# Patient Record
Sex: Female | Born: 1985 | Race: Black or African American | Hispanic: No | Marital: Single | Smoking: Current every day smoker
Health system: Southern US, Community
[De-identification: ages and names within clinical notes are randomized; demographics above are authoritative.]

## PROBLEM LIST (undated history)

## (undated) ENCOUNTER — Inpatient Hospital Stay (HOSPITAL_COMMUNITY): Payer: Self-pay

## (undated) DIAGNOSIS — F329 Major depressive disorder, single episode, unspecified: Secondary | ICD-10-CM

## (undated) DIAGNOSIS — IMO0001 Reserved for inherently not codable concepts without codable children: Secondary | ICD-10-CM

## (undated) DIAGNOSIS — D573 Sickle-cell trait: Secondary | ICD-10-CM

## (undated) DIAGNOSIS — G35 Multiple sclerosis: Secondary | ICD-10-CM

## (undated) DIAGNOSIS — O139 Gestational [pregnancy-induced] hypertension without significant proteinuria, unspecified trimester: Secondary | ICD-10-CM

## (undated) DIAGNOSIS — F32A Depression, unspecified: Secondary | ICD-10-CM

## (undated) HISTORY — DX: Reserved for inherently not codable concepts without codable children: IMO0001

## (undated) HISTORY — PX: FOOT SURGERY: SHX648

---
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
## 2006-06-12 ENCOUNTER — Inpatient Hospital Stay (HOSPITAL_COMMUNITY): Admission: AD | Admit: 2006-06-12 | Discharge: 2006-06-12 | Payer: Self-pay | Admitting: Obstetrics

## 2007-05-05 ENCOUNTER — Emergency Department (HOSPITAL_COMMUNITY): Admission: EM | Admit: 2007-05-05 | Discharge: 2007-05-05 | Payer: Self-pay | Admitting: Family Medicine

## 2007-12-21 ENCOUNTER — Emergency Department (HOSPITAL_COMMUNITY): Admission: EM | Admit: 2007-12-21 | Discharge: 2007-12-21 | Payer: Self-pay | Admitting: Emergency Medicine

## 2008-11-30 ENCOUNTER — Inpatient Hospital Stay (HOSPITAL_COMMUNITY): Admission: AD | Admit: 2008-11-30 | Discharge: 2008-11-30 | Payer: Self-pay | Admitting: Obstetrics

## 2009-02-03 ENCOUNTER — Emergency Department (HOSPITAL_COMMUNITY): Admission: EM | Admit: 2009-02-03 | Discharge: 2009-02-03 | Payer: Self-pay | Admitting: Emergency Medicine

## 2009-04-03 ENCOUNTER — Inpatient Hospital Stay (HOSPITAL_COMMUNITY): Admission: AD | Admit: 2009-04-03 | Discharge: 2009-04-03 | Payer: Self-pay | Admitting: Obstetrics

## 2010-05-17 ENCOUNTER — Emergency Department (HOSPITAL_COMMUNITY)
Admission: EM | Admit: 2010-05-17 | Discharge: 2010-05-17 | Disposition: A | Discharge status: Home / Self Care | Payer: Medicaid Other | Attending: Emergency Medicine | Admitting: Emergency Medicine

## 2010-05-17 DIAGNOSIS — N898 Other specified noninflammatory disorders of vagina: Secondary | ICD-10-CM | POA: Insufficient documentation

## 2010-05-17 DIAGNOSIS — R197 Diarrhea, unspecified: Secondary | ICD-10-CM | POA: Insufficient documentation

## 2010-05-17 LAB — DIFFERENTIAL
Basophils Absolute: 0 K/uL (ref 0.0–0.1)
Basophils Relative: 1 % (ref 0–1)
Eosinophils Absolute: 0 10*3/uL (ref 0.0–0.7)
Eosinophils Relative: 1 % (ref 0–5)
Lymphocytes Relative: 30 % (ref 12–46)
Lymphs Abs: 1 K/uL (ref 0.7–4.0)
Monocytes Absolute: 0.3 10*3/uL (ref 0.1–1.0)
Monocytes Relative: 8 % (ref 3–12)
Neutro Abs: 2 K/uL (ref 1.7–7.7)
Neutrophils Relative %: 61 % (ref 43–77)

## 2010-05-17 LAB — COMPREHENSIVE METABOLIC PANEL WITH GFR
ALT: 22 U/L (ref 0–35)
AST: 20 U/L (ref 0–37)
Albumin: 3.8 g/dL (ref 3.5–5.2)
Alkaline Phosphatase: 73 U/L (ref 39–117)
BUN: 9 mg/dL (ref 6–23)
CO2: 23 meq/L (ref 19–32)
Chloride: 106 meq/L (ref 96–112)
Creatinine, Ser: 0.76 mg/dL (ref 0.4–1.2)
GFR calc non Af Amer: >60 mL/min (ref >60)
Glucose, Bld: 94 mg/dL (ref 70–99)
Potassium: 3.7 meq/L (ref 3.5–5.1)
Total Protein: 7 g/dL (ref 6.0–8.3)

## 2010-05-17 LAB — CBC
HCT: 43.5 % (ref 36.0–46.0)
Hemoglobin: 14.7 g/dL (ref 12.0–15.0)
MCH: 29.4 pg (ref 26.0–34.0)
MCHC: 33.8 g/dL (ref 30.0–36.0)
MCV: 87 fL (ref 78.0–100.0)
Platelets: 266 10*3/uL (ref 150–400)
RBC: 5 MIL/uL (ref 3.87–5.11)
RDW: 13.7 % (ref 11.5–15.5)
WBC: 3.2 K/uL — ABNORMAL LOW (ref 4.0–10.5)

## 2010-05-17 LAB — LIPASE, BLOOD: Lipase: 26 U/L (ref 11–59)

## 2010-05-17 LAB — URINALYSIS, ROUTINE W REFLEX MICROSCOPIC
Bilirubin Urine: NEGATIVE
Glucose, UA: NEGATIVE mg/dL
Ketones, ur: NEGATIVE mg/dL
Nitrite: NEGATIVE
Protein, ur: NEGATIVE mg/dL
Specific Gravity, Urine: 1.017 (ref 1.005–1.030)
Urobilinogen, UA: 0.2 mg/dL (ref 0.0–1.0)
pH: 5.5 (ref 5.0–8.0)

## 2010-05-17 LAB — URINE MICROSCOPIC-ADD ON

## 2010-05-17 LAB — COMPREHENSIVE METABOLIC PANEL
Calcium: 8.8 mg/dL (ref 8.4–10.5)
GFR calc Af Amer: >60 mL/min (ref >60)
Sodium: 133 mEq/L — ABNORMAL LOW (ref 135–145)
Total Bilirubin: 0.4 mg/dL (ref 0.3–1.2)

## 2010-05-17 LAB — POCT PREGNANCY, URINE: Preg Test, Ur: NEGATIVE

## 2010-05-19 LAB — URINE CULTURE
Colony Count: 50000
Culture  Setup Time: 201203142047

## 2010-05-21 LAB — URINALYSIS, ROUTINE W REFLEX MICROSCOPIC
Bilirubin Urine: NEGATIVE
Specific Gravity, Urine: 1.015 (ref 1.005–1.030)
Urobilinogen, UA: 0.2 mg/dL (ref 0.0–1.0)

## 2010-05-21 LAB — URINE MICROSCOPIC-ADD ON

## 2010-05-21 LAB — WET PREP, GENITAL
Trich, Wet Prep: NONE SEEN
Yeast Wet Prep HPF POC: NONE SEEN

## 2010-05-21 LAB — GC/CHLAMYDIA PROBE AMP, GENITAL
Chlamydia, DNA Probe: NEGATIVE
GC Probe Amp, Genital: NEGATIVE

## 2010-06-06 LAB — URINALYSIS, ROUTINE W REFLEX MICROSCOPIC
Bilirubin Urine: NEGATIVE
Glucose, UA: NEGATIVE mg/dL
Specific Gravity, Urine: 1.013 (ref 1.005–1.030)
Urobilinogen, UA: 1 mg/dL (ref 0.0–1.0)

## 2010-06-09 LAB — URINALYSIS, ROUTINE W REFLEX MICROSCOPIC
Bilirubin Urine: NEGATIVE
Glucose, UA: NEGATIVE mg/dL
Nitrite: NEGATIVE
Specific Gravity, Urine: 1.015 (ref 1.005–1.030)
pH: 5.5 (ref 5.0–8.0)

## 2010-06-09 LAB — URINE MICROSCOPIC-ADD ON

## 2010-06-09 LAB — WET PREP, GENITAL: Yeast Wet Prep HPF POC: NONE SEEN

## 2010-06-09 LAB — GC/CHLAMYDIA PROBE AMP, GENITAL: GC Probe Amp, Genital: NEGATIVE

## 2010-07-10 ENCOUNTER — Emergency Department (HOSPITAL_COMMUNITY)
Admission: EM | Admit: 2010-07-10 | Discharge: 2010-07-10 | Disposition: A | Discharge status: Home / Self Care | Payer: Medicaid Other | Attending: Emergency Medicine | Admitting: Emergency Medicine

## 2010-07-10 DIAGNOSIS — B9689 Other specified bacterial agents as the cause of diseases classified elsewhere: Secondary | ICD-10-CM | POA: Insufficient documentation

## 2010-07-10 DIAGNOSIS — N760 Acute vaginitis: Secondary | ICD-10-CM | POA: Insufficient documentation

## 2010-07-10 DIAGNOSIS — A499 Bacterial infection, unspecified: Secondary | ICD-10-CM | POA: Insufficient documentation

## 2010-07-10 DIAGNOSIS — B3731 Acute candidiasis of vulva and vagina: Secondary | ICD-10-CM | POA: Insufficient documentation

## 2010-07-10 DIAGNOSIS — B373 Candidiasis of vulva and vagina: Secondary | ICD-10-CM | POA: Insufficient documentation

## 2010-07-10 LAB — POCT PREGNANCY, URINE: Preg Test, Ur: NEGATIVE

## 2010-07-10 LAB — WET PREP, GENITAL: Trich, Wet Prep: NONE SEEN

## 2010-07-21 NOTE — Op Note (Signed)
NAMETELICIA, Cheryl Bright                 ACCOUNT NO.:  [id number]   MEDICAL RECORD NO.:  [id number]          PATIENT TYPE:  INP   LOCATION:  9130                          FACILITY:  [facility]   PHYSICIAN:  Charles Bright. Clearance Coots, M.D.DATE OF BIRTH:  10-22-1985   DATE OF PROCEDURE:  07/01/2004  DATE OF DISCHARGE:                                 OPERATIVE REPORT   PREOPERATIVE DIAGNOSES:  1.  36-week gestation.  2.  Active labor.  3.  Breech presentation.   POSTOPERATIVE DIAGNOSES:  1.  36-week gestation.  2.  Active labor.  3.  Breech presentation.   PROCEDURE:  Primary low transverse cesarean section.   SURGEON:  Charles Bright. Clearance Coots, M.D.   ANESTHESIA:  Spinal.   ESTIMATED BLOOD LOSS:  800 mL.   IV FLUIDS:  2800 mL.   URINE OUTPUT:  200 mL.   COMPLICATIONS:  None.   DRAINS:  Foley to gravity, clear.   FINDINGS:  Viable female at 70 with Apgars of 7 at one minute and 9 at five  minutes, weight of 5 pounds 13 ounces.  Normal uterus, ovaries, and  fallopian tubes.   DESCRIPTION OF PROCEDURE:  The patient was brought to the operating room and  after satisfactory spinal anesthesia, the abdomen was prepped and draped in  the usual sterile fashion.   Bright Pfannenstiel skin incision was made with Bright scalpel that was deepened down  to the fascia with Bright scalpel.  The fascia was nicked in the midline, and the  fascial incision was extended to the left and to the right with Bright pair of  Mayo scissors.  The superior and inferior fascial edges were taken off of  the rectus muscles with blunt and sharp dissection.  The rectus muscles were  then divided in the midline both bluntly and sharply.  The peritoneum was  entered digitally and was digitally extended to the left and to the right.  The bladder blade was positioned, and the vesicouterine fold of peritoneum  above the urinary bladder was grasped with forceps and was incised and  undermined with Metzenbaum scissors.  The incision was extended to the  left  and to the right with the Metzenbaum scissors.  The bladder flap was  developed, and the bladder blade was repositioned in front of the urinary  bladder, placing it well out of the operative field.  The uterus was then  entered in the lower uterine segment transversely with the scalpel.  The  uterine incision was then extended to the left and to the right digitally.  The delivery was then accomplished as Bright frank breech delivery in routine  fashion.  The infant's mouth and nose were suctioned with the suction bulb,  and the umbilical cord was doubly clamped and cut.  The infant was handed  off to the nursery staff.  Cord blood was obtained, and the placenta was  spontaneously expelled from the uterine cavity intact.  The edges of the  uterine incision were grasped with ring forceps, and the uterus was closed  with Bright continuous interlocking suture of 0 Monocryl.  Hemostasis was  excellent.  The pelvic cavity was thoroughly irrigated with warm saline  solution, and all clots were removed.  The abdomen was then closed as  follows.  The peritoneum was closed with continuous suture of 2-0 Vicryl.  The fascia was closed with continuous suture of 0 PDS from each corner to  the center.  The subcutaneous tissue was thoroughly irrigated with warm  saline solution, and all areas of subcutaneous bleeding were coagulated with  the Bovie.  The skin was then closed with Bright continuous subcuticular suture  of 3-0 Monocryl.  Bright sterile bandage was applied to the incision closure.  The surgical technician indicated that all needle, sponge, and instrument  counts were correct x2.   The patient tolerated the procedure well and was transported to the recovery  room in satisfactory condition.      CAH/MEDQ  D:  07/01/2004  T:  07/02/2004  Job:  [id number]

## 2010-07-21 NOTE — Discharge Summary (Signed)
NAME:  Cheryl Bright, Cheryl Bright                           ACCOUNT NO.:  [id number]   MEDICAL RECORD NO.:  [id number]                   PATIENT TYPE:  INP   LOCATION:  6119                                 FACILITY:  [facility]   PHYSICIAN:  Almedia Balls. Ranell Patrick, M.D.              DATE OF BIRTH:  Jan 10, 1986   DATE OF ADMISSION:  10/13/2003  DATE OF DISCHARGE:  10/16/2003                                 DISCHARGE SUMMARY   ADMISSION DIAGNOSIS:  Displaced trimalleolar ankle fracture.   DISCHARGE DIAGNOSIS:  Displaced trimalleolar ankle fracture.   CONSULTING SERVICES:  Physical therapy.   DISCHARGE PLANNING:   HISTORY OF PRESENT ILLNESS:  The patient is a 25 year old female, status  post injury to the patient's right ankle.  The patient sustained an ankle  fracture-dislocation.  She presented to [facility] emergency room where she  had x-rays done, demonstrating a displaced trimalleolar ankle fracture.  For  further details and the patient's past medical history or physical  examination, please see the medical record.   HOSPITAL COURSE:  The patient was admitted to Orthopaedics.  Was taken to  surgery at 1105 for immediate open reduction-internal fixation of the right  ankle fracture.  The patient was taken postoperatively to the floor, where  she had an uncomplicated course.  Discharge planning involved physical  therapy for strict non-weight-bearing of the right lower extremity. She will  follow up with [place] in a week.  She needs to elevate her leg.  She can bathe.  Muscle relaxers.  Instructions to call us for any fevers or chills.                                                Almedia Balls. Ranell Patrick, M.D.    SRN/MEDQ  D:  11/13/2003  T:  11/14/2003  Job:  [id number]

## 2010-07-21 NOTE — Discharge Summary (Signed)
NAMEHARLEE, ECKROTH                 ACCOUNT NO.:  [id number]   MEDICAL RECORD NO.:  [id number]          PATIENT TYPE:  INP   LOCATION:  9101                          FACILITY:  [facility]   PHYSICIAN:  Kathreen Cosier, M.D.DATE OF BIRTH:  [birth year]   DATE OF ADMISSION:  07/01/2004  DATE OF DISCHARGE:                                 DISCHARGE SUMMARY   HOSPITAL COURSE:  The patient is an 25 year old gravida 2 para 1-0-0-1 whose  EDC was Jul 25, 2004. She was then admitted in labor on April 29 at 36 weeks  with a breech presentation and underwent primary low transverse cesarean  section. She had a female, Apgars 7 and 9, weighing 5 pounds 13 ounces. On  admission, her urine was negative, hemoglobin was 10.6, white count 8.1, RPR  was negative. Postoperatively, her hemoglobin was 9.9, platelets 294, white  count 10.9. She did well and was discharged home on postoperative day #3  ambulatory, on a regular diet, to see me in 6 weeks.   DISCHARGE DIAGNOSIS:  Status post primary low transverse cesarean section at  36 weeks for breech presentation in labor.      BAM/MEDQ  D:  07/04/2004  T:  07/04/2004  Job:  [id number]
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
## 2010-09-05 ENCOUNTER — Emergency Department (HOSPITAL_COMMUNITY)
Admission: EM | Admit: 2010-09-05 | Discharge: 2010-09-05 | Disposition: A | Discharge status: Home / Self Care | Payer: Medicaid Other | Attending: Emergency Medicine | Admitting: Emergency Medicine

## 2010-09-05 DIAGNOSIS — A499 Bacterial infection, unspecified: Secondary | ICD-10-CM | POA: Insufficient documentation

## 2010-09-05 DIAGNOSIS — B3731 Acute candidiasis of vulva and vagina: Secondary | ICD-10-CM | POA: Insufficient documentation

## 2010-09-05 DIAGNOSIS — B9689 Other specified bacterial agents as the cause of diseases classified elsewhere: Secondary | ICD-10-CM | POA: Insufficient documentation

## 2010-09-05 DIAGNOSIS — N390 Urinary tract infection, site not specified: Secondary | ICD-10-CM | POA: Insufficient documentation

## 2010-09-05 DIAGNOSIS — N760 Acute vaginitis: Secondary | ICD-10-CM | POA: Insufficient documentation

## 2010-09-05 DIAGNOSIS — B373 Candidiasis of vulva and vagina: Secondary | ICD-10-CM | POA: Insufficient documentation

## 2010-09-05 LAB — URINALYSIS, ROUTINE W REFLEX MICROSCOPIC
Bilirubin Urine: NEGATIVE
Nitrite: NEGATIVE
Specific Gravity, Urine: 1.013 (ref 1.005–1.030)
pH: 7 (ref 5.0–8.0)

## 2010-09-05 LAB — URINE MICROSCOPIC-ADD ON

## 2010-09-05 LAB — WET PREP, GENITAL

## 2010-09-05 LAB — POCT PREGNANCY, URINE: Preg Test, Ur: NEGATIVE

## 2010-09-06 LAB — GC/CHLAMYDIA PROBE AMP, GENITAL: GC Probe Amp, Genital: NEGATIVE

## 2010-11-27 LAB — POCT RAPID STREP A: Streptococcus, Group A Screen (Direct): NEGATIVE

## 2010-12-05 LAB — DIFFERENTIAL
Basophils Absolute: 0
Eosinophils Absolute: 0.1
Eosinophils Relative: 1
Lymphocytes Relative: 13
Monocytes Absolute: 0.8

## 2010-12-05 LAB — URINALYSIS, ROUTINE W REFLEX MICROSCOPIC
Bilirubin Urine: NEGATIVE
Ketones, ur: NEGATIVE
Leukocytes, UA: NEGATIVE
Nitrite: NEGATIVE
Protein, ur: NEGATIVE
pH: 6

## 2010-12-05 LAB — COMPREHENSIVE METABOLIC PANEL
ALT: 12
AST: 14
Albumin: 3.5
Alkaline Phosphatase: 72
CO2: 25
Chloride: 105
GFR calc Af Amer: >60
GFR calc non Af Amer: >60
Potassium: 4
Sodium: 135
Total Bilirubin: 0.6

## 2010-12-05 LAB — URINE MICROSCOPIC-ADD ON

## 2010-12-05 LAB — WET PREP, GENITAL: Trich, Wet Prep: NONE SEEN

## 2010-12-05 LAB — CBC
MCV: 87.4
Platelets: 331
RBC: 4.24
WBC: 11.4 — ABNORMAL HIGH

## 2010-12-05 LAB — POCT PREGNANCY, URINE: Preg Test, Ur: NEGATIVE

## 2010-12-05 LAB — GC/CHLAMYDIA PROBE AMP, GENITAL: Chlamydia, DNA Probe: NEGATIVE

## 2011-07-18 ENCOUNTER — Emergency Department (HOSPITAL_COMMUNITY)
Admission: EM | Admit: 2011-07-18 | Discharge: 2011-07-18 | Discharge status: Against Medical Advice | Payer: Medicaid Other | Attending: Emergency Medicine | Admitting: Emergency Medicine

## 2011-07-18 ENCOUNTER — Encounter (HOSPITAL_COMMUNITY): Payer: Self-pay | Admitting: *Deleted

## 2011-07-18 DIAGNOSIS — R109 Unspecified abdominal pain: Secondary | ICD-10-CM | POA: Insufficient documentation

## 2011-07-18 LAB — URINE MICROSCOPIC-ADD ON

## 2011-07-18 LAB — URINALYSIS, ROUTINE W REFLEX MICROSCOPIC
Bilirubin Urine: NEGATIVE
Ketones, ur: NEGATIVE mg/dL
Specific Gravity, Urine: 1.013 (ref 1.005–1.030)
pH: 7 (ref 5.0–8.0)

## 2011-07-18 NOTE — ED Provider Notes (Signed)
Multiple attempts to locate patient without success.  Unable to evaluate pt as pt not present in room.  Fayrene Helper, PA-C 07/18/11 [id number]

## 2011-07-18 NOTE — ED Notes (Signed)
Attempted to call her cell phone with no answer,  Invalid number

## 2011-07-18 NOTE — ED Notes (Signed)
Name called no answer 

## 2011-07-18 NOTE — ED Notes (Signed)
Rt lower abd pain and she says she has some green drainage from both breasts for 2 days.  lmp april

## 2011-07-19 ENCOUNTER — Encounter (HOSPITAL_COMMUNITY): Payer: Self-pay | Admitting: *Deleted

## 2011-07-19 ENCOUNTER — Inpatient Hospital Stay (HOSPITAL_COMMUNITY)
Admission: AD | Admit: 2011-07-19 | Discharge: 2011-07-19 | Disposition: A | Discharge status: Home / Self Care | Payer: Medicaid Other | Source: Ambulatory Visit | Attending: Obstetrics | Admitting: Obstetrics

## 2011-07-19 DIAGNOSIS — N949 Unspecified condition associated with female genital organs and menstrual cycle: Secondary | ICD-10-CM | POA: Insufficient documentation

## 2011-07-19 DIAGNOSIS — N6452 Nipple discharge: Secondary | ICD-10-CM

## 2011-07-19 DIAGNOSIS — N926 Irregular menstruation, unspecified: Secondary | ICD-10-CM | POA: Insufficient documentation

## 2011-07-19 DIAGNOSIS — N643 Galactorrhea not associated with childbirth: Secondary | ICD-10-CM | POA: Insufficient documentation

## 2011-07-19 DIAGNOSIS — R1031 Right lower quadrant pain: Secondary | ICD-10-CM | POA: Insufficient documentation

## 2011-07-19 DIAGNOSIS — N6459 Other signs and symptoms in breast: Secondary | ICD-10-CM

## 2011-07-19 DIAGNOSIS — R102 Pelvic and perineal pain: Secondary | ICD-10-CM

## 2011-07-19 LAB — URINALYSIS, ROUTINE W REFLEX MICROSCOPIC
Bilirubin Urine: NEGATIVE
Ketones, ur: NEGATIVE mg/dL
Leukocytes, UA: NEGATIVE
Nitrite: NEGATIVE
Protein, ur: NEGATIVE mg/dL
Urobilinogen, UA: 0.2 mg/dL (ref 0.0–1.0)

## 2011-07-19 LAB — POCT PREGNANCY, URINE: Preg Test, Ur: NEGATIVE

## 2011-07-19 LAB — WET PREP, GENITAL: Yeast Wet Prep HPF POC: NONE SEEN

## 2011-07-19 LAB — URINE MICROSCOPIC-ADD ON

## 2011-07-19 MED ORDER — METRONIDAZOLE 500 MG PO TABS
2000.0000 mg | ORAL_TABLET | Freq: Once | ORAL | Status: AC
  Administered 2011-07-19: 2000 mg via ORAL
  Filled 2011-07-19: qty 4

## 2011-07-19 NOTE — MAU Note (Signed)
Both breasts are tender noticed milk like substance from left breast and greenish drainage from right breast, LMP 07/01/11 having some stomach pain.

## 2011-07-19 NOTE — MAU Provider Note (Signed)
History     CSN: [id number]  Arrival date and time: 07/19/11 1048   None     Chief Complaint  Patient presents with  . Breast Pain   HPI 26 y.o. [name] with breast discharge, left breast white, right breast greenish, only comes out with manual expression, slight tenderness in breast, no masses or redness. RLQ abd pain x a few days, comes and goes. Hasn't tried any pain meds, "just move around and try to get my mind off of it". No vaginal bleeding or discharge. Patient's last menstrual period was 06/23/2011. States that periods are irregular, occuring about every 3 months.    Past Medical History  Diagnosis Date  . No pertinent past medical history     Past Surgical History  Procedure Date  . Foot surgery   . Cesarean section     History reviewed. No pertinent family history.  History  Substance Use Topics  . Smoking status: Current Everyday Smoker -- 0.2 packs/day  . Smokeless tobacco: Not on file  . Alcohol Use: Yes     several times per month    Allergies:  Allergies  Allergen Reactions  . Sulfa Drugs Cross Reactors Other (See Comments)    Swelling and hives    Prescriptions prior to admission  Medication Sig Dispense Refill  . Multiple Vitamin (MULITIVITAMIN WITH MINERALS) TABS Take 1 tablet by mouth daily.        Review of Systems  Constitutional: Negative.   Respiratory: Negative.   Cardiovascular: Negative.   Gastrointestinal: Positive for abdominal pain. Negative for nausea, vomiting, diarrhea and constipation.  Genitourinary: Negative for dysuria, urgency, frequency, hematuria and flank pain.       Negative for vaginal bleeding, vaginal discharge, dyspareunia  Musculoskeletal: Negative.   Neurological: Negative.   Psychiatric/Behavioral: Negative.    Physical Exam   Blood pressure 136/79, pulse 75, temperature 99.4 F (37.4 C), temperature source Oral, resp. rate 16, height 5\' 2"  (1.575 m), weight 208 lb 6.4 oz (94.53 kg), last menstrual  period 06/23/2011.  Physical Exam  Nursing note and vitals reviewed. Constitutional: She is oriented to person, place, and time. She appears well-developed and well-nourished. No distress.  Cardiovascular: Normal rate.   Respiratory: Effort normal.  GI: Soft. She exhibits no mass. There is tenderness (bilateral lower quadrants). There is no rebound and no guarding.  Genitourinary: There is breast discharge (able to express drop of milky fluid from left breast). No breast swelling, tenderness or bleeding.  Musculoskeletal: Normal range of motion.  Neurological: She is alert and oriented to person, place, and time.  Skin: Skin is warm and dry.       Facial hirsutism   Psychiatric: She has a normal mood and affect.    MAU Course  Procedures Results for orders placed during the hospital encounter of 07/19/11 (from the past 24 hour(s))  URINALYSIS, ROUTINE W REFLEX MICROSCOPIC     Status: Abnormal   Collection Time   07/19/11 11:00 AM      Component Value Range   Color, Urine YELLOW  YELLOW    APPearance CLEAR  CLEAR    Specific Gravity, Urine 1.015  1.005 - 1.030    pH 6.0  5.0 - 8.0    Glucose, UA NEGATIVE  NEGATIVE (mg/dL)   Hgb urine dipstick SMALL (*) NEGATIVE    Bilirubin Urine NEGATIVE  NEGATIVE    Ketones, ur NEGATIVE  NEGATIVE (mg/dL)   Protein, ur NEGATIVE  NEGATIVE (mg/dL)   Urobilinogen, UA 0.2  0.0 - 1.0 (mg/dL)   Nitrite NEGATIVE  NEGATIVE    Leukocytes, UA NEGATIVE  NEGATIVE   URINE MICROSCOPIC-ADD ON     Status: Abnormal   Collection Time   07/19/11 11:00 AM      Component Value Range   Squamous Epithelial / LPF FEW (*) RARE    WBC, UA 0-2  <3 (WBC/hpf)   RBC / HPF 3-6  <3 (RBC/hpf)   Bacteria, UA MANY (*) RARE   POCT PREGNANCY, URINE     Status: Normal   Collection Time   07/19/11 11:10 AM      Component Value Range   Preg Test, Ur NEGATIVE  NEGATIVE   WET PREP, GENITAL     Status: Abnormal   Collection Time   07/19/11 11:45 AM      Component Value Range    Yeast Wet Prep HPF POC NONE SEEN  NONE SEEN    Trich, Wet Prep FEW (*) NONE SEEN    Clue Cells Wet Prep HPF POC FEW (*) NONE SEEN    WBC, Wet Prep HPF POC FEW (*) NONE SEEN    Pt declines any pain medication at this time  Assessment and Plan  26 y.o. [id number] with galactorrhea, pelvic pain, irregular periods Trich on wet prep today - treated with Flagyl 2000 mg in MAU F/U with Dr. Gaynell Face for further eval  Cheryl Bright 07/19/2011, 12:33 PM

## 2011-07-19 NOTE — ED Provider Notes (Signed)
Medical screening examination/treatment/procedure(s) were performed by non-physician practitioner and as supervising physician I was immediately available for consultation/collaboration.  Juliet Rude. Rubin Payor, MD 07/19/11 1610

## 2011-07-20 LAB — GC/CHLAMYDIA PROBE AMP, GENITAL: Chlamydia, DNA Probe: NEGATIVE

## 2011-07-20 NOTE — MAU Provider Note (Signed)
Agree with above note.  Bridget Petroleum 07/20/2011 9:02 AM   

## 2011-11-07 ENCOUNTER — Encounter (HOSPITAL_COMMUNITY): Payer: Self-pay | Admitting: *Deleted

## 2011-11-07 ENCOUNTER — Emergency Department (HOSPITAL_COMMUNITY)
Admission: EM | Admit: 2011-11-07 | Discharge: 2011-11-07 | Disposition: A | Discharge status: Home / Self Care | Payer: Medicaid Other | Attending: Emergency Medicine | Admitting: Emergency Medicine

## 2011-11-07 DIAGNOSIS — M549 Dorsalgia, unspecified: Secondary | ICD-10-CM

## 2011-11-07 DIAGNOSIS — F17200 Nicotine dependence, unspecified, uncomplicated: Secondary | ICD-10-CM | POA: Insufficient documentation

## 2011-11-07 DIAGNOSIS — Z882 Allergy status to sulfonamides status: Secondary | ICD-10-CM | POA: Insufficient documentation

## 2011-11-07 MED ORDER — IBUPROFEN 800 MG PO TABS: 800.0000 mg | ORAL_TABLET | Freq: Three times a day (TID) | ORAL | Status: DC

## 2011-11-07 NOTE — ED Provider Notes (Signed)
Medical screening examination/treatment/procedure(s) were performed by non-physician practitioner and as supervising physician I was immediately available for consultation/collaboration.   Henryk Ursin, MD 11/07/11 1508 
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
## 2011-11-08 ENCOUNTER — Emergency Department (HOSPITAL_COMMUNITY)
Admission: EM | Admit: 2011-11-08 | Discharge: 2011-11-08 | Disposition: A | Discharge status: Home / Self Care | Payer: No Typology Code available for payment source | Attending: Emergency Medicine | Admitting: Emergency Medicine

## 2011-11-08 ENCOUNTER — Encounter (HOSPITAL_COMMUNITY): Payer: Self-pay | Admitting: Emergency Medicine

## 2011-11-08 DIAGNOSIS — I4902 Ventricular flutter: Secondary | ICD-10-CM | POA: Insufficient documentation

## 2011-11-08 NOTE — ED Notes (Signed)
EKG given to Dr. Sheldon 

## 2011-11-08 NOTE — ED Notes (Signed)
Pt not in waiting room

## 2011-11-08 NOTE — ED Notes (Signed)
Heart started fluttering yesterday -- was not doing anything that was different. A/Ox3, w/d, no apparent distress.

## 2012-06-11 ENCOUNTER — Emergency Department (HOSPITAL_COMMUNITY): Payer: Medicaid Other

## 2012-06-11 ENCOUNTER — Emergency Department (HOSPITAL_COMMUNITY)
Admission: EM | Admit: 2012-06-11 | Discharge: 2012-06-11 | Disposition: A | Discharge status: Home / Self Care | Payer: Medicaid Other | Attending: Emergency Medicine | Admitting: Emergency Medicine

## 2012-06-11 ENCOUNTER — Encounter (HOSPITAL_COMMUNITY): Payer: Self-pay

## 2012-06-11 DIAGNOSIS — Y9301 Activity, walking, marching and hiking: Secondary | ICD-10-CM | POA: Insufficient documentation

## 2012-06-11 DIAGNOSIS — S93409A Sprain of unspecified ligament of unspecified ankle, initial encounter: Secondary | ICD-10-CM | POA: Insufficient documentation

## 2012-06-11 DIAGNOSIS — Z9889 Other specified postprocedural states: Secondary | ICD-10-CM | POA: Insufficient documentation

## 2012-06-11 DIAGNOSIS — F17200 Nicotine dependence, unspecified, uncomplicated: Secondary | ICD-10-CM | POA: Insufficient documentation

## 2012-06-11 DIAGNOSIS — X500XXA Overexertion from strenuous movement or load, initial encounter: Secondary | ICD-10-CM | POA: Insufficient documentation

## 2012-06-11 DIAGNOSIS — S93402A Sprain of unspecified ligament of left ankle, initial encounter: Secondary | ICD-10-CM

## 2012-06-11 DIAGNOSIS — Y92009 Unspecified place in unspecified non-institutional (private) residence as the place of occurrence of the external cause: Secondary | ICD-10-CM | POA: Insufficient documentation

## 2012-06-11 MED ORDER — IBUPROFEN 800 MG PO TABS: 800.0000 mg | ORAL_TABLET | Freq: Four times a day (QID) | ORAL | Status: DC | PRN

## 2012-06-11 MED ORDER — OXYCODONE-ACETAMINOPHEN 5-325 MG PO TABS
1.0000 | ORAL_TABLET | Freq: Once | ORAL | Status: AC
  Administered 2012-06-11: 1 via ORAL
  Filled 2012-06-11: qty 1

## 2012-06-11 MED ORDER — IBUPROFEN 800 MG PO TABS
800.0000 mg | ORAL_TABLET | Freq: Once | ORAL | Status: AC
  Administered 2012-06-11: 800 mg via ORAL
  Filled 2012-06-11: qty 1

## 2012-06-11 MED ORDER — HYDROCODONE-IBUPROFEN 5-200 MG PO TABS: 1.0000 | ORAL_TABLET | Freq: Three times a day (TID) | ORAL | Status: DC | PRN

## 2012-06-11 NOTE — ED Provider Notes (Signed)
Medical screening examination/treatment/procedure(s) were performed by non-physician practitioner and as supervising physician I was immediately available for consultation/collaboration.  Sunnie Nielsen, MD 06/11/12 [phone]

## 2012-06-11 NOTE — ED Provider Notes (Signed)
History     CSN: [id number]  Arrival date & time 06/11/12  0143   First MD Initiated Contact with Patient 06/11/12 [phone]      Chief Complaint  Patient presents with  . Ankle Pain    (Consider location/radiation/quality/duration/timing/severity/associated sxs/prior treatment) HPI Comments: Patient states she was walking outside her home.  When she twisted her left ankle, falling is any other injuries.  Left ankle is painful, and swollen.  She has not taken any over-the-counter medication.  Prior to arrival.  For discomfort.  No previous history of ankle injury  Patient is a 27 y.o. female presenting with ankle pain. The history is provided by the patient.  Ankle Pain Location:  Ankle Time since incident:  4 hours Injury: yes   Mechanism of injury: fall   Fall:    Fall occurred:  Walking Associated symptoms: no fever     Past Medical History  Diagnosis Date  . No pertinent past medical history     Past Surgical History  Procedure Laterality Date  . Foot surgery    . Cesarean section      History reviewed. No pertinent family history.  History  Substance Use Topics  . Smoking status: Current Every Day Smoker -- 0.25 packs/day  . Smokeless tobacco: Not on file  . Alcohol Use: Yes     Comment: several times per month    OB History   Grav Para Term Preterm Abortions TAB SAB Ect Mult Living   3 2 2  1  1   2       Review of Systems  Constitutional: Negative for fever.  Musculoskeletal: Positive for joint swelling.  Skin: Negative for wound.  All other systems reviewed and are negative.    Allergies  Sulfa drugs cross reactors  Home Medications   Current Outpatient Rx  Name  Route  Sig  Dispense  Refill  . hydrocodone-ibuprofen (VICOPROFEN) 5-200 MG per tablet   Oral   Take 1 tablet by mouth every 8 (eight) hours as needed for pain.   30 tablet   0   . ibuprofen (ADVIL,MOTRIN) 800 MG tablet   Oral   Take 1 tablet (800 mg total) by mouth every 6 (six)  hours as needed for pain.   30 tablet   0   . Multiple Vitamin (MULITIVITAMIN WITH MINERALS) TABS   Oral   Take 1 tablet by mouth daily.           BP 127/80  Pulse 108  Temp(Src) 98.1 F (36.7 C) (Oral)  Resp 18  Ht 5\' 2"  (1.575 m)  Wt 215 lb (97.523 kg)  BMI 39.31 kg/m2  SpO2 99%  LMP 04/13/2012  Physical Exam  Nursing note and vitals reviewed. Constitutional: She is oriented to person, place, and time. She appears well-developed and well-nourished.  HENT:  Head: Normocephalic.  Eyes: Pupils are equal, round, and reactive to light.  Neck: Normal range of motion.  Cardiovascular: Normal rate.   Pulmonary/Chest: Effort normal.  Musculoskeletal: Normal range of motion.  Neurological: She is alert and oriented to person, place, and time.  Skin: Skin is warm.    ED Course  Procedures (including critical care time)  Labs Reviewed - No data to display Dg Ankle Complete Left  06/11/2012  *RADIOLOGY REPORT*  Clinical Data: Twisting injury to left ankle, with lateral ankle pain and swelling.  LEFT ANKLE COMPLETE - 3+ VIEW  Comparison: None  Findings: There is no evidence of fracture or dislocation.  The ankle mortise is intact; the interosseous space is within normal limits.  No talar tilt or subluxation is seen.  The joint spaces are preserved.  Mild lateral soft tissue swelling is noted.  IMPRESSION: No evidence of fracture or dislocation.   Original Report Authenticated By: Tonia Ghent, M.D.      1. Left ankle sprain, initial encounter       MDM  X-ray, reviewed.  There is no fracture or subluxation.  Patient was placed in an ASO and crutches.  Referral to orthopedics to monitor healing.  Pain control         Arman Filter, NP 06/11/12 0330  Arman Filter, NP 06/11/12 0330  Arman Filter, NP 06/11/12 0330

## 2012-06-11 NOTE — ED Notes (Signed)
Pt states she was coming outside of home and twisted ankle while falling.  Pt states no other injuries.  Pt left ankle is swollen.

## 2012-08-09 ENCOUNTER — Encounter (HOSPITAL_COMMUNITY): Payer: Self-pay | Admitting: *Deleted

## 2012-08-09 ENCOUNTER — Inpatient Hospital Stay (HOSPITAL_COMMUNITY)
Admission: AD | Admit: 2012-08-09 | Discharge: 2012-08-09 | Disposition: A | Discharge status: Home / Self Care | Payer: Medicaid Other | Source: Ambulatory Visit | Attending: Family Medicine | Admitting: Family Medicine

## 2012-08-09 DIAGNOSIS — A499 Bacterial infection, unspecified: Secondary | ICD-10-CM

## 2012-08-09 DIAGNOSIS — B9689 Other specified bacterial agents as the cause of diseases classified elsewhere: Secondary | ICD-10-CM | POA: Insufficient documentation

## 2012-08-09 DIAGNOSIS — N911 Secondary amenorrhea: Secondary | ICD-10-CM

## 2012-08-09 DIAGNOSIS — R109 Unspecified abdominal pain: Secondary | ICD-10-CM | POA: Insufficient documentation

## 2012-08-09 DIAGNOSIS — N760 Acute vaginitis: Secondary | ICD-10-CM | POA: Insufficient documentation

## 2012-08-09 DIAGNOSIS — N912 Amenorrhea, unspecified: Secondary | ICD-10-CM | POA: Insufficient documentation

## 2012-08-09 LAB — URINALYSIS, ROUTINE W REFLEX MICROSCOPIC
Bilirubin Urine: NEGATIVE
Glucose, UA: NEGATIVE mg/dL
Ketones, ur: NEGATIVE mg/dL
Nitrite: NEGATIVE
Protein, ur: NEGATIVE mg/dL
pH: 5.5 (ref 5.0–8.0)

## 2012-08-09 LAB — WET PREP, GENITAL
Trich, Wet Prep: NONE SEEN
Yeast Wet Prep HPF POC: NONE SEEN

## 2012-08-09 LAB — URINE MICROSCOPIC-ADD ON

## 2012-08-09 LAB — CBC
Platelets: 290 10*3/uL (ref 150–400)
RBC: 4.65 MIL/uL (ref 3.87–5.11)
RDW: 14 % (ref 11.5–15.5)
WBC: 7.4 10*3/uL (ref 4.0–10.5)

## 2012-08-09 MED ORDER — METRONIDAZOLE 500 MG PO TABS: 500.0000 mg | ORAL_TABLET | Freq: Two times a day (BID) | ORAL | Status: DC

## 2012-08-09 NOTE — MAU Provider Note (Signed)
History     CSN: [id number]  Arrival date and time: 08/09/12 1140   None     Chief Complaint  Patient presents with  . Abdominal Pain   HPI 27 y.o. [name] with low abd pain since March. Pain is crampy and intermittent, but occurs daily, throughout each day. No bleeding or discharge. Patient's last menstrual period was 04/21/2012. H/O irregular periods, sometimes goes up to 3 months with no period.    Past Medical History  Diagnosis Date  . No pertinent past medical history     Past Surgical History  Procedure Laterality Date  . Foot surgery    . Cesarean section      No family history on file.  History  Substance Use Topics  . Smoking status: Current Every Day Smoker -- 0.25 packs/day  . Smokeless tobacco: Not on file  . Alcohol Use: Yes     Comment: several times per month    Allergies:  Allergies  Allergen Reactions  . Sulfa Drugs Cross Reactors Hives and Swelling    No prescriptions prior to admission    Review of Systems  Constitutional: Negative.   Respiratory: Negative.   Cardiovascular: Negative.   Gastrointestinal: Positive for abdominal pain. Negative for nausea, vomiting, diarrhea and constipation.  Genitourinary: Negative for dysuria, urgency, frequency, hematuria and flank pain.       Negative for vaginal bleeding, vaginal discharge, dyspareunia  Musculoskeletal: Negative.   Neurological: Negative.   Psychiatric/Behavioral: Negative.    Physical Exam   Blood pressure 133/80, pulse 70, temperature 98.1 F (36.7 C), resp. rate 18, height 5\' 2"  (1.575 m), weight 214 lb 9.6 oz (97.342 kg), last menstrual period 04/21/2012.  Physical Exam  Nursing note and vitals reviewed. Constitutional: She is oriented to person, place, and time. She appears well-developed and well-nourished. No distress.  HENT:  Head: Normocephalic and atraumatic.  Cardiovascular: Normal rate.   Respiratory: Effort normal. No respiratory distress.  GI: Soft. Bowel  sounds are normal. She exhibits no distension and no mass. There is no tenderness. There is no rebound and no guarding.  Genitourinary: There is no rash or lesion on the right labia. There is no rash or lesion on the left labia. Uterus is not deviated, not enlarged, not fixed and not tender. Cervix exhibits no motion tenderness, no discharge and no friability. Right adnexum displays no mass, no tenderness and no fullness. Left adnexum displays no mass, no tenderness and no fullness. No erythema, tenderness or bleeding around the vagina. Vaginal discharge (white) found.  Neurological: She is alert and oriented to person, place, and time.  Skin: Skin is warm and dry.  Psychiatric: She has a normal mood and affect.    MAU Course  Procedures Results for orders placed during the hospital encounter of 08/09/12 (from the past 72 hour(s))  URINALYSIS, ROUTINE W REFLEX MICROSCOPIC     Status: Abnormal   Collection Time    08/09/12 11:50 AM      Result Value Range   Color, Urine YELLOW  YELLOW   APPearance CLEAR  CLEAR   Specific Gravity, Urine 1.025  1.005 - 1.030   pH 5.5  5.0 - 8.0   Glucose, UA NEGATIVE  NEGATIVE mg/dL   Hgb urine dipstick MODERATE (*) NEGATIVE   Bilirubin Urine NEGATIVE  NEGATIVE   Ketones, ur NEGATIVE  NEGATIVE mg/dL   Protein, ur NEGATIVE  NEGATIVE mg/dL   Urobilinogen, UA 0.2  0.0 - 1.0 mg/dL   Nitrite NEGATIVE  NEGATIVE  Leukocytes, UA TRACE (*) NEGATIVE  URINE MICROSCOPIC-ADD ON     Status: Abnormal   Collection Time    08/09/12 11:50 AM      Result Value Range   Squamous Epithelial / LPF FEW (*) RARE   WBC, UA 0-2  <3 WBC/hpf   RBC / HPF 3-6  <3 RBC/hpf  POCT PREGNANCY, URINE     Status: None   Collection Time    08/09/12 12:00 PM      Result Value Range   Preg Test, Ur NEGATIVE  NEGATIVE   Comment:            THE SENSITIVITY OF THIS     METHODOLOGY IS >24 mIU/mL  CBC     Status: None   Collection Time    08/09/12 12:19 PM      Result Value Range   WBC  7.4  4.0 - 10.5 K/uL   RBC 4.65  3.87 - 5.11 MIL/uL   Hemoglobin 13.9  12.0 - 15.0 g/dL   HCT 16.1  09.6 - 04.5 %   MCV 86.0  78.0 - 100.0 fL   MCH 29.9  26.0 - 34.0 pg   MCHC 34.8  30.0 - 36.0 g/dL   RDW 40.9  81.1 - 91.4 %   Platelets 290  150 - 400 K/uL  WET PREP, GENITAL     Status: Abnormal   Collection Time    08/09/12 12:25 PM      Result Value Range   Yeast Wet Prep HPF POC NONE SEEN  NONE SEEN   Trich, Wet Prep NONE SEEN  NONE SEEN   Clue Cells Wet Prep HPF POC MODERATE (*) NONE SEEN   WBC, Wet Prep HPF POC FEW (*) NONE SEEN   Comment: MODERATE BACTERIA SEEN     Assessment and Plan   1. BV (bacterial vaginosis)   2. Amenorrhea, secondary   Flagyl for BV F/u in clinic regarding amenorrhea     Medication List    TAKE these medications       ibuprofen 800 MG tablet  Commonly known as:  ADVIL,MOTRIN  Take 1 tablet (800 mg total) by mouth every 6 (six) hours as needed for pain.     metroNIDAZOLE 500 MG tablet  Commonly known as:  FLAGYL  Take 1 tablet (500 mg total) by mouth 2 (two) times daily.     multivitamin with minerals Tabs  Take 1 tablet by mouth daily.        Follow-up Information   Follow up with [facility] In 4 weeks. (someone will call to schedule)    Contact information:   [address] [place] [address] [phone]        [facility] 08/09/2012, 2:12 PM

## 2012-08-09 NOTE — MAU Note (Signed)
Pt reports she has had abd pain and off for several months. Not had a period since Feb. Pregnancy test negative.

## 2012-08-11 NOTE — MAU Provider Note (Signed)
Chart reviewed and agree with management and plan.  

## 2012-08-18 ENCOUNTER — Encounter: Payer: Self-pay | Admitting: Family Medicine

## 2012-09-15 ENCOUNTER — Encounter: Payer: Self-pay | Admitting: Obstetrics and Gynecology

## 2012-09-15 ENCOUNTER — Ambulatory Visit (INDEPENDENT_AMBULATORY_CARE_PROVIDER_SITE_OTHER): Payer: Medicaid Other | Admitting: Obstetrics and Gynecology

## 2012-09-15 VITALS — BP 127/86 | HR 67 | Temp 97.0°F | Ht 62.0 in | Wt 214.8 lb

## 2012-09-15 DIAGNOSIS — E282 Polycystic ovarian syndrome: Secondary | ICD-10-CM

## 2012-09-15 HISTORY — DX: Polycystic ovarian syndrome: E28.2

## 2012-09-15 MED ORDER — METFORMIN HCL 500 MG PO TABS: 500.0000 mg | ORAL_TABLET | Freq: Two times a day (BID) | ORAL | Status: DC

## 2012-09-15 NOTE — Progress Notes (Signed)
CC: Amenorrhea   HPI Cheryl Bright is a 27 y.o. [id number]  who presents with For evaluation of oligomenorrhea. She states she had regular menses until 2007 when she stopped taking oral contraceptives after being on them for only a few months. She often goes 2-3 months without a period and denies having heavy bleeding. She has some facial hirsutism and shaves face. Say she was seen by Dr. Gaynell Face sometime during last year due to single episode of nipple discharge and had a negative prolactin test at that time. LMP was 04/24/2012. No intermenstrual bleeding. Urine pregnancy test negative 08/11/2012 when she was seen in maternity admissions. She at that time had lower abdominal pain and was treated for BV. No contraception and feels it would be okay if she does get pregnant. All Paps normal and states the last was last year with Dr. Gaynell Face.  Past Medical History  Diagnosis Date  . No pertinent past medical history     OB History   Grav Para Term Preterm Abortions TAB SAB Ect Mult Living   3 2 2  1  1   2      # Outc Date GA Lbr Len/2nd Wgt Sex Del Anes PTL Lv   1 TRM 6/03    M SVD EPI  Yes   2 TRM 4/06    M LTCS EPI  Yes   3 SAB               Past Surgical History  Procedure Laterality Date  . Foot surgery    . Cesarean section      History   Social History  . Marital Status: Single    Spouse Name: N/A    Number of Children: N/A  . Years of Education: N/A   Occupational History  . Not on file.   Social History Main Topics  . Smoking status: Current Every Day Smoker -- 0.25 packs/day  . Smokeless tobacco: Not on file  . Alcohol Use: Yes     Comment: several times per month  . Drug Use: No  . Sexually Active:    Other Topics Concern  . Not on file   Social History Narrative  . No narrative on file    Current Outpatient Prescriptions on File Prior to Visit  Medication Sig Dispense Refill  . Multiple Vitamin (MULITIVITAMIN WITH MINERALS) TABS Take 1 tablet by mouth  daily.      Marland Kitchen ibuprofen (ADVIL,MOTRIN) 800 MG tablet Take 1 tablet (800 mg total) by mouth every 6 (six) hours as needed for pain.  30 tablet  0  . metroNIDAZOLE (FLAGYL) 500 MG tablet Take 1 tablet (500 mg total) by mouth 2 (two) times daily.  14 tablet  0   No current facility-administered medications on file prior to visit.    Allergies  Allergen Reactions  . Sulfa Drugs Cross Reactors Hives and Swelling    ROS Pertinent items in HPI. 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 Macon Large: Metformin 500 qd x 1 wk then 1000 mg qd x 1 wk, then 1500 mg qd F/U  3 months.    Danae Orleans, CNM 09/15/2012 4:11 PM

## 2012-09-15 NOTE — Patient Instructions (Signed)
Polycystic Ovarian Syndrome Polycystic ovarian syndrome is a condition with a number of problems. One problem is with the ovaries. The ovaries are organs located in the female pelvis, on each side of the uterus. Usually, during the menstrual cycle, an egg is released from 1 ovary every month. This is called ovulation. When the egg is fertilized, it goes into the womb (uterus), which allows for the growth of a baby. The egg travels from the ovary through the fallopian tube to the uterus. The ovaries also make the hormones estrogen and progesterone. These hormones help the development of a woman's breasts, body shape, and body hair. They also regulate the menstrual cycle and pregnancy. Sometimes, cysts form in the ovaries. A cyst is a fluid-filled sac. On the ovary, different types of cysts can form. The most common type of ovarian cyst is called a functional or ovulation cyst. It is normal, and often forms during the normal menstrual cycle. Each month, a woman's ovaries grow tiny cysts that hold the eggs. When an egg is fully grown, the sac breaks open. This releases the egg. Then, the sac which released the egg from the ovary dissolves. In one type of functional cyst, called a follicle cyst, the sac does not break open to release the egg. It may actually continue to grow. This type of cyst usually disappears within 1 to 3 months.  One type of cyst problem with the ovaries is called Polycystic Ovarian Syndrome (PCOS). In this condition, many follicle cysts form, but do not rupture and produce an egg. This health problem can affect the following:  Menstrual cycle.  Heart.  Obesity.  Cancer of the uterus.  Fertility.  Blood vessels.  Hair growth (face and body) or baldness.  Hormones.  Appearance.  High blood pressure.  Stroke.  Insulin production.  Inflammation of the liver.  Elevated blood cholesterol and triglycerides. CAUSES   No one knows the exact cause of PCOS.  Women with  PCOS often have a mother or sister with PCOS. There is not yet enough proof to say this is inherited.  Many women with PCOS have a weight problem.  Researchers are looking at the relationship between PCOS and the body's ability to make insulin. Insulin is a hormone that regulates the change of sugar, starches, and other food into energy for the body's use, or for storage. Some women with PCOS make too much insulin. It is possible that the ovaries react by making too many female hormones, called androgens. This can lead to acne, excessive hair growth, weight gain, and ovulation problems.  Too much production of luteinizing hormone (LH) from the pituitary gland in the brain stimulates the ovary to produce too much female hormone (androgen). SYMPTOMS   Infrequent or no menstrual periods, and/or irregular bleeding.  Inability to get pregnant (infertility), because of not ovulating.  Increased growth of hair on the face, chest, stomach, back, thumbs, thighs, or toes.  Acne, oily skin, or dandruff.  Pelvic pain.  Weight gain or obesity, usually carrying extra weight around the waist.  Type 2 diabetes (this is the diabetes that usually does not need insulin).  High cholesterol.  High blood pressure.  Female-pattern baldness or thinning hair.  Patches of thickened and dark brown or black skin on the neck, arms, breasts, or thighs.  Skin tags, or tiny excess flaps of skin, in the armpits or neck area.  Sleep apnea (excessive snoring and breathing stops at times while asleep).  Deepening of the voice.  Gestational diabetes when pregnant.  Increased risk of miscarriage with pregnancy. DIAGNOSIS  There is no single test to diagnose PCOS.   Your caregiver will:  Take a medical history.  Perform a pelvic exam.  Perform an ultrasound.  Check your female and female hormone levels.  Measure glucose or sugar levels in the blood.  Do other blood tests.  If you are producing too many  female hormones, your caregiver will make sure it is from PCOS. At the physical exam, your caregiver will want to evaluate the areas of increased hair growth. Try to allow natural hair growth for a few days before the visit.  During a pelvic exam, the ovaries may be enlarged or swollen by the increased number of small cysts. This can be seen more easily by vaginal ultrasound or screening, to examine the ovaries and lining of the uterus (endometrium) for cysts. The uterine lining may become thicker, if there has not been a regular period. TREATMENT  Because there is no cure for PCOS, it needs to be managed to prevent problems. Treatments are based on your symptoms. Treatment is also based on whether you want to have a baby or whether you need contraception.  Treatment may include:  Progesterone hormone, to start a menstrual period.  Birth control pills, to make you have regular menstrual periods.  Medicines to make you ovulate, if you want to get pregnant.  Medicines to control your insulin.  Medicine to control your blood pressure.  Medicine and diet, to control your high cholesterol and triglycerides in your blood.  Surgery, making small holes in the ovary, to decrease the amount of female hormone production. This is done through a long, lighted tube (laparoscope), placed into the pelvis through a tiny incision in the lower abdomen. Your caregiver will go over some of the choices with you. WOMEN WITH PCOS HAVE THESE CHARACTERISTICS:  High levels of female hormones called androgens.  An irregular or no menstrual cycle.  May have many small cysts in their ovaries. PCOS is the most common hormonal reproductive problem in women of childbearing age. WHY DO WOMEN WITH PCOS HAVE TROUBLE WITH THEIR MENSTRUAL CYCLE? Each month, about 20 eggs start to mature in the ovaries. As one egg grows and matures, the follicle breaks open to release the egg, so it can travel through the fallopian tube for  fertilization. When the single egg leaves the follicle, ovulation takes place. In women with PCOS, the ovary does not make all of the hormones it needs for any of the eggs to fully mature. They may start to grow and accumulate fluid, but no one egg becomes large enough. Instead, some may remain as cysts. Since no egg matures or is released, ovulation does not occur and the hormone progesterone is not made. Without progesterone, a woman's menstrual cycle is irregular or absent. Also, the cysts produce female hormones, which continue to prevent ovulation.  Document Released: 06/15/2004 Document Revised: 05/14/2011 Document Reviewed: 01/07/2009 [facility] Patient Information 2014 [place], [place].

## 2012-09-15 NOTE — Addendum Note (Signed)
Addended by: Franchot Mimes on: 09/15/2012 04:36 PM   Modules accepted: Orders

## 2012-11-02 ENCOUNTER — Emergency Department (HOSPITAL_COMMUNITY)
Admission: EM | Admit: 2012-11-02 | Discharge: 2012-11-02 | Disposition: A | Discharge status: Home / Self Care | Payer: Medicaid Other | Attending: Emergency Medicine | Admitting: Emergency Medicine

## 2012-11-02 ENCOUNTER — Encounter (HOSPITAL_COMMUNITY): Payer: Self-pay | Admitting: Emergency Medicine

## 2012-11-02 DIAGNOSIS — R21 Rash and other nonspecific skin eruption: Secondary | ICD-10-CM

## 2012-11-02 DIAGNOSIS — F17200 Nicotine dependence, unspecified, uncomplicated: Secondary | ICD-10-CM | POA: Insufficient documentation

## 2012-11-02 MED ORDER — HYDROXYZINE HCL 25 MG PO TABS: 25.0000 mg | ORAL_TABLET | Freq: Four times a day (QID) | ORAL | Status: DC

## 2012-11-02 MED ORDER — PREDNISONE 20 MG PO TABS: ORAL_TABLET | ORAL | Status: DC

## 2012-11-02 MED ORDER — PREDNISONE 20 MG PO TABS
60.0000 mg | ORAL_TABLET | Freq: Once | ORAL | Status: AC
  Administered 2012-11-02: 60 mg via ORAL
  Filled 2012-11-02: qty 3

## 2012-11-02 NOTE — ED Provider Notes (Signed)
Medical screening examination/treatment/procedure(s) were performed by non-physician practitioner and as supervising physician I was immediately available for consultation/collaboration.  Paulyne Mooty L Khamya Topp, MD 11/02/12 2324 

## 2012-11-02 NOTE — ED Provider Notes (Signed)
CSN: [id number]     Arrival date & time 11/02/12  1545 History  This chart was scribed for Cheryl Gourd, PA-C, non-physician practitioner working with Flint Melter, MD, by Karle Plumber, ED Scribe.  This patient was seen in room WTR9/WTR9 and the patient's care was started at 4:15 PM.  Chief Complaint  Patient presents with  . Rash   The history is provided by the patient. No language interpreter was used.   HPI Comments:  Cheryl Bright is a 27 y.o. female who presents to the Emergency Department complaining of a constant gradually spreading, itching rash on back, abdomen and upper extremities for the past week . Pt denies any new soaps, lotions, creams, or medications.  She reports being stung with severe burning on the back by an unknown insect approximately 1.5 weeks ago. Pt states she has taken Benadryl without relief. Pt denies fever, chills, SOB, difficulty swallowing and all other symptoms.    Past Medical History  Diagnosis Date  . No pertinent past medical history    Past Surgical History  Procedure Laterality Date  . Foot surgery    . Cesarean section     No family history on file. History  Substance Use Topics  . Smoking status: Current Every Day Smoker -- 0.50 packs/day for 5 years    Types: Cigarettes  . Smokeless tobacco: Never Used  . Alcohol Use: Yes     Comment: several times per month   OB History   Grav Para Term Preterm Abortions TAB SAB Ect Mult Living   3 2 2  1  1   2      Review of Systems  Constitutional: Negative for fever and chills.  HENT: Negative for trouble swallowing.   Respiratory: Negative for shortness of breath and wheezing.   Cardiovascular: Negative for chest pain.  Gastrointestinal: Negative for nausea.  Skin: Positive for rash.  All other systems reviewed and are negative.    Allergies  Sulfa drugs cross reactors  Home Medications   Current Outpatient Rx  Name  Route  Sig  Dispense  Refill  . metFORMIN (GLUCOPHAGE) 500  MG tablet   Oral   Take 1 tablet (500 mg total) by mouth 2 (two) times daily with a meal.   30 tablet   3     After 1 wk, 1 tab po bid,> After 1 week 1 tabs po  ...   . Multiple Vitamin (MULITIVITAMIN WITH MINERALS) TABS   Oral   Take 1 tablet by mouth daily.          Triage Vitals: BP 125/77  Pulse 77  Temp(Src) 98.7 F (37.1 C) (Oral)  Resp 20  SpO2 100%  LMP 04/24/2012 Physical Exam  Nursing note and vitals reviewed. Constitutional: She is oriented to person, place, and time. She appears well-developed and well-nourished. No distress.  HENT:  Head: Normocephalic and atraumatic.  Mouth/Throat: Oropharynx is clear and moist.  Eyes: Conjunctivae and EOM are normal.  Neck: Normal range of motion. Neck supple.  Cardiovascular: Normal rate, regular rhythm and normal heart sounds.   Pulmonary/Chest: Effort normal and breath sounds normal. No respiratory distress. She has no wheezes. She has no rales. She exhibits no tenderness.  Musculoskeletal: Normal range of motion. She exhibits no edema.  Neurological: She is alert and oriented to person, place, and time. No sensory deficit.  Skin: Skin is warm and dry. Rash noted.  Scattered maculopapular rash on chest, abdomen, lower back and bilateral forearms  in folds of elbow with no bite marks anywhere. Spares mucosa and palms of hands.  Psychiatric: She has a normal mood and affect. Her behavior is normal.    ED Course  Procedures (including critical care time) DIAGNOSTIC STUDIES: Oxygen Saturation is 100% on RA, normal by my interpretation.   COORDINATION OF CARE: 4:21 PM- Will prescribe Atarax, prednisone, and advised pt to use hydrocortisone cream. Pt verbalizes understanding and agrees to plan.  Labs Review Labs Reviewed - No data to display Imaging Review No results found.  MDM   1. Rash    Patient with maculopapular rash. No respiratory or airway compromise. Rash most likely from bite that she obtained a week and  half ago. She is in no apparent distress normal vital signs. With the prednisone and Atarax for itching. Return cautions discussed. Patient sates understanding of plan and is agreeable.  I personally performed the services described in this documentation, which was scribed in my presence. The recorded information has been reviewed and is accurate.   Trevor Mace, PA-C 11/02/12 1624

## 2012-11-02 NOTE — ED Notes (Addendum)
Pt reports chief complaint of generalized rash to the upper body, rash does not extend below the waist. Rash is raised and areas are noticed to the face, bilateral arms, chest, and back. Pt denies changing any soaps or detergents. Pt reports being stung by an insect last week and noticed the rash afterwards. Pt reports that the rash does itch. Pt is A/O x4, oxygen saturation 100% on room air, and in NAD.

## 2012-11-08 ENCOUNTER — Emergency Department (HOSPITAL_COMMUNITY)
Admission: EM | Admit: 2012-11-08 | Discharge: 2012-11-08 | Disposition: A | Discharge status: Home / Self Care | Payer: Medicaid Other | Attending: Emergency Medicine | Admitting: Emergency Medicine

## 2012-11-08 ENCOUNTER — Encounter (HOSPITAL_COMMUNITY): Payer: Self-pay | Admitting: Emergency Medicine

## 2012-11-08 DIAGNOSIS — R195 Other fecal abnormalities: Secondary | ICD-10-CM | POA: Insufficient documentation

## 2012-11-08 DIAGNOSIS — F17200 Nicotine dependence, unspecified, uncomplicated: Secondary | ICD-10-CM | POA: Insufficient documentation

## 2012-11-08 DIAGNOSIS — Z79899 Other long term (current) drug therapy: Secondary | ICD-10-CM | POA: Insufficient documentation

## 2012-11-08 DIAGNOSIS — K5900 Constipation, unspecified: Secondary | ICD-10-CM | POA: Insufficient documentation

## 2012-11-08 NOTE — ED Notes (Addendum)
Pt states that about 30 minutes ago, pt had a bowel movement and stated that one of the pieces looked weird and reminded her of a worm.  Pt has brought a picture of her poop for clarification purposes.  Denies pain, denies NVD.

## 2012-11-08 NOTE — ED Provider Notes (Signed)
Medical screening examination/treatment/procedure(s) were performed by non-physician practitioner and as supervising physician I was immediately available for consultation/collaboration.    Gilda Crease, MD 11/08/12 [phone]

## 2012-11-08 NOTE — ED Provider Notes (Signed)
CSN: [id number]     Arrival date & time 11/08/12  1417 History   First MD Initiated Contact with Patient 11/08/12 1457     Chief Complaint  Patient presents with  . "Pooped out a worm"    (Consider location/radiation/quality/duration/timing/severity/associated sxs/prior Treatment) HPI Comments: Patient presents out of concern for having an abnormal bowel movement. Patient has told the nurse that she thinks she pooped out worm. Patient brought in a photo of the stool. Patient states he intermittently has constipation and diarrhea and has to take probiotics in order to have a stool. Has been ongoing for years. States that the stool today was abnormal And only comments that it would is very long. Denies fever, chills, nausea, and vomiting, abdominal pain, blood in her stool. No family history of inflammatory bowel disease. Denies any recent change in diet, denies any recent travel. Denies any sick contacts  The history is provided by the patient.    Past Medical History  Diagnosis Date  . No pertinent past medical history    Past Surgical History  Procedure Laterality Date  . Foot surgery    . Cesarean section     No family history on file. History  Substance Use Topics  . Smoking status: Current Every Day Smoker -- 0.50 packs/day for 5 years    Types: Cigarettes  . Smokeless tobacco: Never Used  . Alcohol Use: Yes     Comment: several times per month   OB History   Grav Para Term Preterm Abortions TAB SAB Ect Mult Living   3 2 2  1  1   2      Review of Systems  Constitutional: Negative for fever and chills.  Gastrointestinal: Positive for constipation (chronic, not current). Negative for nausea, vomiting, abdominal pain, diarrhea and anal bleeding.  Genitourinary: Negative for menstrual problem.    Allergies  Sulfa drugs cross reactors  Home Medications   Current Outpatient Rx  Name  Route  Sig  Dispense  Refill  . Acetaminophen-Caffeine 500-65 MG TABS   Oral   Take 1  tablet by mouth daily as needed.         . loratadine (CLARITIN) 10 MG tablet   Oral   Take 10 mg by mouth daily as needed for allergies.         . metFORMIN (GLUCOPHAGE) 500 MG tablet   Oral   Take 1 tablet (500 mg total) by mouth 2 (two) times daily with a meal.   30 tablet   3     After 1 wk, 1 tab po bid,> After 1 week 1 tabs po  ...    BP 153/95  Pulse 85  Temp(Src) 99 F (37.2 C) (Oral)  Resp 16  SpO2 95%  LMP 04/24/2012 Physical Exam  Nursing note and vitals reviewed. Constitutional: She appears well-developed and well-nourished. No distress.  HENT:  Head: Normocephalic and atraumatic.  Neck: Neck supple.  Pulmonary/Chest: Effort normal.  Abdominal: Soft. She exhibits no distension and no mass. There is no tenderness. There is no rebound and no guarding.  obese  Neurological: She is alert.  Skin: She is not diaphoretic.    ED Course  Procedures (including critical care time) Labs Review Labs Reviewed - No data to display Imaging Review No results found.  MDM   1. Stool mucus    Patient presented complaining of abnormal stool. Patient had absolutely no symptoms but did not like the way her bowel movement looked. It appears to  me that it has some mucous in it but is otherwise not concerning.  Discussed  findings, treatment, and follow up  with patient.  Pt given return precautions.  Pt verbalizes understanding and agrees with plan.        Trixie Dredge, PA-C 11/08/12 1515

## 2013-01-08 ENCOUNTER — Other Ambulatory Visit: Payer: Self-pay

## 2013-06-27 ENCOUNTER — Emergency Department (HOSPITAL_COMMUNITY): Payer: Medicaid Other

## 2013-06-27 ENCOUNTER — Emergency Department (HOSPITAL_COMMUNITY)
Admission: EM | Admit: 2013-06-27 | Discharge: 2013-06-27 | Disposition: A | Discharge status: Home / Self Care | Payer: Medicaid Other | Attending: Emergency Medicine | Admitting: Emergency Medicine

## 2013-06-27 ENCOUNTER — Encounter (HOSPITAL_COMMUNITY): Payer: Self-pay | Admitting: Emergency Medicine

## 2013-06-27 DIAGNOSIS — G35 Multiple sclerosis: Secondary | ICD-10-CM | POA: Insufficient documentation

## 2013-06-27 DIAGNOSIS — F3289 Other specified depressive episodes: Secondary | ICD-10-CM | POA: Insufficient documentation

## 2013-06-27 DIAGNOSIS — Z3202 Encounter for pregnancy test, result negative: Secondary | ICD-10-CM | POA: Insufficient documentation

## 2013-06-27 DIAGNOSIS — F329 Major depressive disorder, single episode, unspecified: Secondary | ICD-10-CM | POA: Insufficient documentation

## 2013-06-27 DIAGNOSIS — Z79899 Other long term (current) drug therapy: Secondary | ICD-10-CM | POA: Insufficient documentation

## 2013-06-27 DIAGNOSIS — F17200 Nicotine dependence, unspecified, uncomplicated: Secondary | ICD-10-CM | POA: Insufficient documentation

## 2013-06-27 HISTORY — DX: Depression, unspecified: F32.A

## 2013-06-27 HISTORY — DX: Major depressive disorder, single episode, unspecified: F32.9

## 2013-06-27 LAB — CBC WITH DIFFERENTIAL/PLATELET
BASOS ABS: 0 10*3/uL (ref 0.0–0.1)
BASOS PCT: 0 % (ref 0–1)
EOS PCT: 2 % (ref 0–5)
Eosinophils Absolute: 0.1 10*3/uL (ref 0.0–0.7)
HCT: 41.4 % (ref 36.0–46.0)
Hemoglobin: 14.4 g/dL (ref 12.0–15.0)
LYMPHS PCT: 41 % (ref 12–46)
Lymphs Abs: 2.5 10*3/uL (ref 0.7–4.0)
MCH: 30.5 pg (ref 26.0–34.0)
MCHC: 34.8 g/dL (ref 30.0–36.0)
MCV: 87.7 fL (ref 78.0–100.0)
Monocytes Absolute: 0.3 10*3/uL (ref 0.1–1.0)
Monocytes Relative: 6 % (ref 3–12)
Neutro Abs: 3.1 10*3/uL (ref 1.7–7.7)
Neutrophils Relative %: 51 % (ref 43–77)
PLATELETS: 358 10*3/uL (ref 150–400)
RBC: 4.72 MIL/uL (ref 3.87–5.11)
RDW: 14 % (ref 11.5–15.5)
WBC: 6 10*3/uL (ref 4.0–10.5)

## 2013-06-27 LAB — BASIC METABOLIC PANEL
BUN: 14 mg/dL (ref 6–23)
CALCIUM: 9.3 mg/dL (ref 8.4–10.5)
CO2: 24 meq/L (ref 19–32)
CREATININE: 0.76 mg/dL (ref 0.50–1.10)
Chloride: 102 mEq/L (ref 96–112)
Glucose, Bld: 91 mg/dL (ref 70–99)
Potassium: 4.5 mEq/L (ref 3.7–5.3)
SODIUM: 139 meq/L (ref 137–147)

## 2013-06-27 LAB — POC URINE PREG, ED: Preg Test, Ur: NEGATIVE

## 2013-06-27 LAB — HCG, SERUM, QUALITATIVE: PREG SERUM: NEGATIVE

## 2013-06-27 LAB — TSH: TSH: 0.98 u[IU]/mL (ref 0.350–4.500)

## 2013-06-27 LAB — VITAMIN B12: Vitamin B-12: 1086 pg/mL — ABNORMAL HIGH (ref 211–911)

## 2013-06-27 MED ORDER — GADOBENATE DIMEGLUMINE 529 MG/ML IV SOLN
20.0000 mL | Freq: Once | INTRAVENOUS | Status: AC | PRN
  Administered 2013-06-27: 20 mL via INTRAVENOUS

## 2013-06-27 MED ORDER — METHYLPREDNISOLONE SODIUM SUCC 1000 MG IJ SOLR
1000.0000 mg | Freq: Once | INTRAMUSCULAR | Status: AC
  Administered 2013-06-27: 1000 mg via INTRAVENOUS
  Filled 2013-06-27: qty 8

## 2013-06-27 MED ORDER — ESZOPICLONE 1 MG PO TABS: 1.0000 mg | ORAL_TABLET | Freq: Every evening | ORAL | Status: DC | PRN

## 2013-06-27 MED ORDER — METHYLPREDNISOLONE 4 MG PO KIT: PACK | ORAL | Status: DC

## 2013-06-27 MED ORDER — LORAZEPAM 2 MG/ML IJ SOLN
1.0000 mg | INTRAMUSCULAR | Status: DC | PRN
  Administered 2013-06-27: 1 mg via INTRAVENOUS
  Filled 2013-06-27 (×2): qty 1

## 2013-06-27 NOTE — Discharge Instructions (Signed)
Multiple Sclerosis  Multiple sclerosis (MS) is a disease of the central nervous system. It leads to loss of the insulating covering of the nerves (myelin sheath) of your brain. When this happens, brain signals do not get transmitted properly or may not get transmitted at all. The symptoms of MS occur in episodes or attacks. These attacks may last weeks to months. There may be long periods of nearly no problems between attacks. The age of onset of MS varies.   CAUSES  The cause of MS is unknown. However, it is more common in the [place] than in the southern [place].  RISK FACTORS  There is a higher incidence of MS in women than in men. MS is not an inherited illness, although your risk of MS is higher if you have a relative with MS.  SIGNS AND SYMPTOMS   The symptoms of MS occur in episodes or attacks. These attacks may last weeks to months. There may be long periods of almost no symptoms between attacks.  The symptoms of MS vary. This is because of the many different ways it affects the central nervous system. The main symptoms of MS include:   Vision problems and eye pain.   Numbness.   Weakness.   Paralysis in your arms, hands, feet, and legs (extremities).   Balance problems.   Tremors.  DIAGNOSIS   Your health care provider can diagnose MS with the help of imaging exams and lab tests. These may include specialized X-ray exams and spinal fluid tests. The best imaging exam to confirm a diagnosis of MS is MRI.  TREATMENT   There is no known cure for MS, but there are medicines that can decrease the number and frequency of attacks. Steroids are often used for short-term relief. Physical and occupational therapy may also help.  HOME CARE INSTRUCTIONS    Take medicines as directed by your health care provider.   Exercise as directed by your health care provider.  SEEK MEDICAL CARE IF:  You begin to feel depressed.  SEEK IMMEDIATE MEDICAL CARE IF:   You develop paralysis.   You develop  problems with bladder, bowel, or sexual function.   You develop mental changes, such as forgetfulness or mood swings.   You have a seizure.  Document Released: 02/17/2000 Document Revised: 12/10/2012 Document Reviewed: 10/27/2012  [employer] Patient Information 2014 [employer].

## 2013-06-27 NOTE — ED Notes (Addendum)
Called pharmacy RE med and they stated they are in process of making med now.

## 2013-06-27 NOTE — ED Notes (Signed)
Pt c/o intermittent burning and weakness to limbs over the past few weeks.  Fiance states pt has had episodes where various limbs, or an entire side, will experience burning and then become numb.  States episode will last over 45 minutes, then pt will return to normal.  Pt has sister who has MS.  Pt has also been under a lot of stress, per fiance.  AO x 4.

## 2013-06-27 NOTE — ED Notes (Signed)
Pharmacy called again for med 

## 2013-06-27 NOTE — ED Notes (Signed)
Pt still at MRI

## 2013-06-27 NOTE — ED Provider Notes (Addendum)
CSN: [id number]     Arrival date & time 06/27/13  0940 History   First MD Initiated Contact with Patient 06/27/13 [phone]     Chief Complaint  Patient presents with  . Numbness     HPI  Presents with episodes of numbness and tingling in her extremities. She and family state that for almost 5 months she's had episodes where she will feel numb or weak. Occasionally her left side. Occasionally only extremity. Most recent her right leg felt numb and weak yesterday. It is less so today she is still ambulatory. Sometimes it lasts less than a 3 hour sometimes it lasts for several days. She is concerned because she has a sister who has multiple sclerosis. She's had vision changes or scotoma. She's not had double vision. She's not had bowel or bladder symptoms. No headaches. No falls no injuries no trauma.  Past Medical History  Diagnosis Date  . No pertinent past medical history   . Depression    Past Surgical History  Procedure Laterality Date  . Foot surgery    . Cesarean section     No family history on file. History  Substance Use Topics  . Smoking status: Current Every Day Smoker -- 0.50 packs/day for 5 years    Types: Cigarettes  . Smokeless tobacco: Never Used  . Alcohol Use: Yes     Comment: several times per month   OB History   Grav Para Term Preterm Abortions TAB SAB Ect Mult Living   3 2 2  1  1   2      Review of Systems  Constitutional: Negative for fever, chills, diaphoresis, appetite change and fatigue.  HENT: Negative for mouth sores, sore throat and trouble swallowing.   Eyes: Negative for visual disturbance.  Respiratory: Negative for cough, chest tightness, shortness of breath and wheezing.   Cardiovascular: Negative for chest pain.  Gastrointestinal: Negative for nausea, vomiting, abdominal pain, diarrhea and abdominal distention.  Endocrine: Negative for polydipsia, polyphagia and polyuria.  Genitourinary: Negative for dysuria, frequency and hematuria.   Musculoskeletal: Negative for gait problem.  Skin: Negative for color change, pallor and rash.  Neurological: Positive for weakness and numbness. Negative for dizziness, syncope, light-headedness and headaches.  Hematological: Does not bruise/bleed easily.  Psychiatric/Behavioral: Negative for behavioral problems and confusion.      Allergies  Sulfa drugs cross reactors  Home Medications   Prior to Admission medications   Medication Sig Start Date End Date Taking? Authorizing Provider  PRESCRIPTION MEDICATION Take 1 tablet by mouth daily. Prescription birth control "Tri-previsem"   Yes Historical Provider, MD  sertraline (ZOLOFT) 100 MG tablet Take 100 mg by mouth daily as needed.   Yes Historical Provider, MD  eszopiclone (LUNESTA) 1 MG TABS tablet Take 1 tablet (1 mg total) by mouth at bedtime as needed for sleep. Take immediately before bedtime 06/27/13   Rolland Porter, MD  methylPREDNISolone (MEDROL DOSEPAK) 4 MG tablet 6 po on day 1, decrease by 1 tab per day 06/27/13   Rolland Porter, MD   BP 120/81  Pulse 63  Temp(Src) 98.4 F (36.9 C) (Oral)  Resp 18  Ht 5\' 2"  (1.575 m)  Wt 206 lb (93.441 kg)  BMI 37.67 kg/m2  SpO2 100%  LMP 06/15/2013 Physical Exam  Constitutional: She is oriented to person, place, and time. She appears well-developed and well-nourished. No distress.  HENT:  Head: Normocephalic.  Eyes: Conjunctivae are normal. Pupils are equal, round, and reactive to light. No scleral icterus.  Neck: Normal range of motion. Neck supple. No thyromegaly present.  Cardiovascular: Normal rate and regular rhythm.  Exam reveals no gallop and no friction rub.   No murmur heard. Pulmonary/Chest: Effort normal and breath sounds normal. No respiratory distress. She has no wheezes. She has no rales.  Abdominal: Soft. Bowel sounds are normal. She exhibits no distension. There is no tenderness. There is no rebound.  Musculoskeletal: Normal range of motion.  Neurological: She is alert  and oriented to person, place, and time.  No cranial nerve palsies. She has normal gait. She is normal Romberg. She has downgoing Babinski's. No motor deficits. No sensory deficits currently.  Skin: Skin is warm and dry. No rash noted.  Psychiatric: She has a normal mood and affect. Her behavior is normal.    ED Course  Procedures (including critical care time) Labs Review Labs Reviewed  CBC WITH DIFFERENTIAL  BASIC METABOLIC PANEL  HCG, SERUM, QUALITATIVE  TSH  VITAMIN B12  POC URINE PREG, ED    Imaging Review Mr Laqueta Jean Wo Contrast  06/27/2013   EXAM: MRI CERVICAL SPINE WITHOUT AND WITH CONTRAST; MRI THORACIC SPINE WITHOUT AND WITH CONTRAST; MRI HEAD WITHOUT AND WITH CONTRAST  TECHNIQUE: Multiplanar and multiecho pulse sequences of the cervical spine, to include the craniocervical junction and cervicothoracic junction, were obtained according to standard protocol without and with intravenous contrast.; Multiplanar and multiecho pulse sequences of the thoracic spine were obtained without and with intravenous contrast.; Multiplanar, multiecho pulse sequences of the brain and surrounding structures were obtained according to standard protocol without and with intravenous contrast  CONTRAST:  27mL MULTIHANCE GADOBENATE DIMEGLUMINE 529 MG/ML IV SOLN  COMPARISON:  None.  FINDINGS: MRI head with and without: There is considerable motion degradation.  There are multifocal white matter lesions scattered throughout the deep white matter and to a lesser extent the subcortical white matter. Many of these have a perpendicular orientation towards the ventricular system. No cortical based abnormality is seen. After contrast administration, no abnormal enhancement occurs. No mass lesion, hemorrhage, hydrocephalus or extra-axial collection. The pattern is highly likely to represent multiple sclerosis.  MRI cervical spine with without contrast: Alignment is spine is normal. No significant degenerative change or  stenosis. There are multiple areas of abnormal T2 signal within the cord, most notable in the C2 through C5 region. This consistent with multiple sclerosis involvement of the cord. After contrast administration, there is some enhancement at the C3 and C4 level.  MRI thoracic spine with an without: Alignment is normal. No degenerative change or stenosis. There are multifocal areas of abnormal T2 signal within the thoracic spinal cord, throughout the extent of the thoracic region. This is most pronounced in the region from T6 through T10, but there is also involvement of the distal cord at the T11 and T12 level. Many of these areas show contrast enhancement.  IMPRESSION: Extensive and advanced white matter lesions throughout the brain and throughout the cervical and thoracic cord consistent with a fulminant presentation of multiple sclerosis. None of the brain lesions enhance, but many of the spinal cord lesions do show contrast enhancement.   Electronically Signed   By: Paulina Fusi M.D.   On: 06/27/2013 15:19   Mr Cervical Spine W Wo Contrast  06/27/2013   EXAM: MRI CERVICAL SPINE WITHOUT AND WITH CONTRAST; MRI THORACIC SPINE WITHOUT AND WITH CONTRAST; MRI HEAD WITHOUT AND WITH CONTRAST  TECHNIQUE: Multiplanar and multiecho pulse sequences of the cervical spine, to include the craniocervical junction and  cervicothoracic junction, were obtained according to standard protocol without and with intravenous contrast.; Multiplanar and multiecho pulse sequences of the thoracic spine were obtained without and with intravenous contrast.; Multiplanar, multiecho pulse sequences of the brain and surrounding structures were obtained according to standard protocol without and with intravenous contrast  CONTRAST:  20mL MULTIHANCE GADOBENATE DIMEGLUMINE 529 MG/ML IV SOLN  COMPARISON:  None.  FINDINGS: MRI head with and without: There is considerable motion degradation.  There are multifocal white matter lesions scattered  throughout the deep white matter and to a lesser extent the subcortical white matter. Many of these have a perpendicular orientation towards the ventricular system. No cortical based abnormality is seen. After contrast administration, no abnormal enhancement occurs. No mass lesion, hemorrhage, hydrocephalus or extra-axial collection. The pattern is highly likely to represent multiple sclerosis.  MRI cervical spine with without contrast: Alignment is spine is normal. No significant degenerative change or stenosis. There are multiple areas of abnormal T2 signal within the cord, most notable in the C2 through C5 region. This consistent with multiple sclerosis involvement of the cord. After contrast administration, there is some enhancement at the C3 and C4 level.  MRI thoracic spine with an without: Alignment is normal. No degenerative change or stenosis. There are multifocal areas of abnormal T2 signal within the thoracic spinal cord, throughout the extent of the thoracic region. This is most pronounced in the region from T6 through T10, but there is also involvement of the distal cord at the T11 and T12 level. Many of these areas show contrast enhancement.  IMPRESSION: Extensive and advanced white matter lesions throughout the brain and throughout the cervical and thoracic cord consistent with a fulminant presentation of multiple sclerosis. None of the brain lesions enhance, but many of the spinal cord lesions do show contrast enhancement.   Electronically Signed   By: Paulina FusiMark  Shogry M.D.   On: 06/27/2013 15:19   Mr Thoracic Spine W Wo Contrast  06/27/2013   EXAM: MRI CERVICAL SPINE WITHOUT AND WITH CONTRAST; MRI THORACIC SPINE WITHOUT AND WITH CONTRAST; MRI HEAD WITHOUT AND WITH CONTRAST  TECHNIQUE: Multiplanar and multiecho pulse sequences of the cervical spine, to include the craniocervical junction and cervicothoracic junction, were obtained according to standard protocol without and with intravenous contrast.;  Multiplanar and multiecho pulse sequences of the thoracic spine were obtained without and with intravenous contrast.; Multiplanar, multiecho pulse sequences of the brain and surrounding structures were obtained according to standard protocol without and with intravenous contrast  CONTRAST:  20mL MULTIHANCE GADOBENATE DIMEGLUMINE 529 MG/ML IV SOLN  COMPARISON:  None.  FINDINGS: MRI head with and without: There is considerable motion degradation.  There are multifocal white matter lesions scattered throughout the deep white matter and to a lesser extent the subcortical white matter. Many of these have a perpendicular orientation towards the ventricular system. No cortical based abnormality is seen. After contrast administration, no abnormal enhancement occurs. No mass lesion, hemorrhage, hydrocephalus or extra-axial collection. The pattern is highly likely to represent multiple sclerosis.  MRI cervical spine with without contrast: Alignment is spine is normal. No significant degenerative change or stenosis. There are multiple areas of abnormal T2 signal within the cord, most notable in the C2 through C5 region. This consistent with multiple sclerosis involvement of the cord. After contrast administration, there is some enhancement at the C3 and C4 level.  MRI thoracic spine with an without: Alignment is normal. No degenerative change or stenosis. There are multifocal areas of abnormal T2 signal  within the thoracic spinal cord, throughout the extent of the thoracic region. This is most pronounced in the region from T6 through T10, but there is also involvement of the distal cord at the T11 and T12 level. Many of these areas show contrast enhancement.  IMPRESSION: Extensive and advanced white matter lesions throughout the brain and throughout the cervical and thoracic cord consistent with a fulminant presentation of multiple sclerosis. None of the brain lesions enhance, but many of the spinal cord lesions do show  contrast enhancement.   Electronically Signed   By: Paulina Fusi M.D.   On: 06/27/2013 15:19     EKG Interpretation None      MDM   Final diagnoses:  Multiple sclerosis    History 1 g IV Solu-Medrol. Results of MRI of her head, cervical, thoracic spine noted and discussed with patient. Plan will be referral. Given information regarding neurology at the [facility]. Discussed case with Dr. Cyril Mourning our neurologist recommended initiating steroid treatment. Patient is asymptomatic currently I do not feel she needs admission. Given information education regarding symptoms for which to return.    Rolland Porter, MD 06/27/13 1636  Rolland Porter, MD 06/27/13 [phone]

## 2013-07-11 ENCOUNTER — Emergency Department (HOSPITAL_COMMUNITY): Payer: Medicaid Other

## 2013-07-11 ENCOUNTER — Encounter (HOSPITAL_COMMUNITY): Payer: Self-pay | Admitting: Emergency Medicine

## 2013-07-11 ENCOUNTER — Emergency Department (HOSPITAL_COMMUNITY)
Admission: EM | Admit: 2013-07-11 | Discharge: 2013-07-11 | Disposition: A | Discharge status: Home / Self Care | Payer: Medicaid Other | Attending: Emergency Medicine | Admitting: Emergency Medicine

## 2013-07-11 DIAGNOSIS — Z3202 Encounter for pregnancy test, result negative: Secondary | ICD-10-CM | POA: Diagnosis not present

## 2013-07-11 DIAGNOSIS — K219 Gastro-esophageal reflux disease without esophagitis: Secondary | ICD-10-CM | POA: Insufficient documentation

## 2013-07-11 DIAGNOSIS — Z8669 Personal history of other diseases of the nervous system and sense organs: Secondary | ICD-10-CM | POA: Diagnosis not present

## 2013-07-11 DIAGNOSIS — Z79899 Other long term (current) drug therapy: Secondary | ICD-10-CM | POA: Insufficient documentation

## 2013-07-11 DIAGNOSIS — R630 Anorexia: Secondary | ICD-10-CM | POA: Diagnosis not present

## 2013-07-11 DIAGNOSIS — F3289 Other specified depressive episodes: Secondary | ICD-10-CM | POA: Insufficient documentation

## 2013-07-11 DIAGNOSIS — F329 Major depressive disorder, single episode, unspecified: Secondary | ICD-10-CM | POA: Diagnosis not present

## 2013-07-11 DIAGNOSIS — F17200 Nicotine dependence, unspecified, uncomplicated: Secondary | ICD-10-CM | POA: Diagnosis not present

## 2013-07-11 DIAGNOSIS — IMO0002 Reserved for concepts with insufficient information to code with codable children: Secondary | ICD-10-CM | POA: Diagnosis not present

## 2013-07-11 DIAGNOSIS — R1013 Epigastric pain: Secondary | ICD-10-CM | POA: Diagnosis present

## 2013-07-11 HISTORY — DX: Multiple sclerosis: G35

## 2013-07-11 LAB — BASIC METABOLIC PANEL
BUN: 11 mg/dL (ref 6–23)
CO2: 25 mEq/L (ref 19–32)
CREATININE: 0.88 mg/dL (ref 0.50–1.10)
Calcium: 9.4 mg/dL (ref 8.4–10.5)
Chloride: 99 mEq/L (ref 96–112)
GFR, EST NON AFRICAN AMERICAN: 89 mL/min — AB (ref >90)
Glucose, Bld: 97 mg/dL (ref 70–99)
Potassium: 4.1 mEq/L (ref 3.7–5.3)
SODIUM: 139 meq/L (ref 137–147)

## 2013-07-11 LAB — HEPATIC FUNCTION PANEL
ALK PHOS: 80 U/L (ref 39–117)
ALT: 19 U/L (ref 0–35)
AST: 15 U/L (ref 0–37)
Albumin: 3.9 g/dL (ref 3.5–5.2)
Total Bilirubin: <0.2 mg/dL — ABNORMAL LOW (ref 0.3–1.2)
Total Protein: 7.6 g/dL (ref 6.0–8.3)

## 2013-07-11 LAB — POC URINE PREG, ED: PREG TEST UR: NEGATIVE

## 2013-07-11 LAB — I-STAT TROPONIN, ED: TROPONIN I, POC: 0 ng/mL (ref 0.00–0.08)

## 2013-07-11 LAB — CBC
HCT: 43.6 % (ref 36.0–46.0)
Hemoglobin: 14.8 g/dL (ref 12.0–15.0)
MCH: 30.2 pg (ref 26.0–34.0)
MCHC: 33.9 g/dL (ref 30.0–36.0)
MCV: 89 fL (ref 78.0–100.0)
Platelets: 284 10*3/uL (ref 150–400)
RBC: 4.9 MIL/uL (ref 3.87–5.11)
RDW: 13.8 % (ref 11.5–15.5)
WBC: 7.4 10*3/uL (ref 4.0–10.5)

## 2013-07-11 LAB — LIPASE, BLOOD: Lipase: 22 U/L (ref 11–59)

## 2013-07-11 MED ORDER — SODIUM CHLORIDE 0.9 % IV BOLUS (SEPSIS)
1000.0000 mL | Freq: Once | INTRAVENOUS | Status: AC
  Administered 2013-07-11: 1000 mL via INTRAVENOUS

## 2013-07-11 MED ORDER — GI COCKTAIL ~~LOC~~
30.0000 mL | Freq: Once | ORAL | Status: AC
  Administered 2013-07-11: 30 mL via ORAL
  Filled 2013-07-11: qty 30

## 2013-07-11 MED ORDER — OMEPRAZOLE 20 MG PO CPDR: 20.0000 mg | DELAYED_RELEASE_CAPSULE | Freq: Every day | ORAL | Status: DC

## 2013-07-11 NOTE — ED Notes (Signed)
MD at bedside. 

## 2013-07-11 NOTE — ED Notes (Signed)
Pt. Wants family to come back to the room. Receptionist sent 2 people back.

## 2013-07-11 NOTE — ED Provider Notes (Signed)
CSN: [id number]     Arrival date & time 07/11/13  1533 History   First MD Initiated Contact with Patient 07/11/13 1608     Chief Complaint  Patient presents with  . Abdominal Pain     (Consider location/radiation/quality/duration/timing/severity/associated sxs/prior Treatment) Patient is a 28 y.o. female presenting with abdominal pain. The history is provided by the patient.  Abdominal Pain Pain location:  Epigastric Pain quality: aching   Pain radiates to:  Does not radiate Pain severity:  Moderate Onset quality:  Gradual Duration:  2 days Timing:  Intermittent Progression:  Worsening Chronicity:  New Context: eating   Context: not awakening from sleep   Relieved by:  Nothing Worsened by:  Nothing tried Ineffective treatments:  None tried Associated symptoms: anorexia   Associated symptoms: no cough, no diarrhea, no hematemesis, no hematochezia, no melena, no nausea and no vomiting   Associated symptoms comment:  Heartburn Risk factors: no NSAID use and not pregnant     Past Medical History  Diagnosis Date  . No pertinent past medical history   . Depression   . MS (multiple sclerosis)    Past Surgical History  Procedure Laterality Date  . Foot surgery    . Cesarean section     No family history on file. History  Substance Use Topics  . Smoking status: Current Every Day Smoker -- 0.50 packs/day for 5 years    Types: Cigarettes  . Smokeless tobacco: Never Used  . Alcohol Use: Yes     Comment: several times per month   OB History   Grav Para Term Preterm Abortions TAB SAB Ect Mult Living   3 2 2  1  1   2      Review of Systems  Respiratory: Negative for cough.   Gastrointestinal: Positive for abdominal pain and anorexia. Negative for nausea, vomiting, diarrhea, melena, hematochezia and hematemesis.  All other systems reviewed and are negative.     Allergies  Sulfa drugs cross reactors  Home Medications   Prior to Admission medications   Medication  Sig Start Date End Date Taking? Authorizing Provider  baclofen (LIORESAL) 10 MG tablet Take 10 mg by mouth 3 (three) times daily as needed for muscle spasms.   Yes Historical Provider, MD  calcium carbonate (TUMS EX) 750 MG chewable tablet Chew 1 tablet by mouth daily as needed for heartburn.   Yes Historical Provider, MD  eszopiclone (LUNESTA) 1 MG TABS tablet Take 1 tablet (1 mg total) by mouth at bedtime as needed for sleep. Take immediately before bedtime 06/27/13  Yes Rolland PorterMark James, MD  PRESCRIPTION MEDICATION Take 1 tablet by mouth daily. Prescription birth control "Tri-previsem"   Yes Historical Provider, MD  sertraline (ZOLOFT) 100 MG tablet Take 100 mg by mouth daily.    Yes Historical Provider, MD  methylPREDNISolone (MEDROL DOSEPAK) 4 MG tablet 6 po on day 1, decrease by 1 tab per day 06/27/13   Rolland PorterMark James, MD   BP 117/73  Pulse 93  Temp(Src) 99.5 F (37.5 C) (Oral)  Resp 18  Ht 5\' 2"  (1.575 m)  Wt 210 lb (95.255 kg)  BMI 38.40 kg/m2  SpO2 98%  LMP 06/13/2013 Physical Exam  Constitutional: She is oriented to person, place, and time. She appears well-developed and well-nourished. No distress.  HENT:  Head: Normocephalic.  Eyes: Conjunctivae are normal.  Neck: Neck supple. No tracheal deviation present.  Cardiovascular: Normal rate and regular rhythm.   Pulmonary/Chest: Effort normal. No respiratory distress.  Abdominal: Soft.  Normal appearance. She exhibits no distension. There is tenderness (mild epigastric). There is no rigidity, no rebound, no guarding, no CVA tenderness, no tenderness at McBurney's point and negative Murphy's sign.  Neurological: She is alert and oriented to person, place, and time.  Skin: Skin is warm and dry.  Psychiatric: She has a normal mood and affect.    ED Course  Procedures (including critical care time) Labs Review Labs Reviewed  BASIC METABOLIC PANEL - Abnormal; Notable for the following:    GFR calc non Af Amer 89 (*)    All other components  within normal limits  HEPATIC FUNCTION PANEL - Abnormal; Notable for the following:    Total Bilirubin <0.2 (*)    All other components within normal limits  CBC  LIPASE, BLOOD  I-STAT TROPOININ, ED  POC URINE PREG, ED    Imaging Review Dg Chest 2 View (if Patient Has Fever And/or Copd)  07/11/2013   CLINICAL DATA:  Lower chest and upper abdominal pain for 2 days  EXAM: CHEST  2 VIEW  COMPARISON:  None.  FINDINGS: The heart size and mediastinal contours are within normal limits. Both lungs are clear. The visualized skeletal structures are unremarkable.  IMPRESSION: No active cardiopulmonary disease.   Electronically Signed   By: Esperanza Heir M.D.   On: 07/11/2013 16:22     EKG Interpretation   Date/Time:  Saturday Jul 11 2013 15:49:28 EDT Ventricular Rate:  132 PR Interval:  138 QRS Duration: 88 QT Interval:  296 QTC Calculation: 438 R Axis:   15 Text Interpretation:  Sinus tachycardia Moderate voltage criteria for LVH,  may be normal variant Nonspecific T wave abnormality Abnormal ECG Since  last tracing rate faster Confirmed by MILLER  MD, BRIAN ([id number]) on  07/11/2013 4:39:48 PM      MDM   Final diagnoses:  None    28 y.o. female presents with abdominal pain starting 2 days ago that is in her epigastric region and has been radiating over to the left side of her abdomen. She is also noted some increased gastric reflux symptoms with heartburn and discomfort. She states that she's been fried and spicy foods over the same period of time. She has taken nothing for stomach acid over the course of her illness and has only been taking her baseline medications. Her abdominal exam is completely benign and the pain distribution and quality of symptoms is consistent with a mild gastritis or worsening reflux esophagitis but the patient is not febrile and shows no signs of cholecystitis or appendicitis and has no lower abdominal tenderness or pain. No evidence of pancreatitis. I discussed  supportive treatment and prescribed omeprazole for the patient to take. She admits to decreased by mouth intake and states that her urine has been more concentrated increasingly in the last day so she was given IV fluids for mild dehydration. No dysuria or other urinary symptoms. She was given a GI cocktail with viscous lidocaine in the emergency department with some relief of symptoms and will be asked to followup with her primary care physician this week for recheck of her abdomen. Patient was initially tachycardic on arrival but improved following IV fluid administration. I believe that this was related to a combination of pain and mild dehydration and is now corrected.     Lyndal Pulley, MD 07/11/13 [phone]

## 2013-07-11 NOTE — ED Notes (Signed)
Received pt via EMS with c/o eipgastric pain intermittent for several days.

## 2013-07-11 NOTE — Discharge Instructions (Signed)
Please follow up with your primary care physician for recheck of her abdomen this week.  Diet for Gastroesophageal Reflux Disease, Adult Reflux (acid reflux) is when acid from your stomach flows up into the esophagus. When acid comes in contact with the esophagus, the acid causes irritation and soreness (inflammation) in the esophagus. When reflux happens often or so severely that it causes damage to the esophagus, it is called gastroesophageal reflux disease (GERD). Nutrition therapy can help ease the discomfort of GERD. FOODS OR DRINKS TO AVOID OR LIMIT  Smoking or chewing tobacco. Nicotine is one of the most potent stimulants to acid production in the gastrointestinal tract.  Caffeinated and decaffeinated coffee and black tea.  Regular or low-calorie carbonated beverages or energy drinks (caffeine-free carbonated beverages are allowed).   Strong spices, such as black pepper, white pepper, red pepper, cayenne, curry powder, and chili powder.  Peppermint or spearmint.  Chocolate.  High-fat foods, including meats and fried foods. Extra added fats including oils, butter, salad dressings, and nuts. Limit these to less than 8 tsp per day.  Fruits and vegetables if they are not tolerated, such as citrus fruits or tomatoes.  Alcohol.  Any food that seems to aggravate your condition. If you have questions regarding your diet, call your caregiver or a registered dietitian. OTHER THINGS THAT MAY HELP GERD INCLUDE:   Eating your meals slowly, in a relaxed setting.  Eating 5 to 6 small meals per day instead of 3 large meals.  Eliminating food for a period of time if it causes distress.  Not lying down until 3 hours after eating a meal.  Keeping the head of your bed raised 6 to 9 inches (15 to 23 cm) by using a foam wedge or blocks under the legs of the bed. Lying flat may make symptoms worse.  Being physically active. Weight loss may be helpful in reducing reflux in overweight or obese  adults.  Wear loose fitting clothing EXAMPLE MEAL PLAN This meal plan is approximately 2,000 calories based on https://www.[facility].org/ meal planning guidelines. Breakfast   cup cooked oatmeal.  1 cup strawberries.  1 cup low-fat milk.  1 oz almonds. Snack  1 cup cucumber slices.  6 oz yogurt (made from low-fat or fat-free milk). Lunch  2 slice whole-wheat bread.  2 oz sliced [place].  2 tsp mayonnaise.  1 cup blueberries.  1 cup snap peas. Snack  6 whole-wheat crackers.  1 oz string cheese. Dinner   cup brown rice.  1 cup mixed veggies.  1 tsp olive oil.  3 oz grilled fish. Document Released: 02/19/2005 Document Revised: 05/14/2011 Document Reviewed: 01/05/2011 [facility] Patient Information 2014 [place], [place].

## 2013-07-11 NOTE — ED Notes (Signed)
Per pt. She does not want family back in the room.

## 2013-07-12 NOTE — ED Provider Notes (Signed)
Upper abdominal pain, minimal radiation to back - does not get worse with eating but does not worse at night - on exam has soft abd with minimal epigastric ttp.  No other acute findings on exam.  Labs unremarkable, likley gastritis, unlikley pancreatitis / cholecystitis or supradiaphragmatic disease, will give GI referral, dietary restrictions and meds for PUD / gastritis, pt in agreements.   EKG Interpretation  Date/Time:  Saturday Jul 11 2013 15:49:28 EDT Ventricular Rate:  132 PR Interval:  138 QRS Duration: 88 QT Interval:  296 QTC Calculation: 438 R Axis:   15 Text Interpretation:  Sinus tachycardia Moderate voltage criteria for LVH, may be normal variant Nonspecific T wave abnormality Abnormal ECG Since last tracing rate faster Confirmed by Maleeya Peterkin  MD, Katalyna Socarras ([id number]) on 07/11/2013 4:39:48 PM      I saw and evaluated the patient, reviewed the resident's note and I agree with the findings and plan.   Vida Roller, MD 07/12/13 1153

## 2013-10-05 ENCOUNTER — Encounter (HOSPITAL_COMMUNITY): Payer: Self-pay | Admitting: Emergency Medicine

## 2013-10-05 ENCOUNTER — Emergency Department (HOSPITAL_COMMUNITY)
Admission: EM | Admit: 2013-10-05 | Discharge: 2013-10-05 | Discharge status: Against Medical Advice | Payer: Medicaid Other | Attending: Emergency Medicine | Admitting: Emergency Medicine

## 2013-10-05 DIAGNOSIS — F17200 Nicotine dependence, unspecified, uncomplicated: Secondary | ICD-10-CM | POA: Insufficient documentation

## 2013-10-05 DIAGNOSIS — R3589 Other polyuria: Secondary | ICD-10-CM | POA: Diagnosis not present

## 2013-10-05 DIAGNOSIS — M545 Low back pain, unspecified: Secondary | ICD-10-CM | POA: Insufficient documentation

## 2013-10-05 DIAGNOSIS — R358 Other polyuria: Secondary | ICD-10-CM | POA: Insufficient documentation

## 2013-10-05 NOTE — ED Notes (Signed)
Pt not in WR when called for VS reassessment.

## 2013-10-05 NOTE — ED Notes (Addendum)
Pt c/o bilateral lower back pain and urinary frequency for "a couple days."  Pain score 6/10.  Denies injury an dysuria.  Pt reports doing "nothing" when pain began.

## 2013-10-05 NOTE — ED Notes (Signed)
Called Pt to obtain a new set of vitals.  No answer.

## 2013-10-05 NOTE — ED Notes (Signed)
Called to take to treatment room  No response from lobby 

## 2014-01-04 ENCOUNTER — Encounter (HOSPITAL_COMMUNITY): Payer: Self-pay | Admitting: Emergency Medicine

## 2014-01-27 ENCOUNTER — Emergency Department (HOSPITAL_COMMUNITY)
Admission: EM | Admit: 2014-01-27 | Discharge: 2014-01-27 | Disposition: A | Discharge status: Home / Self Care | Payer: Medicaid Other | Attending: Emergency Medicine | Admitting: Emergency Medicine

## 2014-01-27 ENCOUNTER — Encounter (HOSPITAL_COMMUNITY): Payer: Self-pay | Admitting: Emergency Medicine

## 2014-01-27 DIAGNOSIS — Z720 Tobacco use: Secondary | ICD-10-CM | POA: Insufficient documentation

## 2014-01-27 DIAGNOSIS — R202 Paresthesia of skin: Secondary | ICD-10-CM

## 2014-01-27 DIAGNOSIS — R270 Ataxia, unspecified: Secondary | ICD-10-CM

## 2014-01-27 DIAGNOSIS — G35 Multiple sclerosis: Secondary | ICD-10-CM | POA: Diagnosis not present

## 2014-01-27 DIAGNOSIS — F329 Major depressive disorder, single episode, unspecified: Secondary | ICD-10-CM | POA: Diagnosis not present

## 2014-01-27 DIAGNOSIS — Z79899 Other long term (current) drug therapy: Secondary | ICD-10-CM | POA: Diagnosis not present

## 2014-01-27 DIAGNOSIS — R200 Anesthesia of skin: Secondary | ICD-10-CM | POA: Diagnosis present

## 2014-01-27 NOTE — ED Notes (Signed)
Pt was dx w/ MS in April.  States that x 2 months she has had numbness in bilateral fingers and toes and her balance is off.  Has not seen a doctor since.

## 2014-01-27 NOTE — ED Provider Notes (Signed)
CSN: [id number]     Arrival date & time 01/27/14  1447 History   First MD Initiated Contact with Patient 01/27/14 1503     Chief Complaint  Patient presents with  . Numbness     (Consider location/radiation/quality/duration/timing/severity/associated sxs/prior Treatment) HPI Comments: Patient with a history of recently diagnosed MS presents today with a chief complaint of difficulty with balance and numbness/tingling of all extremities.  She reports that symptoms have been present for the past 7 months and have been worsening over the past 1-2 months.  Nothing is different today.  She is followed by Neurology at [facility].  She is currently on Baclofen and Gabapentin, but does not feel that it is helping.  She was on Avenox, but stopped taking this medication in October due to side effects.  She saw her Neurologist on 01-12-14 and at that time they recommended that she start Tysabri.  She states that she never started this medication because she was concerned about the potential side effects.  She denies any headache, vision changes, neck pain, fever, chills, new weakness, or bowel/bladder incontinence.  She currently ambulates with a cane.    The history is provided by the patient.    Past Medical History  Diagnosis Date  . No pertinent past medical history   . Depression   . MS (multiple sclerosis)    Past Surgical History  Procedure Laterality Date  . Foot surgery    . Cesarean section     No family history on file. History  Substance Use Topics  . Smoking status: Current Every Day Smoker -- 0.50 packs/day for 5 years    Types: Cigarettes  . Smokeless tobacco: Never Used  . Alcohol Use: Yes     Comment: several times per month   OB History    Gravida Para Term Preterm AB TAB SAB Ectopic Multiple Living   3 2 2  1  1   2      Review of Systems  All other systems reviewed and are negative.     Allergies  Sulfa drugs cross reactors  Home Medications   Prior to  Admission medications   Medication Sig Start Date End Date Taking? Authorizing Provider  baclofen (LIORESAL) 10 MG tablet Take 10 mg by mouth 3 (three) times daily as needed for muscle spasms.    Historical Provider, MD  calcium carbonate (TUMS EX) 750 MG chewable tablet Chew 1 tablet by mouth daily as needed for heartburn.    Historical Provider, MD  eszopiclone (LUNESTA) 1 MG TABS tablet Take 1 tablet (1 mg total) by mouth at bedtime as needed for sleep. Take immediately before bedtime 06/27/13   Rolland Porter, MD  methylPREDNISolone (MEDROL DOSEPAK) 4 MG tablet 6 po on day 1, decrease by 1 tab per day 06/27/13   Rolland Porter, MD  omeprazole (PRILOSEC) 20 MG capsule Take 1 capsule (20 mg total) by mouth daily. 07/11/13   Lyndal Pulley, MD  PRESCRIPTION MEDICATION Take 1 tablet by mouth daily. Prescription birth control "Tri-previsem"    Historical Provider, MD  sertraline (ZOLOFT) 100 MG tablet Take 100 mg by mouth daily.     Historical Provider, MD   BP 130/76 mmHg  Pulse 90  Temp(Src) 98.4 F (36.9 C) (Oral)  Resp 17  SpO2 98% Physical Exam  Constitutional: She appears well-developed and well-nourished.  HENT:  Head: Normocephalic and atraumatic.  Mouth/Throat: Oropharynx is clear and moist.  Eyes: EOM are normal. Pupils are equal, round, and reactive  to light.  Neck: Normal range of motion. Neck supple.  Cardiovascular: Normal rate, regular rhythm and normal heart sounds.   Pulmonary/Chest: Effort normal and breath sounds normal.  Musculoskeletal: Normal range of motion.  Neurological: She is alert. No cranial nerve deficit.  Muscle strength 5/5 upper extremities bilaterally Muscle strength 5/5 right lower extremity Muscle strength 4/5 left lower extremities Sensation of left lower extremity mildly decreased Sensation of RLE intact Sensation of both upper extremities bilaterally intact. Normal rapid alternating movements Gait wide based and ataxic  Skin: Skin is warm and dry.   Psychiatric: She has a normal mood and affect.  Nursing note and vitals reviewed.   ED Course  Procedures (including critical care time) Labs Review Labs Reviewed - No data to display  Imaging Review No results found.   EKG Interpretation None     3:45 PM Discussed with [facility].  He states that he would not recommend doing another course of steroid.  He also states that he does not have any further medication recommendations.  Patient [facility]. MDM   Final diagnoses:  None   Patient with a history of MS presents today with numbness/tingling of extremities bilaterally and ataxia.  This has been present since she was diagnosed with MS in April 2015.  Reviewed note from [facility] Neurologist [facility] through Care Everywhere.  She was seen on 01/12/14.  At that time she was started on a 5 day course of steroids and recommended Tysabri.  However, the patient stated that she has not started taking the medication and will not due to concern for side effects.  She does not have any new symptoms today.  No vision changes or any evidence of Optic Neuritis.  [facility] and they did not have any further recommendations.   Feel that the patient is stable for discharge.  Patient instructed to follow up with her Neurologist.  Return precautions given.    Santiago GladHeather Marcela Alatorre, PA-C 01/27/14 1713  Audree CamelScott T Goldston, MD 01/28/14 [phone]

## 2014-01-27 NOTE — ED Notes (Signed)
Pt alert and oriented x4. Respirations even and unlabored, bilateral symmetrical rise and fall of chest. Skin warm and dry. In no acute distress. Denies needs.   

## 2014-02-05 ENCOUNTER — Ambulatory Visit (INDEPENDENT_AMBULATORY_CARE_PROVIDER_SITE_OTHER): Payer: Medicaid Other | Admitting: Neurology

## 2014-02-05 ENCOUNTER — Telehealth: Payer: Self-pay | Admitting: *Deleted

## 2014-02-05 ENCOUNTER — Encounter: Payer: Self-pay | Admitting: Neurology

## 2014-02-05 DIAGNOSIS — G35 Multiple sclerosis: Secondary | ICD-10-CM

## 2014-02-05 LAB — CBC WITH DIFFERENTIAL/PLATELET
Basophils Absolute: 0 10*3/uL (ref 0.0–0.1)
Basophils Relative: 0 % (ref 0–1)
Eosinophils Absolute: 0.2 10*3/uL (ref 0.0–0.7)
Eosinophils Relative: 3 % (ref 0–5)
HEMATOCRIT: 38.9 % (ref 36.0–46.0)
HEMOGLOBIN: 12.9 g/dL (ref 12.0–15.0)
LYMPHS PCT: 30 % (ref 12–46)
Lymphs Abs: 2.4 10*3/uL (ref 0.7–4.0)
MCH: 29.5 pg (ref 26.0–34.0)
MCHC: 33.2 g/dL (ref 30.0–36.0)
MCV: 88.8 fL (ref 78.0–100.0)
MPV: 9.1 fL — AB (ref 9.4–12.4)
Monocytes Absolute: 0.7 10*3/uL (ref 0.1–1.0)
Monocytes Relative: 9 % (ref 3–12)
Neutro Abs: 4.6 10*3/uL (ref 1.7–7.7)
Neutrophils Relative %: 58 % (ref 43–77)
Platelets: 315 10*3/uL (ref 150–400)
RBC: 4.38 MIL/uL (ref 3.87–5.11)
RDW: 14.6 % (ref 11.5–15.5)
WBC: 8 10*3/uL (ref 4.0–10.5)

## 2014-02-05 LAB — COMPLETE METABOLIC PANEL WITH GFR
ALBUMIN: 4 g/dL (ref 3.5–5.2)
ALT: 9 U/L (ref 0–35)
AST: 11 U/L (ref 0–37)
Alkaline Phosphatase: 41 U/L (ref 39–117)
BUN: 8 mg/dL (ref 6–23)
CO2: 23 mEq/L (ref 19–32)
Calcium: 9.4 mg/dL (ref 8.4–10.5)
Chloride: 104 mEq/L (ref 96–112)
Creat: 0.61 mg/dL (ref 0.50–1.10)
GFR, Est Non African American: >89 mL/min
Glucose, Bld: 83 mg/dL (ref 70–99)
Potassium: 4.1 mEq/L (ref 3.5–5.3)
SODIUM: 135 meq/L (ref 135–145)
TOTAL PROTEIN: 6.4 g/dL (ref 6.0–8.3)
Total Bilirubin: 0.3 mg/dL (ref 0.2–1.2)

## 2014-02-05 NOTE — Patient Instructions (Signed)
1.  We will set you up for Tecfidera 2.  We will check CBC and CMP.  In 3 months after starting Tecfidera, we will repeat these labs and for you to follow up right afterwards. 3.  Continue the gabapentin and baclofen

## 2014-02-05 NOTE — Telephone Encounter (Signed)
Patient in reference to a RX please advise Call back number [phone]

## 2014-02-05 NOTE — Progress Notes (Signed)
NEUROLOGY CONSULTATION NOTE  Cheryl HartLasha A Bright MRN: [id number] DOB: 1985-04-03  Referring provider: Elizabeth Palaueresa Anderson, FNP Primary care provider: Elizabeth Palaueresa Anderson, FNP  Reason for consult:  MS  HISTORY OF PRESENT ILLNESS: Cheryl Bright is a 28 year old right-handed woman with anxiety who presents to establish care for multiple sclerosis.  Records, MRI scans from 06/27/13, MRI reports from 01/19/14 and labs reviewed.  She began having episodes of numbness and tingling beginning in December 2014.  These episodes were accompanied by weakness.  At first, she thought they were side effects to an antifungal mediction.  She also started having trouble with gait.  She presented to the ED on 06/27/13 for an episode involving leg weakness, gait instability, tingling in the legs and burning in her left hip.  B12 was 1086.  MRI of the brain, cervical and thoracic spine with and without contrast was personally reviewed and showed extensive T2 white matter lesions involving the brain and cervical and thoracic cord.  Several lesions in the cord showed enhancement.  She was given 1gm of Solumedrol in the ED with steroid dose pack and referral to neurology.  She established care with Dr. Leta SpellerNuhad Elias Abou Zeid [facility].  Plan was to start Tecfidera, but was denied by insurance, so she was started on Avonex instead.  She developed side effects so Avonex was discontinued in September.  She developed worsening balance, leg weakness and tingling in the extremities.  Another MRI of the brain and cervical spine was performed on 01/09/14.  Report reveals increase in size of prior cervical lesion, as well as a new lesion at the T3-T4 level and an enhancing lesion at T1 level.  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  Due to the aggressive course of her disease, Tysabri was still recommended. Other labs from 12/22/13 showed WBC 8.1, HGB 12.7, HCT 39.6, PLT 280, TB 0.4, DB 0.1, ALP 55, AST 13 and  ALT 10.  She is no longer in PT.  She is currently not on a disease modifying agent.  She takes Zoloft for anxiety.  Current symptoms include mid-low back pain, for which she takes baclofen 10mg  twice daily, and tingling dysesthesias in the fingers and feet, for which she takes gabapentin 300mg  daily.  She uses a cane to ambulate.  She denies visual disturbance or ocular pain.  Her sister has MS.  PAST MEDICAL HISTORY: Past Medical History  Diagnosis Date  . No pertinent past medical history   . Depression   . MS (multiple sclerosis)     PAST SURGICAL HISTORY: Past Surgical History  Procedure Laterality Date  . Foot surgery    . Cesarean section      MEDICATIONS: Current Outpatient Prescriptions on File Prior to Visit  Medication Sig Dispense Refill  . baclofen (LIORESAL) 10 MG tablet Take 10 mg by mouth 3 (three) times daily as needed for muscle spasms.    . eszopiclone (LUNESTA) 1 MG TABS tablet Take 1 tablet (1 mg total) by mouth at bedtime as needed for sleep. Take immediately before bedtime 5 tablet 0  . PRESCRIPTION MEDICATION Take 1 tablet by mouth daily. Prescription birth control "Tri-previsem"    . sertraline (ZOLOFT) 100 MG tablet Take 100 mg by mouth daily.     . methylPREDNISolone (MEDROL DOSEPAK) 4 MG tablet 6 po on day 1, decrease by 1 tab per day (Patient not taking: Reported on 02/05/2014) 21 tablet 0  . omeprazole (  PRILOSEC) 20 MG capsule Take 1 capsule (20 mg total) by mouth daily. (Patient not taking: Reported on 02/05/2014) 30 capsule 0   No current facility-administered medications on file prior to visit.    ALLERGIES: Allergies  Allergen Reactions  . Sulfa Drugs Cross Reactors Hives and Swelling    FAMILY HISTORY: Family History  Problem Relation Age of Onset  . Multiple sclerosis Sister     SOCIAL HISTORY: History   Social History  . Marital Status: Single    Spouse Name: N/A    Number of Children: N/A  . Years of Education: N/A    Occupational History  . Not on file.   Social History Main Topics  . Smoking status: Current Every Day Smoker -- 0.50 packs/day for 5 years    Types: Cigarettes  . Smokeless tobacco: Never Used  . Alcohol Use: 0.0 oz/week    0 Not specified per week     Comment: several times per month  . Drug Use: Yes  . Sexual Activity:    Partners: Male    Birth Control/ Protection: Pill   Other Topics Concern  . Not on file   Social History Narrative    REVIEW OF SYSTEMS: Constitutional: No fevers, chills, or sweats, no generalized fatigue, change in appetite Eyes: No visual changes, double vision, eye pain Ear, nose and throat: No hearing loss, ear pain, nasal congestion, sore throat Cardiovascular: No chest pain, palpitations Respiratory:  No shortness of breath at rest or with exertion, wheezes GastrointestinaI: No nausea, vomiting, diarrhea, abdominal pain, fecal incontinence Genitourinary:  No dysuria, urinary retention or frequency Musculoskeletal:  No neck pain, back pain Integumentary: No rash, pruritus, skin lesions Neurological: as above Psychiatric: No depression, insomnia, anxiety Endocrine: No palpitations, fatigue, diaphoresis, mood swings, change in appetite, change in weight, increased thirst Hematologic/Lymphatic:  No anemia, purpura, petechiae. Allergic/Immunologic: no itchy/runny eyes, nasal congestion, recent allergic reactions, rashes  PHYSICAL EXAM: Filed Vitals:   02/05/14 0949  BP: 104/66  Pulse: 74  Temp: 98.7 F (37.1 C)  Resp: 18   General: No acute distress Head:  Normocephalic/atraumatic Eyes:  fundi unremarkable, without vessel changes, exudates, hemorrhages or papilledema. Neck: supple, no paraspinal tenderness, full range of motion Back: No paraspinal tenderness Heart: regular rate and rhythm Lungs: Clear to auscultation bilaterally. Vascular: No carotid bruits. Neurological Exam: Mental status: alert and oriented to person, place, and  time, recent and remote memory intact, fund of knowledge intact, attention and concentration intact, speech fluent and not dysarthric, language intact. Cranial nerves: CN I: not tested CN II: pupils equal, round and reactive to light, visual fields intact, fundi unremarkable, without vessel changes, exudates, hemorrhages or papilledema. CN III, IV, VI:  full range of motion, no nystagmus, no ptosis CN V: facial sensation intact CN VII: upper and lower face symmetric CN VIII: hearing intact CN IX, X: gag intact, uvula midline CN XI: sternocleidomastoid and trapezius muscles intact CN XII: tongue midline Bulk & Tone: normal, no fasciculations. Motor:  Maybe 5-/5 in the quads, but could be decreased effort.  Otherwise, 5/5. Sensation:  Reduced vibration sensation in the fingers worse than the toes.  Pinprick sensation intact. Deep Tendon Reflexes:  2+ throughout, toes downgoing. Finger to nose testing:  No dysmetria Heel to shin:  No dysmetria Gait:  Cautious gait with slight right leg limp.  Unable to tandem walk. Romberg negative.  IMPRESSION: Multiple sclerosis.  It could be relapsing-remitting, however, if she continues to have progression of symptoms without any  return to baseline, need to consider primary progressive MS.    We went over in detail the various MS drugs, discussing their risks and benefits.  We discussed the risk of PML in Tysabri, as well as the rare documented cases in [place] and Tecfidera.  At this point, she would like to try Tecfidera before considering Tysabri.  PLAN: 1.  Will set up for Tecfidera. 2.  Get baseline CBC and CMP.  Repeat in 3 months. 3.  If stable, will repeat MRI of brain, cervical and thoracic spine with and without contrast in 6 months. 4.  Gabapentin for neuropathic pain and baclofen for back muscle spasms 5.  Use of cane for ambulation 6.  Provide information on the various MS disease-modifying agents. 7.  Follow up in 3 months.  Thank you  for allowing me to take part in the care of this patient.  Shon Millet, DO  CC:  Elizabeth Palau, FNP

## 2014-02-08 ENCOUNTER — Telehealth: Payer: Self-pay | Admitting: *Deleted

## 2014-02-08 NOTE — Telephone Encounter (Signed)
Did not leave message on answering machine

## 2014-02-23 ENCOUNTER — Telehealth: Payer: Self-pay | Admitting: *Deleted

## 2014-02-23 NOTE — Telephone Encounter (Signed)
[facility] pharmacy with tecfidera called to inform upon trying to make a delivery to this patient the patient informed her that she is now pregnant so the medication is on hold until futher notice from patient and Dr Lujean Rave

## 2014-02-23 NOTE — Telephone Encounter (Signed)
She cannot be on a disease-modifying agent while pregnant.  Being pregnant may be protective against MS, anyway.

## 2014-02-23 NOTE — Telephone Encounter (Signed)
Left message for patient to return call to office regarding medication  

## 2014-03-05 NOTE — L&D Delivery Note (Signed)
Delivery Note At 6:43 PM a viable female was delivered via  (Presentation: ;  ).  APGAR: , ; weight  .   Placenta status: , .  Cord:  with the following complications: .  Cord pH: not done  Anesthesia: Epidural  Episiotomy:   Lacerations:   Suture Repair: 2.0 vicryl Est. Blood Loss (mL):    Mom to postpartum.  Baby to Couplet care / Skin to Skin.  Cheryl Bright A 10/02/2014, 6:50 PM

## 2014-03-16 LAB — PROCEDURE REPORT - SCANNED: PAP SMEAR: NEGATIVE

## 2014-03-20 ENCOUNTER — Inpatient Hospital Stay (HOSPITAL_COMMUNITY)
Admission: AD | Admit: 2014-03-20 | Discharge: 2014-03-20 | Disposition: A | Discharge status: Home / Self Care | Payer: Medicaid Other | Source: Ambulatory Visit | Attending: Obstetrics | Admitting: Obstetrics

## 2014-03-20 ENCOUNTER — Encounter (HOSPITAL_COMMUNITY): Payer: Self-pay | Admitting: *Deleted

## 2014-03-20 ENCOUNTER — Inpatient Hospital Stay (HOSPITAL_COMMUNITY): Payer: Medicaid Other

## 2014-03-20 DIAGNOSIS — Z720 Tobacco use: Secondary | ICD-10-CM | POA: Diagnosis not present

## 2014-03-20 DIAGNOSIS — O209 Hemorrhage in early pregnancy, unspecified: Secondary | ICD-10-CM | POA: Insufficient documentation

## 2014-03-20 DIAGNOSIS — Z3A12 12 weeks gestation of pregnancy: Secondary | ICD-10-CM | POA: Insufficient documentation

## 2014-03-20 LAB — WET PREP, GENITAL
TRICH WET PREP: NONE SEEN
YEAST WET PREP: NONE SEEN

## 2014-03-20 LAB — ABO/RH: ABO/RH(D): O POS

## 2014-03-20 NOTE — MAU Note (Signed)
Pt states here for spotting noted with wiping only.

## 2014-03-20 NOTE — MAU Provider Note (Signed)
History     CSN: [id number]  Arrival date and time: 03/20/14 1723   First Provider Initiated Contact with Patient 03/20/14 1838      Chief Complaint  Patient presents with  . Vaginal Bleeding   HPI   [name] is a 29 y.o.female who presents with vaginal spotting that she noticed this morning and then again this afternoon. She noticed it only when she wiped. She had intercourse 2 days ago and did not notice any bleeding after that.  This is the second time she has bleeding in this pregnancy; she went to [facility] and they gave her precautions.   She denies pain She has not had an [place] in this pregnancy    OB History    Gravida Para Term Preterm AB TAB SAB Ectopic Multiple Living   Past Medical History  Diagnosis Date  . No pertinent past medical history   . Depression   . MS (multiple sclerosis)     Past Surgical History  Procedure Laterality Date  . Foot surgery    . Cesarean section      Family History  Problem Relation Age of Onset  . Multiple sclerosis Sister     History  Substance Use Topics  . Smoking status: Current Every Day Smoker -- 0.50 packs/day for 5 years    Types: Cigarettes  . Smokeless tobacco: Never Used  . Alcohol Use: 0.0 oz/week    0 Not specified per week     Comment: several times per month    Allergies:  Allergies  Allergen Reactions  . Sulfa Drugs Cross Reactors Hives and Swelling    Prescriptions prior to admission  Medication Sig Dispense Refill Last Dose  . diphenhydramine-acetaminophen (TYLENOL PM) 25-500 MG TABS Take 1 tablet by mouth at bedtime as needed (for sleep).   Past Week at Unknown time  . Prenatal Vit-Fe Fumarate-FA (PRENATAL MULTIVITAMIN) TABS tablet Take 1 tablet by mouth daily.   03/20/2014 at Unknown time  . baclofen (LIORESAL) 10 MG tablet Take 10 mg by mouth 3 (three) times daily as needed for muscle spasms.   Taking  . eszopiclone (LUNESTA) 1 MG TABS tablet Take 1 tablet (1  mg total) by mouth at bedtime as needed for sleep. Take immediately before bedtime (Patient not taking: Reported on 03/20/2014) 5 tablet 0 Taking  . gabapentin (NEURONTIN) 300 MG capsule Take 300 mg by mouth.     . methylPREDNISolone (MEDROL DOSEPAK) 4 MG tablet 6 po on day 1, decrease by 1 tab per day (Patient not taking: Reported on 02/05/2014) 21 tablet 0 Not Taking  . omeprazole (PRILOSEC) 20 MG capsule Take 1 capsule (20 mg total) by mouth daily. (Patient not taking: Reported on 02/05/2014) 30 capsule 0 Not Taking   Results for orders placed or performed during the hospital encounter of 03/20/14 (from the past 48 hour(s))  Wet prep, genital     Status: Abnormal   Collection Time: 03/20/14  6:48 PM  Result Value Ref Range   Yeast Wet Prep HPF POC NONE SEEN NONE SEEN   Trich, Wet Prep NONE SEEN NONE SEEN   Clue Cells Wet Prep HPF POC FEW (A) NONE SEEN   WBC, Wet Prep HPF POC FEW (A) NONE SEEN    Comment: MANY BACTERIA SEEN    US Ob Comp Less 14 Wks  03/20/2014   CLINICAL DATA:  Vaginal bleeding  EXAM: OBSTETRIC <14 WK ULTRASOUND  TECHNIQUE: Transabdominal ultrasound was performed for evaluation of the gestation as well as the maternal uterus and adnexal regions.  COMPARISON:  None.  FINDINGS: Intrauterine gestational sac: Visualized/normal in shape.  Yolk sac:  Not visualized  Embryo:  Visualized  Cardiac Activity: Visualize  Heart Rate: 170 bpm  MSD:   mm    w     d  CRL:   57.5  mm   12 w 3 d                  [place] EDC: 09/29/2014  Maternal uterus/adnexae: No subchorionic hemorrhage. No adnexal masses. No free fluid.  IMPRESSION: Twelve week 3 day intrauterine pregnancy. Fetal heart rate 170 beats per min. No acute maternal findings.   Electronically Signed   By: Charlett Nose M.D.   On: 03/20/2014 19:54     Review of Systems  Constitutional: Negative for fever and chills.  Gastrointestinal: Negative for nausea, vomiting, abdominal pain, diarrhea and constipation.   Physical Exam   Blood  pressure 116/77, pulse 68, temperature 98 F (36.7 C), temperature source Oral, resp. rate 18, last menstrual period 12/24/2013.  Physical Exam  Constitutional: She is oriented to person, place, and time. She appears well-developed and well-nourished. No distress.  HENT:  Head: Normocephalic.  Eyes: Pupils are equal, round, and reactive to light.  Neck: Neck supple.  Respiratory: Effort normal.  GI: Soft. She exhibits no distension. There is no tenderness. There is no rebound and no guarding.  Genitourinary: Vaginal discharge found.  Speculum exam: Vagina - Small amount of creamy, pink discharge, no odor Cervix - No contact bleeding, no active bleeding  Bimanual exam: Cervix closed, no CMT  Uterus non tender, enlarged  Adnexa non tender, no masses bilaterally GC/Chlam, wet prep done Chaperone present for exam.   Musculoskeletal: Normal range of motion.  Neurological: She is alert and oriented to person, place, and time.  Skin: Skin is warm. She is not diaphoretic.  Psychiatric: Her behavior is normal.    MAU Course  Procedures  None  MDM +fht   Assessment and Plan   A:  1. Vaginal bleeding before [redacted] weeks gestation    P:  Discharge home in stable condition Follow up in 48 hours in the clinic for repeat beta hcg Return to MAU if symptoms worsen First trimester warning signs discussed Bleeding precautions Ectopic precautions Pelvic rest.    Cheryl Hansen Laurelyn Terrero, NP 03/22/2014 3:31 PM

## 2014-03-20 NOTE — Discharge Instructions (Signed)
Vaginal Bleeding During Pregnancy, First Trimester  A small amount of bleeding (spotting) from the vagina is relatively common in early pregnancy. It usually stops on its own. Various things may cause bleeding or spotting in early pregnancy. Some bleeding may be related to the pregnancy, and some may not. In most cases, the bleeding is normal and is not a problem. However, bleeding can also be a sign of something serious. Be sure to tell your health care provider about any vaginal bleeding right away.  Some possible causes of vaginal bleeding during the first trimester include:  · Infection or inflammation of the cervix.  · Growths (polyps) on the cervix.  · Miscarriage or threatened miscarriage.  · Pregnancy tissue has developed outside of the uterus and in a fallopian tube (tubal pregnancy).  · Tiny cysts have developed in the uterus instead of pregnancy tissue (molar pregnancy).  HOME CARE INSTRUCTIONS   Watch your condition for any changes. The following actions may help to lessen any discomfort you are feeling:  · Follow your health care provider's instructions for limiting your activity. If your health care provider orders bed rest, you may need to stay in bed and only get up to use the bathroom. However, your health care provider may allow you to continue light activity.  · If needed, make plans for someone to help with your regular activities and responsibilities while you are on bed rest.  · Keep track of the number of pads you use each day, how often you change pads, and how soaked (saturated) they are. Write this down.  · Do not use tampons. Do not douche.  · Do not have sexual intercourse or orgasms until approved by your health care provider.  · If you pass any tissue from your vagina, save the tissue so you can show it to your health care provider.  · Only take over-the-counter or prescription medicines as directed by your health care provider.  · Do not take aspirin because it can make you  bleed.  · Keep all follow-up appointments as directed by your health care provider.  SEEK MEDICAL CARE IF:  · You have any vaginal bleeding during any part of your pregnancy.  · You have cramps or labor pains.  · You have a fever, not controlled by medicine.  SEEK IMMEDIATE MEDICAL CARE IF:   · You have severe cramps in your back or belly (abdomen).  · You pass large clots or tissue from your vagina.  · Your bleeding increases.  · You feel light-headed or weak, or you have fainting episodes.  · You have chills.  · You are leaking fluid or have a gush of fluid from your vagina.  · You pass out while having a bowel movement.  MAKE SURE YOU:  · Understand these instructions.  · Will watch your condition.  · Will get help right away if you are not doing well or get worse.  Document Released: 11/29/2004 Document Revised: 02/24/2013 Document Reviewed: 10/27/2012  [employer]® Patient Information ©2015 [employer]. This information is not intended to replace advice given to you by your health care provider. Make sure you discuss any questions you have with your health care provider.

## 2014-03-22 LAB — GC/CHLAMYDIA PROBE AMP (~~LOC~~) NOT AT ARMC
CHLAMYDIA, DNA PROBE: NEGATIVE
Neisseria Gonorrhea: NEGATIVE

## 2014-04-02 LAB — OB RESULTS CONSOLE HIV ANTIBODY (ROUTINE TESTING): HIV: NONREACTIVE

## 2014-04-02 LAB — OB RESULTS CONSOLE GC/CHLAMYDIA
Chlamydia: NEGATIVE
Gonorrhea: NEGATIVE

## 2014-04-02 LAB — OB RESULTS CONSOLE RPR: RPR: NONREACTIVE

## 2014-04-02 LAB — OB RESULTS CONSOLE RUBELLA ANTIBODY, IGM: RUBELLA: IMMUNE

## 2014-04-02 LAB — OB RESULTS CONSOLE ABO/RH: RH TYPE: POSITIVE

## 2014-04-02 LAB — OB RESULTS CONSOLE ANTIBODY SCREEN: Antibody Screen: NEGATIVE

## 2014-04-02 LAB — OB RESULTS CONSOLE HEPATITIS B SURFACE ANTIGEN: HEP B S AG: NEGATIVE

## 2014-05-06 ENCOUNTER — Other Ambulatory Visit (HOSPITAL_COMMUNITY): Payer: Self-pay | Admitting: Obstetrics

## 2014-05-06 DIAGNOSIS — Z3A19 19 weeks gestation of pregnancy: Secondary | ICD-10-CM

## 2014-05-06 DIAGNOSIS — Z3689 Encounter for other specified antenatal screening: Secondary | ICD-10-CM

## 2014-05-06 DIAGNOSIS — O99352 Diseases of the nervous system complicating pregnancy, second trimester: Secondary | ICD-10-CM

## 2014-05-06 DIAGNOSIS — G35 Multiple sclerosis: Secondary | ICD-10-CM

## 2014-05-07 ENCOUNTER — Ambulatory Visit (HOSPITAL_COMMUNITY)
Admission: RE | Admit: 2014-05-07 | Discharge: 2014-05-07 | Disposition: A | Discharge status: Home / Self Care | Payer: Medicaid Other | Source: Ambulatory Visit | Attending: Obstetrics | Admitting: Obstetrics

## 2014-05-07 ENCOUNTER — Encounter (HOSPITAL_COMMUNITY): Payer: Self-pay

## 2014-05-07 DIAGNOSIS — Z3A19 19 weeks gestation of pregnancy: Secondary | ICD-10-CM | POA: Insufficient documentation

## 2014-05-07 DIAGNOSIS — O99352 Diseases of the nervous system complicating pregnancy, second trimester: Secondary | ICD-10-CM | POA: Diagnosis not present

## 2014-05-07 DIAGNOSIS — Z36 Encounter for antenatal screening of mother: Secondary | ICD-10-CM | POA: Insufficient documentation

## 2014-05-07 DIAGNOSIS — G35 Multiple sclerosis: Secondary | ICD-10-CM | POA: Diagnosis not present

## 2014-05-07 DIAGNOSIS — IMO0001 Reserved for inherently not codable concepts without codable children: Secondary | ICD-10-CM | POA: Insufficient documentation

## 2014-05-07 DIAGNOSIS — Z3689 Encounter for other specified antenatal screening: Secondary | ICD-10-CM | POA: Insufficient documentation

## 2014-08-23 LAB — OB RESULTS CONSOLE GC/CHLAMYDIA
Chlamydia: NEGATIVE
Gonorrhea: NEGATIVE

## 2014-08-23 LAB — OB RESULTS CONSOLE GBS: STREP GROUP B AG: POSITIVE

## 2014-08-29 ENCOUNTER — Encounter (HOSPITAL_COMMUNITY): Payer: Self-pay | Admitting: *Deleted

## 2014-08-29 ENCOUNTER — Inpatient Hospital Stay (HOSPITAL_COMMUNITY)
Admission: AD | Admit: 2014-08-29 | Discharge: 2014-08-29 | Disposition: A | Discharge status: Home / Self Care | Payer: Medicaid Other | Source: Ambulatory Visit | Attending: Obstetrics | Admitting: Obstetrics

## 2014-08-29 DIAGNOSIS — Z3A35 35 weeks gestation of pregnancy: Secondary | ICD-10-CM | POA: Diagnosis not present

## 2014-08-29 DIAGNOSIS — A499 Bacterial infection, unspecified: Secondary | ICD-10-CM | POA: Diagnosis not present

## 2014-08-29 DIAGNOSIS — B9689 Other specified bacterial agents as the cause of diseases classified elsewhere: Secondary | ICD-10-CM | POA: Insufficient documentation

## 2014-08-29 DIAGNOSIS — F17210 Nicotine dependence, cigarettes, uncomplicated: Secondary | ICD-10-CM | POA: Diagnosis not present

## 2014-08-29 DIAGNOSIS — O23593 Infection of other part of genital tract in pregnancy, third trimester: Secondary | ICD-10-CM | POA: Insufficient documentation

## 2014-08-29 DIAGNOSIS — N760 Acute vaginitis: Secondary | ICD-10-CM | POA: Diagnosis not present

## 2014-08-29 DIAGNOSIS — O99333 Smoking (tobacco) complicating pregnancy, third trimester: Secondary | ICD-10-CM | POA: Diagnosis not present

## 2014-08-29 DIAGNOSIS — O26853 Spotting complicating pregnancy, third trimester: Secondary | ICD-10-CM | POA: Diagnosis present

## 2014-08-29 LAB — WET PREP, GENITAL
Trich, Wet Prep: NONE SEEN
Yeast Wet Prep HPF POC: NONE SEEN

## 2014-08-29 MED ORDER — METRONIDAZOLE 500 MG PO TABS: 500.0000 mg | ORAL_TABLET | Freq: Two times a day (BID) | ORAL | Status: DC

## 2014-08-29 NOTE — MAU Provider Note (Signed)
  History     CSN: [id number]  Arrival date and time: 08/29/14 1321   First Provider Initiated Contact with Patient 08/29/14 1346      Chief Complaint  Patient presents with  . Vaginal Bleeding   HPI 29 y.o. [id number] [redacted]w[redacted]d presents to the MAU with the complaint of vaginal spotting for 3 days, denies contractions and loss of fluid.Reports positive fetal movement.  Past Medical History  Diagnosis Date  . No pertinent past medical history   . Depression   . MS (multiple sclerosis)     Past Surgical History  Procedure Laterality Date  . Foot surgery    . Cesarean section      Family History  Problem Relation Age of Onset  . Multiple sclerosis Sister     History  Substance Use Topics  . Smoking status: Current Every Day Smoker -- 0.50 packs/day for 5 years    Types: Cigarettes  . Smokeless tobacco: Never Used  . Alcohol Use: 0.0 oz/week    0 Standard drinks or equivalent per week     Comment: several times per month    Allergies:  Allergies  Allergen Reactions  . Sulfa Drugs Cross Reactors Hives and Swelling    Prescriptions prior to admission  Medication Sig Dispense Refill Last Dose  . diphenhydramine-acetaminophen (TYLENOL PM) 25-500 MG TABS Take 1 tablet by mouth at bedtime as needed (for sleep).   Taking  . gabapentin (NEURONTIN) 300 MG capsule Take 300 mg by mouth.   Not Taking  . Prenatal Vit-Fe Fumarate-FA (PRENATAL MULTIVITAMIN) TABS tablet Take 1 tablet by mouth daily.   Taking    Review of Systems  Constitutional: Negative for fever.  Genitourinary:       Vaginal spotting  All other systems reviewed and are negative.  Physical Exam   Last menstrual period 12/24/2013.  Physical Exam  Nursing note and vitals reviewed. Constitutional: She is oriented to person, place, and time. She appears well-developed and well-nourished. No distress.  HENT:  Head: Normocephalic and atraumatic.  Neck: Normal range of motion.  Cardiovascular: Normal rate.    Respiratory: Effort normal. No respiratory distress.  GI: Soft.  Genitourinary:  Light vaginal bleeding noted on spec exam. Cervix is closed  Musculoskeletal: Normal range of motion. She exhibits no edema.  Neurological: She is alert and oriented to person, place, and time.  Skin: Skin is warm and dry.  Psychiatric: She has a normal mood and affect. Her behavior is normal. Judgment and thought content normal.   Results for orders placed or performed during the hospital encounter of 08/29/14 (from the past 24 hour(s))  Wet prep, genital     Status: Abnormal   Collection Time: 08/29/14  1:57 PM  Result Value Ref Range   Yeast Wet Prep HPF POC NONE SEEN NONE SEEN   Trich, Wet Prep NONE SEEN NONE SEEN   Clue Cells Wet Prep HPF POC MANY (A) NONE SEEN   WBC, Wet Prep HPF POC FEW (A) NONE SEEN   MAU Course  Procedures  MDM WEt prep and Spec Exam. Spoke with Dr Gaynell Face via nurse in OR- Pt will be treated and discharged to home.  Assessment and Plan  Bacterial Vaginitis Flagyl 500mg  bid x7 d  Discharge to home  [facility] 08/29/2014, 1:53 PM

## 2014-08-29 NOTE — MAU Note (Signed)
Urine in lab 

## 2014-08-29 NOTE — MAU Note (Signed)
Pt presents to MAU with complaints of vaginal spotting when she wipes for a couple of days. Last intercourse was on Friday. Denies any LOF. Reports baby is active

## 2014-08-29 NOTE — Discharge Instructions (Signed)
Bacterial Vaginosis Bacterial vaginosis is a vaginal infection that occurs when the normal balance of bacteria in the vagina is disrupted. It results from an overgrowth of certain bacteria. This is the most common vaginal infection in women of childbearing age. Treatment is important to prevent complications, especially in pregnant women, as it can cause a premature delivery. CAUSES  Bacterial vaginosis is caused by an increase in harmful bacteria that are normally present in smaller amounts in the vagina. Several different kinds of bacteria can cause bacterial vaginosis. However, the reason that the condition develops is not fully understood. RISK FACTORS Certain activities or behaviors can put you at an increased risk of developing bacterial vaginosis, including:  Having a new sex partner or multiple sex partners.  Douching.  Using an intrauterine device (IUD) for contraception. Women do not get bacterial vaginosis from toilet seats, bedding, swimming pools, or contact with objects around them. SIGNS AND SYMPTOMS  Some women with bacterial vaginosis have no signs or symptoms. Common symptoms include:  Grey vaginal discharge.  A fishlike odor with discharge, especially after sexual intercourse.  Itching or burning of the vagina and vulva.  Burning or pain with urination. DIAGNOSIS  Your health care provider will take a medical history and examine the vagina for signs of bacterial vaginosis. A sample of vaginal fluid may be taken. Your health care provider will look at this sample under a microscope to check for bacteria and abnormal cells. A vaginal pH test may also be done.  TREATMENT  Bacterial vaginosis may be treated with antibiotic medicines. These may be given in the form of a pill or a vaginal cream. A second round of antibiotics may be prescribed if the condition comes back after treatment.  HOME CARE INSTRUCTIONS   Only take over-the-counter or prescription medicines as  directed by your health care provider.  If antibiotic medicine was prescribed, take it as directed. Make sure you finish it even if you start to feel better.  Do not have sex until treatment is completed.  Tell all sexual partners that you have a vaginal infection. They should see their health care provider and be treated if they have problems, such as a mild rash or itching.  Practice safe sex by using condoms and only having one sex partner. SEEK MEDICAL CARE IF:   Your symptoms are not improving after 3 days of treatment.  You have increased discharge or pain.  You have a fever. MAKE SURE YOU:   Understand these instructions.  Will watch your condition.  Will get help right away if you are not doing well or get worse. FOR MORE INFORMATION  Centers for Disease Control and Prevention, Division of STD Prevention: www.cdc.gov/std American Sexual Health Association (ASHA): www.ashastd.org  Document Released: 02/19/2005 Document Revised: 12/10/2012 Document Reviewed: 10/01/2012 ExitCare Patient Information 2015 [employer]. This information is not intended to replace advice given to you by your health care provider. Make sure you discuss any questions you have with your health care provider.  

## 2014-10-02 ENCOUNTER — Encounter (HOSPITAL_COMMUNITY): Payer: Self-pay

## 2014-10-02 ENCOUNTER — Inpatient Hospital Stay (HOSPITAL_COMMUNITY): Payer: Medicaid Other | Admitting: Anesthesiology

## 2014-10-02 ENCOUNTER — Inpatient Hospital Stay (HOSPITAL_COMMUNITY)
Admission: RE | Admit: 2014-10-02 | Discharge: 2014-10-04 | DRG: 775 | Disposition: A | Discharge status: Home / Self Care | Payer: Medicaid Other | Source: Ambulatory Visit | Attending: Obstetrics | Admitting: Obstetrics

## 2014-10-02 DIAGNOSIS — Z3A40 40 weeks gestation of pregnancy: Secondary | ICD-10-CM | POA: Diagnosis present

## 2014-10-02 DIAGNOSIS — O99354 Diseases of the nervous system complicating childbirth: Secondary | ICD-10-CM | POA: Diagnosis present

## 2014-10-02 DIAGNOSIS — O0990 Supervision of high risk pregnancy, unspecified, unspecified trimester: Secondary | ICD-10-CM

## 2014-10-02 DIAGNOSIS — O3421 Maternal care for scar from previous cesarean delivery: Secondary | ICD-10-CM | POA: Diagnosis present

## 2014-10-02 DIAGNOSIS — G35 Multiple sclerosis: Secondary | ICD-10-CM | POA: Diagnosis present

## 2014-10-02 DIAGNOSIS — F17210 Nicotine dependence, cigarettes, uncomplicated: Secondary | ICD-10-CM | POA: Diagnosis present

## 2014-10-02 DIAGNOSIS — O99824 Streptococcus B carrier state complicating childbirth: Secondary | ICD-10-CM | POA: Diagnosis present

## 2014-10-02 DIAGNOSIS — Z3490 Encounter for supervision of normal pregnancy, unspecified, unspecified trimester: Secondary | ICD-10-CM

## 2014-10-02 DIAGNOSIS — O99334 Smoking (tobacco) complicating childbirth: Secondary | ICD-10-CM | POA: Diagnosis present

## 2014-10-02 DIAGNOSIS — O9989 Other specified diseases and conditions complicating pregnancy, childbirth and the puerperium: Secondary | ICD-10-CM | POA: Diagnosis present

## 2014-10-02 LAB — COMPREHENSIVE METABOLIC PANEL
ALBUMIN: 3.1 g/dL — AB (ref 3.5–5.0)
ALK PHOS: 117 U/L (ref 38–126)
ALT: 9 U/L — AB (ref 14–54)
AST: 12 U/L — AB (ref 15–41)
Anion gap: 2 — ABNORMAL LOW (ref 5–15)
BILIRUBIN TOTAL: 0.6 mg/dL (ref 0.3–1.2)
BUN: <5 mg/dL — ABNORMAL LOW (ref 6–20)
CO2: 21 mmol/L — ABNORMAL LOW (ref 22–32)
Calcium: 8.2 mg/dL — ABNORMAL LOW (ref 8.9–10.3)
Chloride: 110 mmol/L (ref 101–111)
Creatinine, Ser: 0.53 mg/dL (ref 0.44–1.00)
GFR calc Af Amer: >60 mL/min (ref >60)
GFR calc non Af Amer: >60 mL/min (ref >60)
Glucose, Bld: 82 mg/dL (ref 65–99)
POTASSIUM: 3.8 mmol/L (ref 3.5–5.1)
Sodium: 133 mmol/L — ABNORMAL LOW (ref 135–145)
TOTAL PROTEIN: 6.1 g/dL — AB (ref 6.5–8.1)

## 2014-10-02 LAB — CBC
HEMATOCRIT: 27.9 % — AB (ref 36.0–46.0)
HEMOGLOBIN: 9.6 g/dL — AB (ref 12.0–15.0)
MCH: 30 pg (ref 26.0–34.0)
MCHC: 34.4 g/dL (ref 30.0–36.0)
MCV: 87.2 fL (ref 78.0–100.0)
Platelets: 260 10*3/uL (ref 150–400)
RBC: 3.2 MIL/uL — AB (ref 3.87–5.11)
RDW: 14.7 % (ref 11.5–15.5)
WBC: 6.8 10*3/uL (ref 4.0–10.5)

## 2014-10-02 LAB — PROTEIN / CREATININE RATIO, URINE
Creatinine, Urine: 34 mg/dL
Total Protein, Urine: <6 mg/dL

## 2014-10-02 LAB — TYPE AND SCREEN
ABO/RH(D): O POS
Antibody Screen: NEGATIVE

## 2014-10-02 LAB — RPR: RPR: NONREACTIVE

## 2014-10-02 MED ORDER — OXYCODONE-ACETAMINOPHEN 5-325 MG PO TABS: 1.0000 | ORAL_TABLET | ORAL | Status: DC | PRN

## 2014-10-02 MED ORDER — DIPHENHYDRAMINE HCL 50 MG/ML IJ SOLN: 12.5000 mg | INTRAMUSCULAR | Status: DC | PRN

## 2014-10-02 MED ORDER — ACETAMINOPHEN 325 MG PO TABS: 650.0000 mg | ORAL_TABLET | ORAL | Status: DC | PRN

## 2014-10-02 MED ORDER — DIBUCAINE 1 % RE OINT: 1.0000 "application " | TOPICAL_OINTMENT | RECTAL | Status: DC | PRN

## 2014-10-02 MED ORDER — LIDOCAINE HCL (PF) 1 % IJ SOLN
30.0000 mL | INTRAMUSCULAR | Status: DC | PRN
  Filled 2014-10-02: qty 30

## 2014-10-02 MED ORDER — FERROUS SULFATE 325 (65 FE) MG PO TABS
325.0000 mg | ORAL_TABLET | Freq: Two times a day (BID) | ORAL | Status: DC
  Administered 2014-10-03 – 2014-10-04 (×3): 325 mg via ORAL
  Filled 2014-10-02 (×3): qty 1

## 2014-10-02 MED ORDER — PRENATAL MULTIVITAMIN CH
1.0000 | ORAL_TABLET | Freq: Every day | ORAL | Status: DC
  Administered 2014-10-03 – 2014-10-04 (×2): 1 via ORAL
  Filled 2014-10-02 (×2): qty 1

## 2014-10-02 MED ORDER — SIMETHICONE 80 MG PO CHEW: 80.0000 mg | CHEWABLE_TABLET | ORAL | Status: DC | PRN

## 2014-10-02 MED ORDER — OXYCODONE-ACETAMINOPHEN 5-325 MG PO TABS: 2.0000 | ORAL_TABLET | ORAL | Status: DC | PRN

## 2014-10-02 MED ORDER — PHENYLEPHRINE 40 MCG/ML (10ML) SYRINGE FOR IV PUSH (FOR BLOOD PRESSURE SUPPORT)
80.0000 ug | PREFILLED_SYRINGE | INTRAVENOUS | Status: DC | PRN
  Filled 2014-10-02: qty 20
  Filled 2014-10-02: qty 2

## 2014-10-02 MED ORDER — SENNOSIDES-DOCUSATE SODIUM 8.6-50 MG PO TABS
2.0000 | ORAL_TABLET | ORAL | Status: DC
  Administered 2014-10-03: 2 via ORAL
  Filled 2014-10-02 (×2): qty 2

## 2014-10-02 MED ORDER — EPHEDRINE 5 MG/ML INJ
10.0000 mg | INTRAVENOUS | Status: DC | PRN
  Filled 2014-10-02: qty 2

## 2014-10-02 MED ORDER — OXYTOCIN 40 UNITS IN LACTATED RINGERS INFUSION - SIMPLE MED
1.0000 m[IU]/min | INTRAVENOUS | Status: DC
  Administered 2014-10-02: 2 m[IU]/min via INTRAVENOUS

## 2014-10-02 MED ORDER — ZOLPIDEM TARTRATE 5 MG PO TABS: 5.0000 mg | ORAL_TABLET | Freq: Every evening | ORAL | Status: DC | PRN

## 2014-10-02 MED ORDER — ONDANSETRON HCL 4 MG/2ML IJ SOLN: 4.0000 mg | INTRAMUSCULAR | Status: DC | PRN

## 2014-10-02 MED ORDER — ONDANSETRON HCL 4 MG PO TABS
4.0000 mg | ORAL_TABLET | ORAL | Status: DC | PRN
Start: 2014-10-02 — End: 2014-10-04

## 2014-10-02 MED ORDER — BENZOCAINE-MENTHOL 20-0.5 % EX AERO
1.0000 "application " | INHALATION_SPRAY | CUTANEOUS | Status: DC | PRN
  Administered 2014-10-03: 1 via TOPICAL
  Filled 2014-10-02: qty 56

## 2014-10-02 MED ORDER — IBUPROFEN 600 MG PO TABS
600.0000 mg | ORAL_TABLET | Freq: Four times a day (QID) | ORAL | Status: DC
  Administered 2014-10-03 – 2014-10-04 (×7): 600 mg via ORAL
  Filled 2014-10-02 (×7): qty 1

## 2014-10-02 MED ORDER — PENICILLIN G POTASSIUM 5000000 UNITS IJ SOLR
2.5000 10*6.[IU] | INTRAVENOUS | Status: DC
  Administered 2014-10-02 (×2): 2.5 10*6.[IU] via INTRAVENOUS
  Filled 2014-10-02 (×5): qty 2.5

## 2014-10-02 MED ORDER — WITCH HAZEL-GLYCERIN EX PADS: 1.0000 "application " | MEDICATED_PAD | CUTANEOUS | Status: DC | PRN

## 2014-10-02 MED ORDER — ONDANSETRON HCL 4 MG/2ML IJ SOLN: 4.0000 mg | Freq: Four times a day (QID) | INTRAMUSCULAR | Status: DC | PRN

## 2014-10-02 MED ORDER — LACTATED RINGERS IV SOLN
INTRAVENOUS | Status: DC
  Administered 2014-10-02 (×2): via INTRAVENOUS

## 2014-10-02 MED ORDER — CITRIC ACID-SODIUM CITRATE 334-500 MG/5ML PO SOLN: 30.0000 mL | ORAL | Status: DC | PRN

## 2014-10-02 MED ORDER — LANOLIN HYDROUS EX OINT: TOPICAL_OINTMENT | CUTANEOUS | Status: DC | PRN

## 2014-10-02 MED ORDER — OXYTOCIN BOLUS FROM INFUSION: 500.0000 mL | INTRAVENOUS | Status: DC

## 2014-10-02 MED ORDER — OXYTOCIN 40 UNITS IN LACTATED RINGERS INFUSION - SIMPLE MED
62.5000 mL/h | INTRAVENOUS | Status: DC
  Administered 2014-10-02: 62.5 mL/h via INTRAVENOUS
  Filled 2014-10-02: qty 1000

## 2014-10-02 MED ORDER — FENTANYL 2.5 MCG/ML BUPIVACAINE 1/10 % EPIDURAL INFUSION (WH - ANES)
14.0000 mL/h | INTRAMUSCULAR | Status: DC | PRN
  Administered 2014-10-02 (×2): 14 mL/h via EPIDURAL
  Filled 2014-10-02 (×2): qty 125

## 2014-10-02 MED ORDER — LACTATED RINGERS IV SOLN
500.0000 mL | INTRAVENOUS | Status: DC | PRN
  Administered 2014-10-02: 1000 mL via INTRAVENOUS

## 2014-10-02 MED ORDER — TERBUTALINE SULFATE 1 MG/ML IJ SOLN
0.2500 mg | Freq: Once | INTRAMUSCULAR | Status: DC | PRN
  Filled 2014-10-02: qty 1

## 2014-10-02 MED ORDER — TETANUS-DIPHTH-ACELL PERTUSSIS 5-2.5-18.5 LF-MCG/0.5 IM SUSP
0.5000 mL | Freq: Once | INTRAMUSCULAR | Status: AC
  Administered 2014-10-03: 0.5 mL via INTRAMUSCULAR

## 2014-10-02 MED ORDER — LIDOCAINE HCL (PF) 1 % IJ SOLN
INTRAMUSCULAR | Status: DC | PRN
  Administered 2014-10-02: 5 mL
  Administered 2014-10-02: 3 mL
  Administered 2014-10-02: 5 mL

## 2014-10-02 MED ORDER — FLEET ENEMA 7-19 GM/118ML RE ENEM: 1.0000 | ENEMA | Freq: Every day | RECTAL | Status: DC | PRN

## 2014-10-02 MED ORDER — DIPHENHYDRAMINE HCL 25 MG PO CAPS: 25.0000 mg | ORAL_CAPSULE | Freq: Four times a day (QID) | ORAL | Status: DC | PRN

## 2014-10-02 MED ORDER — PENICILLIN G POTASSIUM 5000000 UNITS IJ SOLR
5.0000 10*6.[IU] | Freq: Once | INTRAVENOUS | Status: AC
  Administered 2014-10-02: 5 10*6.[IU] via INTRAVENOUS
  Filled 2014-10-02: qty 5

## 2014-10-02 MED ORDER — BUTORPHANOL TARTRATE 1 MG/ML IJ SOLN
1.0000 mg | INTRAMUSCULAR | Status: DC | PRN
  Administered 2014-10-02: 1 mg via INTRAVENOUS
  Filled 2014-10-02: qty 1

## 2014-10-02 NOTE — Anesthesia Preprocedure Evaluation (Signed)
Anesthesia Evaluation  Patient identified by MRN, date of birth, ID band Patient awake    Reviewed: Allergy & Precautions, H&P , NPO status , Patient's Chart, lab work & pertinent test results  History of Anesthesia Complications Negative for: history of anesthetic complications  Airway Mallampati: II  TM Distance: >3 FB Neck ROM: full    Dental no notable dental hx. (+) Teeth Intact   Pulmonary neg pulmonary ROS, Current Smoker,  breath sounds clear to auscultation  Pulmonary exam normal       Cardiovascular negative cardio ROS Normal cardiovascular examRhythm:regular Rate:Normal     Neuro/Psych MS negative neurological ROS  negative psych ROS   GI/Hepatic negative GI ROS, Neg liver ROS,   Endo/Other  negative endocrine ROS  Renal/GU negative Renal ROS  negative genitourinary   Musculoskeletal   Abdominal   Peds  Hematology negative hematology ROS (+)   Anesthesia Other Findings   Reproductive/Obstetrics (+) Pregnancy                             Anesthesia Physical Anesthesia Plan  ASA: II  Anesthesia Plan: Epidural   Post-op Pain Management:    Induction:   Airway Management Planned:   Additional Equipment:   Intra-op Plan:   Post-operative Plan:   Informed Consent: I have reviewed the patients History and Physical, chart, labs and discussed the procedure including the risks, benefits and alternatives for the proposed anesthesia with the patient or authorized representative who has indicated his/her understanding and acceptance.     Plan Discussed with:   Anesthesia Plan Comments:         Anesthesia Quick Evaluation

## 2014-10-02 NOTE — H&P (Signed)
This is Dr. Francoise Ceo dictating the history and physical on  Cheryl Bright she's a 29 year old gravida 4 para 2012 at 40 weeks and 2 days EDC 09/30/2014 positive GBS she was admitted for induction cervix is 3 cm 80% vertex -2 amniotomy performed fluids clear and IUPC was inserted and she is on low-dose Pitocin she also received penicillin for her GBS patient has a history of early multiple sclerosis and she is not on any medication and she is asymptomatic Past medical history multiple sclerosis early diagnosis no minutes Past surgical history she had a C-section 1 for breech presentation Social history denies smoking drinking and drug use Family history negative System review noncontributory Physical exam well-developed female in early labor HEENT negative Lungs clear to P&A Heart regular rhythm no murmurs no gallops 's breasts negative Abdomen term Pelvic as described above Extremities negative

## 2014-10-02 NOTE — Anesthesia Procedure Notes (Signed)
Epidural Patient location during procedure: OB  Staffing Anesthesiologist: Phillips Grout Performed by: anesthesiologist   Preanesthetic Checklist Completed: patient identified, site marked, surgical consent, pre-op evaluation, timeout performed, IV checked, risks and benefits discussed and monitors and equipment checked  Epidural Patient position: sitting Prep: DuraPrep Patient monitoring: heart rate, continuous pulse ox and blood pressure Approach: right paramedian Location: L2-L3 Injection technique: LOR saline  Needle:  Needle type: Tuohy  Needle gauge: 17 G Needle length: 9 cm and 9 Needle insertion depth: 6 cm Catheter type: closed end flexible Catheter size: 20 Guage Catheter at skin depth: 11 cm Test dose: negative  Assessment Events: blood not aspirated, injection not painful, no injection resistance, negative IV test and no paresthesia  Additional Notes Patient identified. Risks/Benefits/Options discussed with patient including but not limited to bleeding, infection, nerve damage, paralysis, failed block, incomplete pain control, headache, blood pressure changes, nausea, vomiting, reactions to medication both or allergic, itching and postpartum back pain. Confirmed with bedside nurse the patient's most recent platelet count. Confirmed with patient that they are not currently taking any anticoagulation, have any bleeding history or any family history of bleeding disorders. Patient expressed understanding and wished to proceed. All questions were answered. Sterile technique was used throughout the entire procedure. Please see nursing notes for vital signs. Test dose was given through epidural needle and negative prior to continuing to dose epidural or start infusion. Warning signs of high block given to the patient including shortness of breath, tingling/numbness in hands, complete motor block, or any concerning symptoms with instructions to call for help. Patient was given  instructions on fall risk and not to get out of bed. All questions and concerns addressed with instructions to call with any issues.

## 2014-10-03 LAB — CBC
HCT: 25.6 % — ABNORMAL LOW (ref 36.0–46.0)
Hemoglobin: 9 g/dL — ABNORMAL LOW (ref 12.0–15.0)
MCH: 30.6 pg (ref 26.0–34.0)
MCHC: 35.2 g/dL (ref 30.0–36.0)
MCV: 87.1 fL (ref 78.0–100.0)
PLATELETS: 234 10*3/uL (ref 150–400)
RBC: 2.94 MIL/uL — AB (ref 3.87–5.11)
RDW: 14.7 % (ref 11.5–15.5)
WBC: 13.7 10*3/uL — ABNORMAL HIGH (ref 4.0–10.5)

## 2014-10-03 NOTE — Lactation Note (Signed)
This note was copied from the chart of Cheryl Bright. Lactation Consultation Note  Patient Name: Cheryl Bright [id number] Date: 10/03/2014 Reason for consult: Initial assessment   Visited with Mom, baby 27 hrs old.  Baby did go to breast after delivery, but has since had 6 bottles of formula.  History of diagnosis of MS (no meds), PCOS, tobacco use, and depression. Talked with Mom about whether she would like to breast feed, and explained that there was breastfeeding assistance available inpatient and outpatient.  Discussed importance of exclusive breast feeding, with explanation on why.  Encouraged skin to skin, and feeding often on cue.  Talked about manual expression to help stimulate her colostrum flow.  Mom declined assistance presently, but may call later.  When asked if she would like a visit from [facility] tomorrow, she said yes. Brochure left with Mom, and information about resources in the community.  Encouraged her to call prn for help.  Follow up in am.                Consult Status Consult Status: Follow-up Date: 10/04/14 Follow-up type: In-patient    Cheryl Bright 10/03/2014, 1:14 PM

## 2014-10-03 NOTE — Anesthesia Postprocedure Evaluation (Signed)
  Anesthesia Post-op Note  Patient: Cheryl Bright  Procedure(s) Performed: * No procedures listed *  Patient Location: PACU and Mother/Baby  Anesthesia Type:Epidural  Level of Consciousness: awake, alert  and oriented  Airway and Oxygen Therapy: Patient Spontanous Breathing  Post-op Pain: none  Post-op Assessment: Post-op Vital signs reviewed, Patient's Cardiovascular Status Stable, No headache, No backache, No residual numbness and No residual motor weakness  Post-op Vital Signs: Reviewed and stable  Complications: No apparent anesthesia complications

## 2014-10-03 NOTE — Progress Notes (Signed)
Patient ID: Cheryl Bright, female   DOB: 1985-03-29, 29 y.o.   MRN: [id number] Postpartum day one blood pressure 122/72 respiration 20 pulse 76 afebrile Fundus firm Lochia moderate Legs negative doing well

## 2014-10-04 NOTE — Discharge Summary (Signed)
Obstetric Discharge Summary Reason for Admission: induction of labor Prenatal Procedures: none Intrapartum Procedures: spontaneous vaginal delivery Postpartum Procedures: none Complications-Operative and Postpartum: none HEMOGLOBIN  Date Value Ref Range Status  10/03/2014 9.0* 12.0 - 15.0 g/dL Final   HCT  Date Value Ref Range Status  10/03/2014 25.6* 36.0 - 46.0 % Final    Physical Exam:  General: alert Lochia: appropriate Uterine Fundus: firm Incision: healing well DVT Evaluation: No evidence of DVT seen on physical exam.  Discharge Diagnoses: Term Pregnancy-delivered  Discharge Information: Date: 10/04/2014 Activity: pelvic rest Diet: routine Medications: Percocet Condition: improved Instructions: refer to practice specific booklet Discharge to: home Follow-up Information    Follow up with MARSHALL,BERNARD A, MD In 6 weeks.   Specialty:  Obstetrics and Gynecology   Contact information:   [address] [place] [address] [phone]       Newborn Data: Live born female  Birth Weight: 6 lb 8.8 oz (2970 g) APGAR: 9, 9  Home with mother.  MARSHALL,BERNARD A 10/04/2014, 6:44 AM

## 2014-10-04 NOTE — Discharge Instructions (Signed)
Discharge instructions   You can wash your hair  Shower  Eat what you want  Drink what you want  See me in 6 weeks  Your ankles are going to swell more in the next 2 weeks than when pregnant  No sex for 6 weeks   Keith Cancio A, MD 10/04/2014

## 2014-10-04 NOTE — Progress Notes (Signed)
UR chart review completed.  

## 2014-10-04 NOTE — Progress Notes (Signed)
Patient states that she breastfed the infant once during the night and that he latched without a problem.  Infant showing feeding cues. Patient is refusing to attempt to latch the infant at this time.

## 2014-10-04 NOTE — Lactation Note (Signed)
This note was copied from the chart of [name]. Lactation Consultation Note  Patient Name: [name] Date: 10/04/2014 Reason for consult: Follow-up assessment   With this mom and term baby, now 42 hours old. Mom states she has breast fed her other children. Mom has PCOS and MS. She has not been breastfeeding, only formula feeding. I gave her 4 60 mls bottle of similac 19 to take home, and reviewed formula care with mom. I also gave mom a hand pump, and instructed her in it's use, and showed mom how to hand express. I advised mom to pump at least 8 times day , if she want s to provide EBM for her baby. Mom has a [facility] appointment to get formula in 2 weeks, so is going to have to buy some formula prior to this .    Maternal Data    Feeding    LATCH Score/Interventions                      Lactation Tools Discussed/Used     Consult Status Consult Status: Complete Follow-up type: Call as needed    Lyndle Pang G 10/04/2014, 11:43 AM

## 2015-02-19 ENCOUNTER — Emergency Department (HOSPITAL_COMMUNITY)
Admission: EM | Admit: 2015-02-19 | Discharge: 2015-02-19 | Discharge status: Against Medical Advice | Payer: Medicaid Other | Attending: Emergency Medicine | Admitting: Emergency Medicine

## 2015-02-19 ENCOUNTER — Encounter (HOSPITAL_COMMUNITY): Payer: Self-pay | Admitting: *Deleted

## 2015-02-19 DIAGNOSIS — H469 Unspecified optic neuritis: Secondary | ICD-10-CM

## 2015-02-19 DIAGNOSIS — Z8659 Personal history of other mental and behavioral disorders: Secondary | ICD-10-CM | POA: Insufficient documentation

## 2015-02-19 DIAGNOSIS — F17210 Nicotine dependence, cigarettes, uncomplicated: Secondary | ICD-10-CM | POA: Diagnosis not present

## 2015-02-19 DIAGNOSIS — Z91040 Latex allergy status: Secondary | ICD-10-CM | POA: Diagnosis not present

## 2015-02-19 DIAGNOSIS — Z3490 Encounter for supervision of normal pregnancy, unspecified, unspecified trimester: Secondary | ICD-10-CM

## 2015-02-19 DIAGNOSIS — H578 Other specified disorders of eye and adnexa: Secondary | ICD-10-CM | POA: Diagnosis present

## 2015-02-19 DIAGNOSIS — Z331 Pregnant state, incidental: Secondary | ICD-10-CM | POA: Insufficient documentation

## 2015-02-19 LAB — CBC WITH DIFFERENTIAL/PLATELET
Basophils Absolute: 0 10*3/uL (ref 0.0–0.1)
Basophils Relative: 0 %
EOS PCT: 2 %
Eosinophils Absolute: 0.2 10*3/uL (ref 0.0–0.7)
HCT: 35.8 % — ABNORMAL LOW (ref 36.0–46.0)
Hemoglobin: 12.2 g/dL (ref 12.0–15.0)
LYMPHS ABS: 2.6 10*3/uL (ref 0.7–4.0)
LYMPHS PCT: 31 %
MCH: 29.2 pg (ref 26.0–34.0)
MCHC: 34.1 g/dL (ref 30.0–36.0)
MCV: 85.6 fL (ref 78.0–100.0)
MONOS PCT: 7 %
Monocytes Absolute: 0.5 10*3/uL (ref 0.1–1.0)
Neutro Abs: 4.8 10*3/uL (ref 1.7–7.7)
Neutrophils Relative %: 60 %
Platelets: 317 10*3/uL (ref 150–400)
RBC: 4.18 MIL/uL (ref 3.87–5.11)
RDW: 13.4 % (ref 11.5–15.5)
WBC: 8.1 10*3/uL (ref 4.0–10.5)

## 2015-02-19 LAB — BASIC METABOLIC PANEL
Anion gap: 6 (ref 5–15)
BUN: 6 mg/dL (ref 6–20)
CHLORIDE: 104 mmol/L (ref 101–111)
CO2: 22 mmol/L (ref 22–32)
CREATININE: 0.5 mg/dL (ref 0.44–1.00)
Calcium: 9.1 mg/dL (ref 8.9–10.3)
GFR calc Af Amer: >60 mL/min (ref >60)
GFR calc non Af Amer: >60 mL/min (ref >60)
GLUCOSE: 81 mg/dL (ref 65–99)
Potassium: 3.8 mmol/L (ref 3.5–5.1)
Sodium: 132 mmol/L — ABNORMAL LOW (ref 135–145)

## 2015-02-19 LAB — POC URINE PREG, ED: Preg Test, Ur: POSITIVE — AB

## 2015-02-19 NOTE — ED Provider Notes (Signed)
CSN: [id number]     Arrival date & time 02/19/15  4098 History   First MD Initiated Contact with Patient 02/19/15 [phone]     Chief Complaint  Patient presents with  . Eye Problem     (Consider location/radiation/quality/duration/timing/severity/associated sxs/prior Treatment) HPI Comments: Patient with history of multiple sclerosis presents with complaint of acute onset, gradually progressive left eye blurriness. No history of any ocular problems due to her MS. She is having difficulty resolving any details with her left eye. She denies any injuries or foreign bodies into the eye. No fevers, URI symptoms. No significant headache or photophobia. Seen by an optometrist yesterday he recommended ED evaluation. No retinal issues or optic disc swelling per their exam. No treatments prior to arrival. Patient otherwise denies numbness, tingling, or weakness in extremities. She has had past numbness and ataxia from her MS. She currently denies having a neurologist. Nothing makes symptoms better or worse.  Patient is a 29 y.o. female presenting with eye problem. The history is provided by the patient.  Eye Problem Associated symptoms: no discharge, no headaches, no nausea, no numbness, no photophobia, no redness, no vomiting and no weakness     Past Medical History  Diagnosis Date  . No pertinent past medical history   . Depression   . MS (multiple sclerosis)    Past Surgical History  Procedure Laterality Date  . Foot surgery    . Cesarean section     Family History  Problem Relation Age of Onset  . Multiple sclerosis Sister    Social History  Substance Use Topics  . Smoking status: Current Every Day Smoker -- 0.50 packs/day for 5 years    Types: Cigarettes  . Smokeless tobacco: Never Used  . Alcohol Use: 0.0 oz/week    0 Standard drinks or equivalent per week     Comment: several times per month   OB History    Gravida Para Term Preterm AB TAB SAB Ectopic Multiple Living   0 3     Review of Systems  Constitutional: Negative for fever.  HENT: Negative for congestion, dental problem, rhinorrhea and sinus pressure.   Eyes: Positive for visual disturbance. Negative for photophobia, discharge and redness.  Respiratory: Negative for shortness of breath.   Cardiovascular: Negative for chest pain.  Gastrointestinal: Negative for nausea and vomiting.  Musculoskeletal: Negative for gait problem, neck pain and neck stiffness.  Skin: Negative for rash.  Neurological: Negative for syncope, speech difficulty, weakness, light-headedness, numbness and headaches.  Psychiatric/Behavioral: Negative for confusion.      Allergies  Sulfa drugs cross reactors and Latex  Home Medications   Prior to Admission medications   Not on File   BP 124/85 mmHg  Pulse 85  Temp(Src) 98.5 F (36.9 C) (Oral)  Resp 16  Ht  (1.575 m)  Wt 80.287 kg  BMI 32.37 kg/m2  SpO2 99%  LMP 01/20/2015 Physical Exam  Constitutional: She is oriented to person, place, and time. She appears well-developed and well-nourished.  HENT:  Head: Normocephalic and atraumatic.  Right Ear: Tympanic membrane, external ear and ear canal normal.  Left Ear: Tympanic membrane, external ear and ear canal normal.  Nose: Nose normal.  Mouth/Throat: Uvula is midline, oropharynx is clear and moist and mucous membranes are normal.  Eyes: Conjunctivae, EOM and lids are normal. Right eye exhibits no discharge. Left eye exhibits no discharge. Right eye exhibits no nystagmus. Left eye exhibits no  nystagmus. Right pupil is round and reactive. Left pupil is round.    Neck: Normal range of motion. Neck supple.  Cardiovascular: Normal rate, regular rhythm and normal heart sounds.   Pulmonary/Chest: Effort normal and breath sounds normal.  Abdominal: Soft. There is no tenderness.  Musculoskeletal:       Cervical back: She exhibits normal range of motion, no tenderness and no bony tenderness.  Neurological:  She is alert and oriented to person, place, and time. She has normal strength and normal reflexes. No cranial nerve deficit or sensory deficit. She displays a negative Romberg sign. Coordination and gait normal. GCS eye subscore is 4. GCS verbal subscore is 5. GCS motor subscore is 6.  Skin: Skin is warm and dry.  Psychiatric: She has a normal mood and affect.  Nursing note and vitals reviewed.   ED Course  Procedures (including critical care time) Labs Review Labs Reviewed  CBC WITH DIFFERENTIAL/PLATELET - Abnormal; Notable for the following:    HCT 35.8 (*)    All other components within normal limits  BASIC METABOLIC PANEL - Abnormal; Notable for the following:    Sodium 132 (*)    All other components within normal limits  POC URINE PREG, ED - Abnormal; Notable for the following:    Preg Test, Ur POSITIVE (*)    All other components within normal limits    Imaging Review No results found. I have personally reviewed and evaluated these images and lab results as part of my medical decision-making.   EKG Interpretation None       9:03 AM Patient seen and examined. Work-up initiated.    Vital signs reviewed and are as follows: BP 126/74 mmHg  Pulse 81  Temp(Src) 98.5 F (36.9 C) (Oral)  Resp 18  Ht  (1.575 m)  Wt 80.287 kg  BMI 32.37 kg/m2  SpO2 100%  LMP 01/20/2015  Suspect optic neuritis. Will consult neurology. Discussed with Dr. Patria Mane. Dr. Patria Mane saw Dr. Leroy Kennedy here who was evaluating another patient and informed them of patient in ED requiring consultation.   12:07 PM called to patient's room by nurse. Patient is stating that she does not want to stay in the hospital. She has not yet been seen by the neurologist. I stated that likely recommendation will be for additional hospital for IV antibiotics. Patient informed that this will hopefully help her visual symptoms improve more rapidly. I explained that this likely gives her the best chance to recover her  vision. She states "I'm not staying in the hospital, that's not going to happen". Mother at bedside. I encouraged her to at least stay to discuss her symptoms with a neurologist.  Several minutes later the nurse returns stating that the patient is dressed, has removed her IV, and wants to go home. Will discharge. AGAINST MEDICAL ADVICE. The patient invited to return if she like further evaluation and workup. She was informed that she may have permanent vision damage if her current condition is not worked up.   Incidental pregnancy noted. Patient was suspicious of this.   MDM   Final diagnoses:  Optic neuritis  Pregnancy   Patient with probable optic neuritis related to multiple sclerosis. Patient does not want to stay and is leaving AGAINST MEDICAL ADVICE.    Renne Crigler, PA-C 02/19/15 1211  Azalia Bilis, MD 02/19/15 [phone]

## 2015-02-19 NOTE — ED Notes (Addendum)
Pt offered food but stated her mother will get her some food.

## 2015-02-19 NOTE — ED Notes (Signed)
Pt states hx of MS and new onset of left vision change to blurry x 3 days.  Pt denies any other medical problems.

## 2015-02-19 NOTE — ED Notes (Signed)
Pt ripped out IV and got dressed stating she didn't want to stay.  Pt refused to get final vital signs.  Pt moms stated she was just having one of her fits.  RN signed her out AMA and walked her to the door.

## 2015-02-23 ENCOUNTER — Ambulatory Visit (HOSPITAL_COMMUNITY)
Admission: RE | Admit: 2015-02-23 | Discharge: 2015-02-23 | Disposition: A | Discharge status: Home / Self Care | Payer: Medicaid Other | Source: Ambulatory Visit | Attending: Psychiatry | Admitting: Psychiatry

## 2015-02-23 DIAGNOSIS — G35 Multiple sclerosis: Secondary | ICD-10-CM | POA: Insufficient documentation

## 2015-02-23 MED ORDER — SODIUM CHLORIDE 0.9 % IV SOLN: 250.0000 mg | Freq: Every day | INTRAVENOUS | Status: DC

## 2015-02-23 MED ORDER — SODIUM CHLORIDE 0.9 % IV SOLN
250.0000 mg | Freq: Once | INTRAVENOUS | Status: AC
  Administered 2015-02-23: 250 mg via INTRAVENOUS
  Filled 2015-02-23: qty 2

## 2015-02-23 MED ORDER — METHYLPREDNISOLONE SODIUM SUCC 125 MG IJ SOLR: 250.0000 mg | Freq: Every day | INTRAMUSCULAR | Status: DC

## 2015-02-24 ENCOUNTER — Ambulatory Visit (HOSPITAL_COMMUNITY)
Admission: RE | Admit: 2015-02-24 | Discharge: 2015-02-24 | Disposition: A | Discharge status: Home / Self Care | Payer: Medicaid Other | Source: Ambulatory Visit | Attending: Psychiatry | Admitting: Psychiatry

## 2015-02-24 DIAGNOSIS — G35 Multiple sclerosis: Secondary | ICD-10-CM | POA: Insufficient documentation

## 2015-02-24 MED ORDER — METHYLPREDNISOLONE SODIUM SUCC 125 MG IJ SOLR: 250.0000 mg | Freq: Every day | INTRAMUSCULAR | Status: DC

## 2015-02-24 MED ORDER — SODIUM CHLORIDE 0.9 % IV SOLN
250.0000 mg | Freq: Once | INTRAVENOUS | Status: AC
  Administered 2015-02-24: 250 mg via INTRAVENOUS
  Filled 2015-02-24: qty 2

## 2015-02-25 ENCOUNTER — Ambulatory Visit (HOSPITAL_COMMUNITY)
Admission: RE | Admit: 2015-02-25 | Discharge: 2015-02-25 | Disposition: A | Discharge status: Home / Self Care | Payer: Medicaid Other | Source: Ambulatory Visit | Attending: Psychiatry | Admitting: Psychiatry

## 2015-02-25 DIAGNOSIS — G35 Multiple sclerosis: Secondary | ICD-10-CM | POA: Diagnosis not present

## 2015-02-25 MED ORDER — SODIUM CHLORIDE 0.9 % IV SOLN
250.0000 mg | Freq: Once | INTRAVENOUS | Status: AC
  Administered 2015-02-25: 250 mg via INTRAVENOUS
  Filled 2015-02-25: qty 2

## 2015-03-01 ENCOUNTER — Inpatient Hospital Stay (HOSPITAL_COMMUNITY)
Admission: AD | Admit: 2015-03-01 | Discharge: 2015-03-01 | Disposition: A | Discharge status: Home / Self Care | Payer: Medicaid Other | Source: Ambulatory Visit | Attending: Family Medicine | Admitting: Family Medicine

## 2015-03-01 ENCOUNTER — Encounter (HOSPITAL_COMMUNITY): Payer: Self-pay | Admitting: *Deleted

## 2015-03-01 ENCOUNTER — Inpatient Hospital Stay (HOSPITAL_COMMUNITY): Payer: Medicaid Other

## 2015-03-01 DIAGNOSIS — F329 Major depressive disorder, single episode, unspecified: Secondary | ICD-10-CM | POA: Diagnosis not present

## 2015-03-01 DIAGNOSIS — R109 Unspecified abdominal pain: Secondary | ICD-10-CM

## 2015-03-01 DIAGNOSIS — G35 Multiple sclerosis: Secondary | ICD-10-CM | POA: Diagnosis not present

## 2015-03-01 DIAGNOSIS — Z3A12 12 weeks gestation of pregnancy: Secondary | ICD-10-CM | POA: Insufficient documentation

## 2015-03-01 DIAGNOSIS — Z882 Allergy status to sulfonamides status: Secondary | ICD-10-CM | POA: Diagnosis not present

## 2015-03-01 DIAGNOSIS — O4691 Antepartum hemorrhage, unspecified, first trimester: Secondary | ICD-10-CM

## 2015-03-01 DIAGNOSIS — O23591 Infection of other part of genital tract in pregnancy, first trimester: Secondary | ICD-10-CM

## 2015-03-01 DIAGNOSIS — F17210 Nicotine dependence, cigarettes, uncomplicated: Secondary | ICD-10-CM | POA: Diagnosis not present

## 2015-03-01 DIAGNOSIS — N760 Acute vaginitis: Secondary | ICD-10-CM | POA: Diagnosis not present

## 2015-03-01 DIAGNOSIS — A499 Bacterial infection, unspecified: Secondary | ICD-10-CM | POA: Diagnosis not present

## 2015-03-01 DIAGNOSIS — O9989 Other specified diseases and conditions complicating pregnancy, childbirth and the puerperium: Secondary | ICD-10-CM

## 2015-03-01 DIAGNOSIS — Z91040 Latex allergy status: Secondary | ICD-10-CM | POA: Insufficient documentation

## 2015-03-01 DIAGNOSIS — O26899 Other specified pregnancy related conditions, unspecified trimester: Secondary | ICD-10-CM

## 2015-03-01 DIAGNOSIS — O209 Hemorrhage in early pregnancy, unspecified: Secondary | ICD-10-CM

## 2015-03-01 DIAGNOSIS — O26851 Spotting complicating pregnancy, first trimester: Secondary | ICD-10-CM | POA: Diagnosis not present

## 2015-03-01 DIAGNOSIS — B9689 Other specified bacterial agents as the cause of diseases classified elsewhere: Secondary | ICD-10-CM

## 2015-03-01 LAB — URINE MICROSCOPIC-ADD ON: RBC / HPF: NONE SEEN RBC/hpf (ref 0–5)

## 2015-03-01 LAB — CBC
HCT: 30.9 % — ABNORMAL LOW (ref 36.0–46.0)
Hemoglobin: 10.6 g/dL — ABNORMAL LOW (ref 12.0–15.0)
MCH: 29 pg (ref 26.0–34.0)
MCHC: 34.3 g/dL (ref 30.0–36.0)
MCV: 84.4 fL (ref 78.0–100.0)
Platelets: 311 10*3/uL (ref 150–400)
RBC: 3.66 MIL/uL — AB (ref 3.87–5.11)
RDW: 14 % (ref 11.5–15.5)
WBC: 13.1 10*3/uL — ABNORMAL HIGH (ref 4.0–10.5)

## 2015-03-01 LAB — WET PREP, GENITAL
SPERM: NONE SEEN
TRICH WET PREP: NONE SEEN
Yeast Wet Prep HPF POC: NONE SEEN

## 2015-03-01 LAB — HCG, QUANTITATIVE, PREGNANCY: hCG, Beta Chain, Quant, S: 61886 m[IU]/mL — ABNORMAL HIGH (ref <5)

## 2015-03-01 LAB — URINALYSIS, ROUTINE W REFLEX MICROSCOPIC
Bilirubin Urine: NEGATIVE
GLUCOSE, UA: NEGATIVE mg/dL
Ketones, ur: NEGATIVE mg/dL
NITRITE: NEGATIVE
PROTEIN: NEGATIVE mg/dL
Specific Gravity, Urine: <1.005 — ABNORMAL LOW (ref 1.005–1.030)
pH: 6 (ref 5.0–8.0)

## 2015-03-01 MED ORDER — METRONIDAZOLE 500 MG PO TABS: 500.0000 mg | ORAL_TABLET | Freq: Two times a day (BID) | ORAL | Status: DC

## 2015-03-01 NOTE — Discharge Instructions (Signed)

## 2015-03-01 NOTE — MAU Provider Note (Signed)
  History     CSN: [id number]  Arrival date and time: 03/01/15 1202   First Provider Initiated Contact with Patient 03/01/15 1249      No chief complaint on file.  HPI Cheryl Bright is a 29yo [id number] who presents for eval of spotting a couple of days ago (none today). Denies pain; no abnl vag d/c. Blood type O+.  OB History    Gravida Para Term Preterm AB TAB SAB Ectopic Multiple Living   5 3 3  1  1   0 3      Past Medical History  Diagnosis Date  . No pertinent past medical history   . Depression   . Cheryl (multiple sclerosis) [facility]     Past Surgical History  Procedure Laterality Date  . Foot surgery    . Cesarean section      Family History  Problem Relation Age of Onset  . Multiple sclerosis Sister     Social History  Substance Use Topics  . Smoking status: Current Every Day Smoker -- 0.50 packs/day for 5 years    Types: Cigarettes  . Smokeless tobacco: Never Used  . Alcohol Use: 0.0 oz/week    0 Standard drinks or equivalent per week     Comment: several times per month    Allergies:  Allergies  Allergen Reactions  . Sulfa Drugs Cross Reactors Hives and Swelling  . Latex Rash    No prescriptions prior to admission    ROS Physical Exam   Blood pressure 142/86, pulse 91, temperature 98.6 F (37 C), temperature source Oral, resp. rate 20, last menstrual period 01/20/2015, unknown if currently breastfeeding.  Physical Exam  Constitutional: She is oriented to person, place, and time. She appears well-developed.  HENT:  Head: Normocephalic.  Neck: Normal range of motion.  Cardiovascular: Normal rate.   Respiratory: Effort normal.  GI:  Uterus gravid  Genitourinary: Vagina normal.  SE: no blood noted; sm white vag d/c; cx C/L  Musculoskeletal: Normal range of motion.  Neurological: She is alert and oriented to person, place, and time.  Skin: Skin is warm and dry.  Psychiatric: She has a normal mood and affect. Her behavior is normal. Thought content  normal.   Urinalysis    Component Value Date/Time   COLORURINE STRAW* 03/01/2015 1220   APPEARANCEUR CLEAR 03/01/2015 1220   LABSPEC <1.005* 03/01/2015 1220   PHURINE 6.0 03/01/2015 1220   GLUCOSEU NEGATIVE 03/01/2015 1220   HGBUR TRACE* 03/01/2015 1220   BILIRUBINUR NEGATIVE 03/01/2015 1220   KETONESUR NEGATIVE 03/01/2015 1220   PROTEINUR NEGATIVE 03/01/2015 1220   UROBILINOGEN 0.2 08/09/2012 1150   NITRITE NEGATIVE 03/01/2015 1220   LEUKOCYTESUR MODERATE* 03/01/2015 1220   Micro: 0-5 SE, few bacteria  U/S: [redacted]w[redacted]d, [facility] 09/13/15  Wet prep: clue cells present, many WBC, bacteria TNTC  MAU Course  Procedures  MDM UA, quant, CBC, wet prep, GC/chlam  Assessment and Plan  IUP@12 .0wks BV Previous spotting- resolved  D/C home Plans to seek prenatal care with Dr Gaynell Face Rx Flagyl 500 BID to pharmacy GC/chlam pending  Cam Hai CNM 03/01/2015, 2:58 PM

## 2015-03-01 NOTE — MAU Note (Addendum)
Had been spotting a couple days ago,and is having a little pain, wanting to know how far along she is. + preg test at [facility] last wk

## 2015-03-02 LAB — HIV ANTIBODY (ROUTINE TESTING W REFLEX): HIV Screen 4th Generation wRfx: NONREACTIVE

## 2015-03-05 LAB — GC/CHLAMYDIA PROBE AMP (~~LOC~~) NOT AT ARMC
CHLAMYDIA, DNA PROBE: NEGATIVE
Neisseria Gonorrhea: NEGATIVE

## 2015-03-06 NOTE — L&D Delivery Note (Signed)
Delivery Note At 6:59 AM a viable female was delivered via Vaginal, Spontaneous Delivery (Presentation: ;  ).  APGAR: , ; weight  .   Placenta status: , .  Cord:  with the following complications: .  Cord pH: not done  Anesthesia: Epidural  Episiotomy: None Lacerations: None Suture Repair: 2.0 Est. Blood Loss (mL): 200  Mom to postpartum.  Baby to Couplet care / Skin to Skin.  Cheryl Bright A 08/30/2015, 7:10 AM

## 2015-08-30 ENCOUNTER — Encounter (HOSPITAL_COMMUNITY): Payer: Self-pay | Admitting: *Deleted

## 2015-08-30 ENCOUNTER — Inpatient Hospital Stay (HOSPITAL_COMMUNITY): Payer: Medicaid Other | Admitting: Anesthesiology

## 2015-08-30 ENCOUNTER — Inpatient Hospital Stay (HOSPITAL_COMMUNITY)
Admission: AD | Admit: 2015-08-30 | Discharge: 2015-09-01 | DRG: 775 | Disposition: A | Discharge status: Home / Self Care | Payer: Medicaid Other | Source: Ambulatory Visit | Attending: Obstetrics | Admitting: Obstetrics

## 2015-08-30 DIAGNOSIS — O99334 Smoking (tobacco) complicating childbirth: Secondary | ICD-10-CM | POA: Diagnosis present

## 2015-08-30 DIAGNOSIS — F1290 Cannabis use, unspecified, uncomplicated: Secondary | ICD-10-CM | POA: Diagnosis present

## 2015-08-30 DIAGNOSIS — G35 Multiple sclerosis: Secondary | ICD-10-CM | POA: Diagnosis present

## 2015-08-30 DIAGNOSIS — O4202 Full-term premature rupture of membranes, onset of labor within 24 hours of rupture: Principal | ICD-10-CM | POA: Diagnosis present

## 2015-08-30 DIAGNOSIS — Z3A38 38 weeks gestation of pregnancy: Secondary | ICD-10-CM | POA: Diagnosis not present

## 2015-08-30 DIAGNOSIS — Z820 Family history of epilepsy and other diseases of the nervous system: Secondary | ICD-10-CM | POA: Diagnosis not present

## 2015-08-30 DIAGNOSIS — IMO0001 Reserved for inherently not codable concepts without codable children: Secondary | ICD-10-CM

## 2015-08-30 DIAGNOSIS — O99324 Drug use complicating childbirth: Secondary | ICD-10-CM | POA: Diagnosis present

## 2015-08-30 DIAGNOSIS — O99354 Diseases of the nervous system complicating childbirth: Secondary | ICD-10-CM | POA: Diagnosis present

## 2015-08-30 DIAGNOSIS — F17210 Nicotine dependence, cigarettes, uncomplicated: Secondary | ICD-10-CM | POA: Diagnosis present

## 2015-08-30 LAB — RPR: RPR Ser Ql: NONREACTIVE

## 2015-08-30 LAB — CBC
HCT: 27.7 % — ABNORMAL LOW (ref 36.0–46.0)
Hemoglobin: 9.8 g/dL — ABNORMAL LOW (ref 12.0–15.0)
MCH: 30.2 pg (ref 26.0–34.0)
MCHC: 35.4 g/dL (ref 30.0–36.0)
MCV: 85.5 fL (ref 78.0–100.0)
Platelets: 329 10*3/uL (ref 150–400)
RBC: 3.24 MIL/uL — ABNORMAL LOW (ref 3.87–5.11)
RDW: 13.9 % (ref 11.5–15.5)
WBC: 8.5 10*3/uL (ref 4.0–10.5)

## 2015-08-30 LAB — TYPE AND SCREEN
ABO/RH(D): O POS
ANTIBODY SCREEN: NEGATIVE

## 2015-08-30 MED ORDER — LACTATED RINGERS IV SOLN: 500.0000 mL | INTRAVENOUS | Status: DC | PRN

## 2015-08-30 MED ORDER — PHENYLEPHRINE 40 MCG/ML (10ML) SYRINGE FOR IV PUSH (FOR BLOOD PRESSURE SUPPORT)
80.0000 ug | PREFILLED_SYRINGE | INTRAVENOUS | Status: DC | PRN
  Filled 2015-08-30: qty 5
  Filled 2015-08-30: qty 10

## 2015-08-30 MED ORDER — ZOLPIDEM TARTRATE 5 MG PO TABS: 5.0000 mg | ORAL_TABLET | Freq: Every evening | ORAL | Status: DC | PRN

## 2015-08-30 MED ORDER — EPHEDRINE 5 MG/ML INJ
10.0000 mg | INTRAVENOUS | Status: DC | PRN
  Filled 2015-08-30: qty 2

## 2015-08-30 MED ORDER — ACETAMINOPHEN 325 MG PO TABS
650.0000 mg | ORAL_TABLET | ORAL | Status: DC | PRN
  Administered 2015-08-30 – 2015-09-01 (×4): 650 mg via ORAL
  Filled 2015-08-30 (×4): qty 2

## 2015-08-30 MED ORDER — LACTATED RINGERS IV SOLN: 500.0000 mL | Freq: Once | INTRAVENOUS | Status: DC

## 2015-08-30 MED ORDER — SIMETHICONE 80 MG PO CHEW: 80.0000 mg | CHEWABLE_TABLET | ORAL | Status: DC | PRN

## 2015-08-30 MED ORDER — OXYCODONE-ACETAMINOPHEN 5-325 MG PO TABS
1.0000 | ORAL_TABLET | ORAL | Status: DC | PRN
  Administered 2015-08-30: 1 via ORAL
  Filled 2015-08-30: qty 1

## 2015-08-30 MED ORDER — ONDANSETRON HCL 4 MG/2ML IJ SOLN
4.0000 mg | Freq: Four times a day (QID) | INTRAMUSCULAR | Status: DC | PRN
  Administered 2015-08-30: 4 mg via INTRAVENOUS
  Filled 2015-08-30: qty 2

## 2015-08-30 MED ORDER — ONDANSETRON HCL 4 MG/2ML IJ SOLN: 4.0000 mg | INTRAMUSCULAR | Status: DC | PRN

## 2015-08-30 MED ORDER — WITCH HAZEL-GLYCERIN EX PADS: 1.0000 "application " | MEDICATED_PAD | CUTANEOUS | Status: DC | PRN

## 2015-08-30 MED ORDER — LIDOCAINE HCL (PF) 1 % IJ SOLN
30.0000 mL | INTRAMUSCULAR | Status: DC | PRN
  Filled 2015-08-30: qty 30

## 2015-08-30 MED ORDER — BUTORPHANOL TARTRATE 1 MG/ML IJ SOLN
INTRAMUSCULAR | Status: AC
  Filled 2015-08-30: qty 1

## 2015-08-30 MED ORDER — TETANUS-DIPHTH-ACELL PERTUSSIS 5-2.5-18.5 LF-MCG/0.5 IM SUSP: 0.5000 mL | Freq: Once | INTRAMUSCULAR | Status: DC

## 2015-08-30 MED ORDER — IBUPROFEN 600 MG PO TABS
600.0000 mg | ORAL_TABLET | Freq: Four times a day (QID) | ORAL | Status: DC
  Administered 2015-08-30 – 2015-09-01 (×8): 600 mg via ORAL
  Filled 2015-08-30 (×8): qty 1

## 2015-08-30 MED ORDER — FLEET ENEMA 7-19 GM/118ML RE ENEM: 1.0000 | ENEMA | RECTAL | Status: DC | PRN

## 2015-08-30 MED ORDER — LACTATED RINGERS IV SOLN
INTRAVENOUS | Status: DC
  Administered 2015-08-30: 04:00:00 via INTRAVENOUS

## 2015-08-30 MED ORDER — SOD CITRATE-CITRIC ACID 500-334 MG/5ML PO SOLN: 30.0000 mL | ORAL | Status: DC | PRN

## 2015-08-30 MED ORDER — OXYCODONE-ACETAMINOPHEN 5-325 MG PO TABS: 1.0000 | ORAL_TABLET | ORAL | Status: DC | PRN

## 2015-08-30 MED ORDER — ACETAMINOPHEN 325 MG PO TABS: 650.0000 mg | ORAL_TABLET | ORAL | Status: DC | PRN

## 2015-08-30 MED ORDER — OXYCODONE-ACETAMINOPHEN 5-325 MG PO TABS: 2.0000 | ORAL_TABLET | ORAL | Status: DC | PRN

## 2015-08-30 MED ORDER — DIBUCAINE 1 % RE OINT
1.0000 | TOPICAL_OINTMENT | RECTAL | Status: DC | PRN
Start: 2015-08-30 — End: 2015-09-01

## 2015-08-30 MED ORDER — PRENATAL MULTIVITAMIN CH
1.0000 | ORAL_TABLET | Freq: Every day | ORAL | Status: DC
  Administered 2015-08-30 – 2015-08-31 (×2): 1 via ORAL
  Filled 2015-08-30 (×2): qty 1

## 2015-08-30 MED ORDER — COCONUT OIL OIL: 1.0000 "application " | TOPICAL_OIL | Status: DC | PRN

## 2015-08-30 MED ORDER — FENTANYL 2.5 MCG/ML BUPIVACAINE 1/10 % EPIDURAL INFUSION (WH - ANES)
14.0000 mL/h | INTRAMUSCULAR | Status: DC | PRN
  Administered 2015-08-30: 14 mL/h via EPIDURAL
  Filled 2015-08-30: qty 125

## 2015-08-30 MED ORDER — PENICILLIN G POTASSIUM 5000000 UNITS IJ SOLR
5.0000 10*6.[IU] | Freq: Once | INTRAVENOUS | Status: AC
  Administered 2015-08-30: 5 10*6.[IU] via INTRAVENOUS
  Filled 2015-08-30: qty 5

## 2015-08-30 MED ORDER — DIPHENHYDRAMINE HCL 25 MG PO CAPS: 25.0000 mg | ORAL_CAPSULE | Freq: Four times a day (QID) | ORAL | Status: DC | PRN

## 2015-08-30 MED ORDER — PHENYLEPHRINE 40 MCG/ML (10ML) SYRINGE FOR IV PUSH (FOR BLOOD PRESSURE SUPPORT)
80.0000 ug | PREFILLED_SYRINGE | INTRAVENOUS | Status: DC | PRN
  Filled 2015-08-30: qty 10
  Filled 2015-08-30: qty 5

## 2015-08-30 MED ORDER — OXYTOCIN 40 UNITS IN LACTATED RINGERS INFUSION - SIMPLE MED
2.5000 [IU]/h | INTRAVENOUS | Status: DC
  Filled 2015-08-30: qty 1000

## 2015-08-30 MED ORDER — FERROUS SULFATE 325 (65 FE) MG PO TABS
325.0000 mg | ORAL_TABLET | Freq: Two times a day (BID) | ORAL | Status: DC
  Administered 2015-08-30 – 2015-08-31 (×3): 325 mg via ORAL
  Filled 2015-08-30 (×3): qty 1

## 2015-08-30 MED ORDER — OXYTOCIN BOLUS FROM INFUSION
500.0000 mL | INTRAVENOUS | Status: DC
  Administered 2015-08-30: 500 mL via INTRAVENOUS

## 2015-08-30 MED ORDER — ONDANSETRON HCL 4 MG PO TABS: 4.0000 mg | ORAL_TABLET | ORAL | Status: DC | PRN

## 2015-08-30 MED ORDER — PENICILLIN G POTASSIUM 5000000 UNITS IJ SOLR
2.5000 10*6.[IU] | INTRAVENOUS | Status: DC
  Filled 2015-08-30 (×2): qty 2.5

## 2015-08-30 MED ORDER — DIPHENHYDRAMINE HCL 50 MG/ML IJ SOLN
12.5000 mg | INTRAMUSCULAR | Status: DC | PRN
  Administered 2015-08-30: 12.5 mg via INTRAVENOUS
  Filled 2015-08-30: qty 1

## 2015-08-30 MED ORDER — LIDOCAINE HCL (PF) 1 % IJ SOLN
INTRAMUSCULAR | Status: DC | PRN
  Administered 2015-08-30 (×2): 6 mL

## 2015-08-30 MED ORDER — LACTATED RINGERS IV SOLN
500.0000 mL | Freq: Once | INTRAVENOUS | Status: AC
  Administered 2015-08-30: 500 mL via INTRAVENOUS

## 2015-08-30 MED ORDER — BENZOCAINE-MENTHOL 20-0.5 % EX AERO
1.0000 "application " | INHALATION_SPRAY | CUTANEOUS | Status: DC | PRN
  Administered 2015-08-30: 1 via TOPICAL
  Filled 2015-08-30: qty 56

## 2015-08-30 MED ORDER — BUTORPHANOL TARTRATE 1 MG/ML IJ SOLN
1.0000 mg | INTRAMUSCULAR | Status: DC | PRN
  Administered 2015-08-30: 1 mg via INTRAVENOUS

## 2015-08-30 MED ORDER — SENNOSIDES-DOCUSATE SODIUM 8.6-50 MG PO TABS
2.0000 | ORAL_TABLET | ORAL | Status: DC
  Administered 2015-08-31 (×2): 2 via ORAL
  Filled 2015-08-30 (×2): qty 2

## 2015-08-30 NOTE — Lactation Note (Addendum)
This note was copied from a baby's chart. Lactation Consultation Note  Patient Name: Cheryl Bright [id number] Date: 08/30/2015 Reason for consult: Initial assessment  Visited with Mom, baby 5 hrs old.  Baby had one 10 minute feeding at breast (latch score 7), and 1 bottle formula 9 ml.  Talked about benefit of exclusive breastfeeding early on.  Encouraged keeping baby skin to skin and feeding at breast often when baby cues.  Mom has all 3 sons in room, and baby swaddled in crib.  She breast fed her first 2 sons (79 and 30 yo), but her 38 month old had a painful latch and she pumped for 2 months.  This baby latched well per Mom.  Reviewed basics of a good latch, and encouraged Mom to call for assistance as needed.   Mom has history of cigarette smoking, alcohol use, and marijuana use (1 per week) during pregnancy.   Brochure left in room, and informed Mom of IP lactation services available to her.   To call prn, and LC to follow up in am.   Consult Status Consult Status: Follow-up Date: 08/31/15 Follow-up type: In-patient    Judee Clara 08/30/2015, 12:36 PM

## 2015-08-30 NOTE — Anesthesia Procedure Notes (Addendum)
Epidural Patient location during procedure: OB  Staffing Anesthesiologist: Sherrian Divers  Preanesthetic Checklist Completed: patient identified, site marked, surgical consent, pre-op evaluation, timeout performed, IV checked, risks and benefits discussed and monitors and equipment checked  Epidural Patient position: sitting Prep: DuraPrep Patient monitoring: blood pressure and heart rate Approach: midline Location: L3-L4 Injection technique: LOR saline  Needle:  Needle type: Tuohy  Needle gauge: 17 G Needle length: 9 cm Needle insertion depth: 6 cm Catheter type: closed end flexible Catheter size: 19 Gauge Catheter at skin depth: 13 cm Test dose: negative and Other  Assessment Events: blood not aspirated, injection not painful, no injection resistance, negative IV test and no paresthesia  Additional Notes Reason for block:procedure for pain non latex gloves were used.

## 2015-08-30 NOTE — Progress Notes (Signed)
K-Pad given for abdominal cramping.

## 2015-08-30 NOTE — Anesthesia Preprocedure Evaluation (Signed)
Anesthesia Evaluation  Patient identified by MRN, date of birth, ID band Patient awake    Reviewed: Allergy & Precautions, NPO status , Patient's Chart, lab work & pertinent test results  Airway Mallampati: II  TM Distance: >3 FB Neck ROM: Full    Dental no notable dental hx.    Pulmonary Current Smoker,    Pulmonary exam normal breath sounds clear to auscultation       Cardiovascular negative cardio ROS Normal cardiovascular exam Rhythm:Regular Rate:Normal     Neuro/Psych PSYCHIATRIC DISORDERS Depression negative neurological ROS     GI/Hepatic negative GI ROS, Neg liver ROS,   Endo/Other  negative endocrine ROS  Renal/GU negative Renal ROS  negative genitourinary   Musculoskeletal negative musculoskeletal ROS (+)   Abdominal   Peds negative pediatric ROS (+)  Hematology negative hematology ROS (+)   Anesthesia Other Findings   Reproductive/Obstetrics negative OB ROS                             Anesthesia Physical Anesthesia Plan  ASA: II  Anesthesia Plan: Epidural   Post-op Pain Management:    Induction: Intravenous  Airway Management Planned: Natural Airway  Additional Equipment:   Intra-op Plan:   Post-operative Plan:   Informed Consent: I have reviewed the patients History and Physical, chart, labs and discussed the procedure including the risks, benefits and alternatives for the proposed anesthesia with the patient or authorized representative who has indicated his/her understanding and acceptance.   Dental advisory given  Plan Discussed with: CRNA  Anesthesia Plan Comments: (Informed consent obtained prior to proceeding including risk of failure, 1% risk of PDPH, risk of minor discomfort and bruising.  Discussed rare but serious complications including epidural abscess, permanent nerve injury, epidural hematoma.  Discussed alternatives to epidural analgesia and  patient desires to proceed.  Timeout performed pre-procedure verifying patient name, procedure, and platelet count.  Patient tolerated procedure well. )        Anesthesia Quick Evaluation

## 2015-08-30 NOTE — MAU Note (Addendum)
PT  SAYS SROM   AT 0142    -  WHILE ASLEEP-  WENT    TO B-ROOM-   FLUID  NOT   STOP.   DR MARSHALL  FOR PNC-  NO VE.     DENIES HSV AND MRSA.    GBS-    GBS-  UNSURE        FEELS   SOME UC'S.

## 2015-08-30 NOTE — H&P (Signed)
Cheryl Bright is a 30 y.o. female presenting for SROM and UC's. Maternal Medical History:  Reason for admission: Rupture of membranes and contractions.   Fetal activity: Perceived fetal activity is normal.   Last perceived fetal movement was within the past hour.    Prenatal complications: no prenatal complications Prenatal Complications - Diabetes: none.    OB History    Gravida Para Term Preterm AB TAB SAB Ectopic Multiple Living   5 3 3  1  1   0 3     Past Medical History  Diagnosis Date  . No pertinent past medical history   . Depression   . MS (multiple sclerosis) [facility])    Past Surgical History  Procedure Laterality Date  . Foot surgery    . Cesarean section     Family History: family history includes Multiple sclerosis in her sister. Social History:  reports that she has been smoking Cigarettes.  She has a 2.5 pack-year smoking history. She has never used smokeless tobacco. She reports that she drinks alcohol. She reports that she uses illicit drugs (Marijuana).   Prenatal Transfer Tool  Maternal Diabetes: No Genetic Screening: Normal Maternal Ultrasounds/Referrals: Normal Fetal Ultrasounds or other Referrals:  None Maternal Substance Abuse:  Yes Significant Maternal Medications:  None Significant Maternal Lab Results:  None Other Comments:  None  Review of Systems  All other systems reviewed and are negative.   Dilation: 5 Effacement (%): 70 Station: -2, -1 Exam by:: [place] RN Blood pressure 130/91, pulse 84, temperature 98.1 F (36.7 C), temperature source Oral, resp. rate 18, height 5\' 2"  (1.575 m), weight 199 lb 12 oz (90.606 kg), last menstrual period 01/20/2015, unknown if currently breastfeeding. Maternal Exam:  Uterine Assessment: Contraction frequency is regular.   Abdomen: Patient reports no abdominal tenderness. Fetal presentation: vertex  Introitus: Normal vulva. Normal vagina.  Cervix: Cervix evaluated by digital exam.     Physical Exam   Nursing note and vitals reviewed. Constitutional: She is oriented to person, place, and time. She appears well-developed and well-nourished.  HENT:  Head: Normocephalic and atraumatic.  Eyes: Conjunctivae are normal. Pupils are equal, round, and reactive to light.  Neck: Normal range of motion. Neck supple.  Cardiovascular: Normal rate and regular rhythm.   Respiratory: Effort normal.  GI: Soft.  Genitourinary: Vagina normal and uterus normal.  Musculoskeletal: Normal range of motion.  Neurological: She is alert and oriented to person, place, and time.  Skin: Skin is warm and dry.  Psychiatric: She has a normal mood and affect. Her behavior is normal. Judgment and thought content normal.    Prenatal labs: ABO, Rh: --/--/O POS (07/30 0850) Antibody: NEG (07/30 0850) Rubella:   RPR: Non Reactive (07/30 0850)  HBsAg:    HIV: Non Reactive (12/27 1255)  GBS:     Assessment/Plan: 38 weeks.  Active labor.  Admit.   HARPER,CHARLES A 08/30/2015, 4:14 AM

## 2015-08-30 NOTE — Anesthesia Postprocedure Evaluation (Signed)
Anesthesia Post Note  Patient: Cheryl Bright  Procedure(s) Performed: * No procedures listed *  Patient location during evaluation: Mother Baby Anesthesia Type: Epidural Level of consciousness: awake and alert Pain management: pain level controlled Vital Signs Assessment: post-procedure vital signs reviewed and stable Respiratory status: spontaneous breathing, nonlabored ventilation and respiratory function stable Cardiovascular status: stable Postop Assessment: no headache, no backache and epidural receding Anesthetic complications: no     Last Vitals:  Filed Vitals:   08/30/15 0915 08/30/15 1015  BP: 120/66 128/85  Pulse: 74 66  Temp: 37 C 36.7 C  Resp: 18 18    Last Pain:  Filed Vitals:   08/30/15 1249  PainSc: 5    Pain Goal: Patients Stated Pain Goal: 3 (08/30/15 1249)               Junious Silk

## 2015-08-30 NOTE — Progress Notes (Signed)
UR chart review completed.  

## 2015-08-31 LAB — CBC
HEMATOCRIT: 24.1 % — AB (ref 36.0–46.0)
HEMOGLOBIN: 8.6 g/dL — AB (ref 12.0–15.0)
MCH: 30.7 pg (ref 26.0–34.0)
MCHC: 35.7 g/dL (ref 30.0–36.0)
MCV: 86.1 fL (ref 78.0–100.0)
Platelets: 276 10*3/uL (ref 150–400)
RBC: 2.8 MIL/uL — ABNORMAL LOW (ref 3.87–5.11)
RDW: 14 % (ref 11.5–15.5)
WBC: 11.2 10*3/uL — ABNORMAL HIGH (ref 4.0–10.5)

## 2015-08-31 NOTE — Progress Notes (Signed)
Patient ID: Cheryl Bright, female   DOB: [birth year], 30 y.o.   MRN: [id number] Postpartum day one Blood pressure 130/80 pulse 78 respiration 18 afebrile Fundus firm Lochia moderate Legs negative doing well

## 2015-08-31 NOTE — Progress Notes (Signed)
CSW attempted to meet with MOB at 2:45pm and 3:20pm for a consult for hx of substance use.  MOB was in the shower during both attempts.  CSW will attempt to meet with MOB on 09/01/15.

## 2015-08-31 NOTE — Lactation Note (Signed)
This note was copied from a baby's chart. Lactation Consultation Note  Patient Name: Girl Matisyn Post UXLKG'M Date: 08/31/2015 Reason for consult: Follow-up assessment Baby at 34 hr of life. Mom wants to mostly formula feed. She plans to f/u with [facility] tomorrow. Discussed engorgement, mastitis, and pumping. She is aware of lactation services and support group. She will call as needed.    Maternal Data    Feeding Feeding Type: Formula Nipple Type: Slow - flow  LATCH Score/Interventions                      Lactation Tools Discussed/Used WIC Program: Yes   Consult Status Consult Status: Complete    Rulon Eisenmenger 08/31/2015, 5:51 PM

## 2015-09-01 NOTE — Discharge Instructions (Signed)
Discharge instructions   You can wash your hair  Shower  Eat what you want  Drink what you want  See me in 6 weeks  Your ankles are going to swell more in the next 2 weeks than when pregnant  No sex for 6 weeks   Marlei Glomski A, MD 09/01/2015

## 2015-09-01 NOTE — Clinical Social Work Maternal (Signed)
CLINICAL SOCIAL WORK MATERNAL/CHILD NOTE  Patient Details  Name: Cheryl Bright MRN: [id number] Date of Birth: 07/29/1985  Date:  09/01/2015  Clinical Social Worker Initiating Note:  Angel Boyd-Gilyard Date/ Time Initiated:  09/01/15/0956     Child's Name:  Cheryl Bright   Legal Guardian:  Mother   Need for Interpreter:  None   Date of Referral:  08/31/15     Reason for Referral:  Behavioral Health Issues, including SI , Current Substance Use/Substance Use During Pregnancy    Referral Source:  Central Nursery   Address:  [address] [place] [address]  Phone number:  [phone]   Household Members:  Self, Minor Children, Parents   Natural Supports (not living in the home):  Immediate Family, Extended Family   Professional Supports: Case Manager/Social Worker ([employer])   Employment: Unemployed   Type of Work:     Education:  High school graduate   Financial Resources:  Medicaid   Other Resources:  WIC, Food Stamps    Cultural/Religious Considerations Which May Impact Care:  Per MOB's Face Sheet, MOB non-denominational   Strengths:  Ability to meet basic needs , Home prepared for child , Pediatrician chosen    Risk Factors/Current Problems:  Substance Use , Mental Health Concerns    Cognitive State:  Alert , Linear Thinking , Insightful    Mood/Affect:  Calm , Bright , Interested , Happy    CSW Assessment:  CSW met with MOB to complete an assessment for a consult for hx of substance use and hx of depression. MOB had two-room guest when CSW arrived, and MOB introduced her room quest as her bio mother and sister.  MOB gave CSW permission to meet her while her mother and sister were present.  MOB was engaged and appeared to be interested with meeting with CSW.  CSW inquired about MOB's hx of substance use, and MOB acknowledged that she utilized marijuana throughout her pregnancy. CSW informed MOB of the hospital's drug screen policy, and informed  MOB of the two screenings for the infant. MOB was understanding and communicated that she did not have any questions.  CSW thanked MOB for being honest, and informed her that the infant had a negative UDS for THC. MOB disclosed that has not used marijuana in the past week, but has used consistently utilized marijuana throughout her pregnancy. CSW offered MOB resources and referrals for substance abuse and MOB declined the information.   CSW inquired about MOB's hx of depression, and MOB stated that when she was diagnosed with MS in 2014, she became depressed and was prescribed medication (MOB could not recall the name of medication).  MOB communication that she was consistent with her medication for about 6 months, and decided the medication was no longer needed.  MOB denies any signs of symptoms of depression since 2014.  MOB also denies PPD with her older 3 children. CSW educated MOB about PPD.  CSW informed MOB of possible supports and interventions to decrease PPD and reviewed supports for MOB. CSW encouraged MOB to seek medical attention if needed for increased signs and symptoms for PPD. CSW reviewed safe sleep and SIDS. MOB was knowledgeable and stated that she received information regarding SIDS with her older children.   MOB communicated that she has a pack and play and a car seat for the baby.  MOB stated she feels prepared to care for her infant and is excited about being discharged from the hospital. MOB's sister and mother   were not engaged with CSW, but were attentive to MOB's infant.  CSW thanked MOB for meeting with CSW.  CSW Plan/Description:  Patient/Family Education , No Further Intervention Required/No Barriers to Discharge (CSW will monitor cord and will make a report to CPS if warranted)    Delayna Sparlin D BOYD-GILYARD, LCSW 09/01/2015, 10:00 AM

## 2015-09-01 NOTE — Progress Notes (Signed)
Patient ID: Cheryl Bright, female   DOB: 04-Jan-1986, 30 y.o.   MRN: [id number] Postop postpartum day 2 Blood pressure 03/30/1972 pulse 77 respiration 18 Fundus firm Lochia moderate legs negative doing well home today

## 2015-09-01 NOTE — Discharge Summary (Signed)
Obstetric Discharge Summary Reason for Admission: onset of labor Prenatal Procedures: none Intrapartum Procedures: spontaneous vaginal delivery Postpartum Procedures: none Complications-Operative and Postpartum: none HEMOGLOBIN  Date Value Ref Range Status  08/31/2015 8.6* 12.0 - 15.0 g/dL Final   HCT  Date Value Ref Range Status  08/31/2015 24.1* 36.0 - 46.0 % Final    Physical Exam:  General: alert Lochia: appropriate Uterine Fundus: firm Incision: healing well DVT Evaluation: No evidence of DVT seen on physical exam.  Discharge Diagnoses: Term Pregnancy-delivered  Discharge Information: Date: 09/01/2015 Activity: pelvic rest Diet: routine Medications: Percocet Condition: stable Instructions: refer to practice specific booklet Discharge to: home Follow-up Information    Follow up with HARPER,CHARLES A, MD.   Specialty:  Obstetrics and Gynecology   Contact information:   [address] [place] [address] [phone]       Newborn Data: Live born female  Birth Weight: 6 lb 7.4 oz (2930 g) APGAR: 8, 9  Home with mother.  MARSHALL,BERNARD A 09/01/2015, 6:37 AM

## 2015-09-09 ENCOUNTER — Other Ambulatory Visit: Payer: Self-pay | Admitting: Obstetrics & Gynecology

## 2015-09-09 DIAGNOSIS — O165 Unspecified maternal hypertension, complicating the puerperium: Secondary | ICD-10-CM

## 2015-09-09 MED ORDER — HYDROCHLOROTHIAZIDE 12.5 MG PO TABS: 12.5000 mg | ORAL_TABLET | Freq: Every day | ORAL | Status: DC

## 2015-11-03 ENCOUNTER — Ambulatory Visit: Payer: Medicaid Other | Admitting: Obstetrics and Gynecology

## 2015-11-03 ENCOUNTER — Telehealth: Payer: Self-pay | Admitting: *Deleted

## 2015-11-03 ENCOUNTER — Encounter: Payer: Self-pay | Admitting: *Deleted

## 2015-11-03 NOTE — Telephone Encounter (Signed)
Cheryl Bright missed a scheduled appointment for a postpartum visit. Called patient and left a message she missed an appointment - call our office to reschedule if desired. Will send letter.

## 2015-11-15 ENCOUNTER — Other Ambulatory Visit (HOSPITAL_COMMUNITY)
Admission: RE | Admit: 2015-11-15 | Discharge: 2015-11-15 | Disposition: A | Discharge status: Home / Self Care | Payer: Medicaid Other | Source: Ambulatory Visit | Attending: Advanced Practice Midwife | Admitting: Advanced Practice Midwife

## 2015-11-15 ENCOUNTER — Encounter: Payer: Self-pay | Admitting: Family Medicine

## 2015-11-15 ENCOUNTER — Encounter: Payer: Self-pay | Admitting: Advanced Practice Midwife

## 2015-11-15 ENCOUNTER — Ambulatory Visit (INDEPENDENT_AMBULATORY_CARE_PROVIDER_SITE_OTHER): Payer: Medicaid Other | Admitting: Advanced Practice Midwife

## 2015-11-15 VITALS — BP 138/94 | HR 78 | Wt 178.9 lb

## 2015-11-15 DIAGNOSIS — Z3202 Encounter for pregnancy test, result negative: Secondary | ICD-10-CM | POA: Diagnosis not present

## 2015-11-15 DIAGNOSIS — Z113 Encounter for screening for infections with a predominantly sexual mode of transmission: Secondary | ICD-10-CM

## 2015-11-15 DIAGNOSIS — Z3042 Encounter for surveillance of injectable contraceptive: Secondary | ICD-10-CM | POA: Diagnosis not present

## 2015-11-15 DIAGNOSIS — Z30013 Encounter for initial prescription of injectable contraceptive: Secondary | ICD-10-CM

## 2015-11-15 DIAGNOSIS — Z202 Contact with and (suspected) exposure to infections with a predominantly sexual mode of transmission: Secondary | ICD-10-CM

## 2015-11-15 DIAGNOSIS — Z8742 Personal history of other diseases of the female genital tract: Secondary | ICD-10-CM

## 2015-11-15 DIAGNOSIS — IMO0001 Reserved for inherently not codable concepts without codable children: Secondary | ICD-10-CM

## 2015-11-15 LAB — POCT PREGNANCY, URINE: PREG TEST UR: NEGATIVE

## 2015-11-15 MED ORDER — MEDROXYPROGESTERONE ACETATE 150 MG/ML IM SUSP
150.0000 mg | Freq: Once | INTRAMUSCULAR | Status: AC
  Administered 2015-11-15: 150 mg via INTRAMUSCULAR

## 2015-11-15 MED ORDER — MEDROXYPROGESTERONE ACETATE 150 MG/ML IM SUSP: 150.0000 mg | Freq: Once | INTRAMUSCULAR | Status: DC

## 2015-11-15 NOTE — Progress Notes (Signed)
Subjective:     Cheryl Bright is a 30 y.o. female who presents for a postpartum visit. She is 10 weeks postpartum following a spontaneous vaginal delivery. I have fully reviewed the prenatal and intrapartum course. The delivery was at Term gestational weeks. Outcome: spontaneous vaginal delivery. Anesthesia: epidural. Postpartum course has been uneventful. Baby's course has been uneventful. Baby is feeding by bottle - Similac Advance. Bleeding no bleeding. Bowel function is normal. Bladder function is normal. Patient is sexually active. Contraception method is Depo-Provera injections. Postpartum depression screening: negative.  The following portions of the patient's history were reviewed and updated as appropriate: allergies, current medications, past family history, past medical history, past social history, past surgical history and problem list.  Review of Systems Pertinent items are noted in HPI.   Objective:    BP (!) 138/94   Pulse 78   Wt 178 lb 14.4 oz (81.1 kg)   LMP 11/15/2015   Breastfeeding? No   BMI 32.72 kg/m   General:  alert, cooperative and no distress   Breasts:  inspection negative, no nipple discharge or bleeding, no masses or nodularity palpable  Lungs: clear to auscultation bilaterally  Heart:  regular rate and rhythm, S1, S2 normal, no murmur, click, rub or gallop  Abdomen: soft, non-tender; bowel sounds normal; no masses,  no organomegaly   Vulva:  not evaluated  Vagina: not evaluated  Cervix:  n/a  Corpus: not examined  Adnexa:  not evaluated  Rectal Exam: Not performed.        Assessment:     Normal postpartum exam. Pap smear not done at today's visit.   Plan:    1. Contraception: Depo-Provera injections 2. Wants to get tubes tied at some point 3. Follow up in: 3 months or as needed.

## 2015-11-15 NOTE — Patient Instructions (Signed)
Laparoscopic Tubal Ligation Laparoscopic tubal ligation is a procedure that closes the fallopian tubes at a time other than right after childbirth. When the fallopian tubes are closed, the eggs that are released from the ovaries cannot enter the uterus, and sperm cannot reach the egg. Tubal ligation is also known as getting your "tubes tied." Tubal ligation is done so you will not be able to get pregnant or have a baby. Although this procedure may be undone (reversed), it should be considered permanent and irreversible. If you want to have future pregnancies, you should not have this procedure. LET [facility] CARE PROVIDER KNOW ABOUT:  Any allergies you have.  All medicines you are taking, including vitamins, herbs, eye drops, creams, and over-the-counter medicines. This includes any use of steroids, either by mouth or in cream form.  Previous problems you or members of your family have had with the use of anesthetics.  Any blood disorders you have.  Previous surgeries you have had.  Any medical conditions you may have.  Possibility of pregnancy, if this applies.  Any past pregnancies. RISKS AND COMPLICATIONS  Infection.  Bleeding.  Injury to surrounding organs.  Side effects from anesthetics.  Failure of the procedure.  Ectopic pregnancy.  Future regret about having the procedure done. BEFORE THE PROCEDURE  Ask your health care provider about:  Changing or stopping your regular medicines. This is especially important if you are taking diabetes medicines or blood thinners.  Taking medicines such as aspirin and ibuprofen. These medicines can thin your blood. Do not take these medicines before your procedure if your health care provider instructs you not to.  Follow instructions from your health care provider about eating and drinking restrictions.  Plan to have someone take you home after the procedure.  If you go home right after the procedure, plan to have someone  with you for 24 hours. PROCEDURE  You will be given one or more of the following:  A medicine that helps you relax (sedative).  A medicine that numbs the area (local anesthetic).  A medicine that makes you fall asleep (general anesthetic).  A medicine that is injected into an area of your body that numbs everything below the injection site (regional anesthetic).  If you have been given general anesthetic, a tube will be put down your throat to help you breathe.  Two small cuts (incisions) will be made in the lower abdominal area and near the belly button.  Your bladder may be emptied with a small tube (catheter).  Your abdomen will be inflated with a safe gas (carbon dioxide). This will help to give the surgeon room to operate and visualize, and it will help the surgeon to avoid other organs.  A thin, lighted tube (laparoscope) with a camera attached will be inserted into your abdomen through one of the incisions near the belly button. Other small instruments will be inserted through the other abdominal incision.  The fallopian tubes will be tied off or burned (cauterized), or they will be blocked with a clip, ring, or clamp. In many cases, a small portion in the center of each fallopian tube will also be removed.  After the fallopian tubes are blocked, the gas will be released from the abdomen.  The incisions will be closed with stitches (sutures).  A bandage (dressing) will be placed over the incisions. The procedure may vary among health care providers and hospitals. AFTER THE PROCEDURE  Your blood pressure, heart rate, breathing rate, and blood oxygen level  will be monitored often until the medicines you were given have worn off.  You will be given pain medicine as needed.  If you had general anesthetic, you may have some mild discomfort in your throat. This is from the breathing tube that was placed in your throat while you were sleeping.  You may experience discomfort in  the shoulder area from some trapped air between your liver and your diaphragm. This sensation is normal, and it will slowly go away on its own.  You will have some mild abdominal discomfort for 3--7 days.   This information is not intended to replace advice given to you by your health care provider. Make sure you discuss any questions you have with your health care provider.   Document Released: 05/28/2000 Document Revised: 07/06/2014 Document Reviewed: 06/02/2011 Elsevier Interactive Patient Education 2016 [employer]. Sexually Transmitted Disease A sexually transmitted disease (STD) is a disease or infection that may be passed (transmitted) from person to person, usually during sexual activity. This may happen by way of saliva, semen, blood, vaginal mucus, or urine. Common STDs include:  Gonorrhea.  Chlamydia.  Syphilis.  HIV and AIDS.  Genital herpes.  Hepatitis B and C.  Trichomonas.  Human papillomavirus (HPV).  Pubic lice.  Scabies.  Mites.  Bacterial vaginosis. WHAT ARE CAUSES OF STDs? An STD may be caused by bacteria, a virus, or parasites. STDs are often transmitted during sexual activity if one person is infected. However, they may also be transmitted through nonsexual means. STDs may be transmitted after:   Sexual intercourse with an infected person.  Sharing sex toys with an infected person.  Sharing needles with an infected person or using unclean piercing or tattoo needles.  Having intimate contact with the genitals, mouth, or rectal areas of an infected person.  Exposure to infected fluids during birth. WHAT ARE THE SIGNS AND SYMPTOMS OF STDs? Different STDs have different symptoms. Some people may not have any symptoms. If symptoms are present, they may include:  Painful or bloody urination.  Pain in the pelvis, abdomen, vagina, anus, throat, or eyes.  A skin rash, itching, or irritation.  Growths, ulcerations, blisters, or sores in the genital  and anal areas.  Abnormal vaginal discharge with or without bad odor.  Penile discharge in men.  Fever.  Pain or bleeding during sexual intercourse.  Swollen glands in the groin area.  Yellow skin and eyes (jaundice). This is seen with hepatitis.  Swollen testicles.  Infertility.  Sores and blisters in the mouth. HOW ARE STDs DIAGNOSED? To make a diagnosis, your health care provider may:  Take a medical history.  Perform a physical exam.  Take a sample of any discharge to examine.  Swab the throat, cervix, opening to the penis, rectum, or vagina for testing.  Test a sample of your first morning urine.  Perform blood tests.  Perform a Pap test, if this applies.  Perform a colposcopy.  Perform a laparoscopy. HOW ARE STDs TREATED? Treatment depends on the STD. Some STDs may be treated but not cured.  Chlamydia, gonorrhea, trichomonas, and syphilis can be cured with antibiotic medicine.  Genital herpes, hepatitis, and HIV can be treated, but not cured, with prescribed medicines. The medicines lessen symptoms.  Genital warts from HPV can be treated with medicine or by freezing, burning (electrocautery), or surgery. Warts may come back.  HPV cannot be cured with medicine or surgery. However, abnormal areas may be removed from the cervix, vagina, or vulva.  If your  diagnosis is confirmed, your recent sexual partners need treatment. This is true even if they are symptom-free or have a negative culture or evaluation. They should not have sex until their health care providers say it is okay.  Your health care provider may test you for infection again 3 months after treatment. HOW CAN I REDUCE MY RISK OF GETTING AN STD? Take these steps to reduce your risk of getting an STD:  Use latex condoms, dental dams, and water-soluble lubricants during sexual activity. Do not use petroleum jelly or oils.  Avoid having multiple sex partners.  Do not have sex with someone who has  other sex partners  Do not have sex with anyone you do not know or who is at high risk for an STD.  Avoid risky sex practices that can break your skin.  Do not have sex if you have open sores on your mouth or skin.  Avoid drinking too much alcohol or taking illegal drugs. Alcohol and drugs can affect your judgment and put you in a vulnerable position.  Avoid engaging in oral and anal sex acts.  Get vaccinated for HPV and hepatitis. If you have not received these vaccines in the past, talk to your health care provider about whether one or both might be right for you.  If you are at risk of being infected with HIV, it is recommended that you take a prescription medicine daily to prevent HIV infection. This is called pre-exposure prophylaxis (PrEP). You are considered at risk if:  You are a man who has sex with other men (MSM).  You are a heterosexual man or woman and are sexually active with more than one partner.  You take drugs by injection.  You are sexually active with a partner who has HIV.  Talk with your health care provider about whether you are at high risk of being infected with HIV. If you choose to begin PrEP, you should first be tested for HIV. You should then be tested every 3 months for as long as you are taking PrEP. WHAT SHOULD I DO IF I THINK I HAVE AN STD?  See your health care provider.  Tell your sexual partner(s). They should be tested and treated for any STDs.  Do not have sex until your health care provider says it is okay. WHEN SHOULD I GET IMMEDIATE MEDICAL CARE? Contact your health care provider right away if:   You have severe abdominal pain.  You are a man and notice swelling or pain in your testicles.  You are a woman and notice swelling or pain in your vagina.   This information is not intended to replace advice given to you by your health care provider. Make sure you discuss any questions you have with your health care provider.   Document  Released: 05/12/2002 Document Revised: 03/12/2014 Document Reviewed: 09/09/2012 Elsevier Interactive Patient Education [employer]2016 Elsevier Inc.

## 2015-11-16 LAB — HEPATITIS B SURFACE ANTIGEN: HEP B S AG: NEGATIVE

## 2015-11-16 LAB — HIV ANTIBODY (ROUTINE TESTING W REFLEX): HIV 1&2 Ab, 4th Generation: NONREACTIVE

## 2015-11-16 LAB — RPR

## 2015-11-16 LAB — GC/CHLAMYDIA PROBE AMP (~~LOC~~) NOT AT ARMC
Chlamydia: NEGATIVE
NEISSERIA GONORRHEA: NEGATIVE

## 2015-11-29 ENCOUNTER — Encounter: Payer: Self-pay | Admitting: *Deleted

## 2015-12-08 ENCOUNTER — Ambulatory Visit (HOSPITAL_COMMUNITY)
Admission: EM | Admit: 2015-12-08 | Discharge: 2015-12-08 | Disposition: A | Discharge status: Home / Self Care | Payer: Medicaid Other | Attending: Internal Medicine | Admitting: Internal Medicine

## 2015-12-08 ENCOUNTER — Encounter (HOSPITAL_COMMUNITY): Payer: Self-pay | Admitting: Emergency Medicine

## 2015-12-08 DIAGNOSIS — M549 Dorsalgia, unspecified: Secondary | ICD-10-CM | POA: Insufficient documentation

## 2015-12-08 DIAGNOSIS — N12 Tubulo-interstitial nephritis, not specified as acute or chronic: Secondary | ICD-10-CM | POA: Insufficient documentation

## 2015-12-08 DIAGNOSIS — F17210 Nicotine dependence, cigarettes, uncomplicated: Secondary | ICD-10-CM | POA: Insufficient documentation

## 2015-12-08 LAB — POCT URINALYSIS DIP (DEVICE)
BILIRUBIN URINE: NEGATIVE
GLUCOSE, UA: NEGATIVE mg/dL
KETONES UR: 15 mg/dL — AB
Nitrite: POSITIVE — AB
Protein, ur: >=300 mg/dL — AB
SPECIFIC GRAVITY, URINE: 1.02 (ref 1.005–1.030)
Urobilinogen, UA: 2 mg/dL — ABNORMAL HIGH (ref 0.0–1.0)
pH: 6.5 (ref 5.0–8.0)

## 2015-12-08 LAB — POCT PREGNANCY, URINE: PREG TEST UR: NEGATIVE

## 2015-12-08 MED ORDER — LEVOFLOXACIN 750 MG PO TABS: 750.0000 mg | ORAL_TABLET | Freq: Every day | ORAL | 0 refills | Status: AC

## 2015-12-08 NOTE — Discharge Instructions (Signed)
PLENTY OF FLUIDS  IF YOU DEVELOP FEVER OR WORSENING PAIN YOU SHOULD GO TO THE ER

## 2015-12-08 NOTE — ED Provider Notes (Signed)
CSN: [id number]     Arrival date & time 12/08/15  1002 History   First MD Initiated Contact with Patient 12/08/15 1037     Chief Complaint  Patient presents with  . Back Pain   (Consider location/radiation/quality/duration/timing/severity/associated sxs/prior Treatment) HPI THIS IS A NEW PROBLEM PT IS A 30 Y/O FEMALE WITH A 2 DAY HX OF RIGHT FLANK PAIN. NO KNOWN INJURY, HAS NOTED BLOOD IN URINE. NO HX OF KS. WORKS AT [employer], NOT STRENUOUS. ASSOC VOMITING, NAUSEA, NO KNOWN FEVER, BUT FELT HOT.  Past Medical History:  Diagnosis Date  . Depression   . MS (multiple sclerosis) (HCC)   . No pertinent past medical history    Past Surgical History:  Procedure Laterality Date  . CESAREAN SECTION    . FOOT SURGERY     Family History  Problem Relation Age of Onset  . Multiple sclerosis Sister    Social History  Substance Use Topics  . Smoking status: Current Every Day Smoker    Packs/day: 0.50    Years: 5.00    Types: Cigarettes  . Smokeless tobacco: Never Used  . Alcohol use 0.0 oz/week     Comment: several times per month   OB History    Gravida Para Term Preterm AB Living   5 4 4   1 4    SAB TAB Ectopic Multiple Live Births   1     0 4     Review of Systems  Denies: HEADACHE, NAUSEA, ABDOMINAL PAIN, CHEST PAIN, CONGESTION, DYSURIA, SHORTNESS OF BREATH  Allergies  Sulfa drugs cross reactors and Latex  Home Medications   Prior to Admission medications   Medication Sig Start Date End Date Taking? Authorizing Provider  hydrochlorothiazide (HYDRODIURIL) 12.5 MG tablet Take 1 tablet (12.5 mg total) by mouth daily. Patient not taking: Reported on 12/08/2015 09/09/15   Adam PhenixJames G Arnold, MD   Meds Ordered and Administered this Visit  Medications - No data to display  BP 126/80 (BP Location: Left Arm)   Pulse 93   Temp 98.4 F (36.9 C) (Oral)   Resp 18   LMP 11/15/2015 Comment: started depo last month 11/2015  SpO2 99%  No data found.   Physical Exam NURSES NOTES AND  VITAL SIGNS REVIEWED. CONSTITUTIONAL: Well developed, well nourished, no acute distress HEENT: normocephalic, atraumatic EYES: Conjunctiva normal NECK:normal ROM, supple, no adenopathy PULMONARY:No respiratory distress, normal effort ABDOMINAL: Soft, ND, NT BS+, RIGHT CVAT MUSCULOSKELETAL: Normal ROM of all extremities,  SKIN: warm and dry without rash PSYCHIATRIC: Mood and affect, behavior are normal  Urgent Care Course   Clinical Course   URINE CULTURE PENDING.  PRESUMED PYELO INFECTION, ALTHOUGH NO FEVER. NO DYSURIA.  Procedures (including critical care time)  Labs Review Labs Reviewed - No data to display  Imaging Review No results found.   Visual Acuity Review  Right Eye Distance:   Left Eye Distance:   Bilateral Distance:    Right Eye Near:   Left Eye Near:    Bilateral Near:         MDM   1. Pyelonephritis    Patient is reassured that there are no issues that require transfer to higher level of care at this time or additional tests. Patient is advised to continue home symptomatic treatment. Patient is advised that if there are new or worsening symptoms to attend the emergency department, contact primary care provider, or return to UC. Instructions of care provided discharged home in stable condition.  THIS NOTE WAS GENERATED USING A VOICE RECOGNITION SOFTWARE PROGRAM. ALL REASONABLE EFFORTS  WERE MADE TO PROOFREAD THIS DOCUMENT FOR ACCURACY.  I have verbally reviewed the discharge instructions with the patient. A printed AVS was given to the patient.  All questions were answered prior to discharge.        Tharon Aquas, PA 12/08/15 [phone]

## 2015-12-08 NOTE — ED Triage Notes (Signed)
Bilateral lower back pain.  Pain started 2 days ago.  Right is more painful than left.  Patient reports increase in urination.  No known injury.  Denies burning with urination.  Denies vaginal discharge.

## 2015-12-10 LAB — URINE CULTURE

## 2015-12-19 ENCOUNTER — Telehealth: Payer: Self-pay | Admitting: General Practice

## 2015-12-19 MED ORDER — NORETHINDRONE ACETATE 5 MG PO TABS: 10.0000 mg | ORAL_TABLET | Freq: Every day | ORAL | 0 refills | Status: DC

## 2015-12-19 NOTE — Telephone Encounter (Signed)
Patient called and left message stating she received the depo shot last month and has been bleeding for 3 weeks. Called patient back and discussed normal irregular bleeding patterns post initial depo injection. Patient verbalized understanding & wants to know if there is anything we can do. Spoke with Dr Vergie LivingPickens who authorized order for aygestin (order in epic). Informed patient of Rx and instructions. Patient verbalized understanding & had no questions

## 2016-02-01 ENCOUNTER — Ambulatory Visit: Payer: Medicaid Other

## 2016-02-01 ENCOUNTER — Telehealth: Payer: Self-pay | Admitting: Obstetrics & Gynecology

## 2016-02-01 NOTE — Telephone Encounter (Signed)
Called patient to inform her that her Depo injection would have to be rescheduled. We will call her as soon as the meds is available.

## 2016-02-08 ENCOUNTER — Ambulatory Visit: Payer: Medicaid Other

## 2017-01-11 ENCOUNTER — Emergency Department (HOSPITAL_BASED_OUTPATIENT_CLINIC_OR_DEPARTMENT_OTHER)
Admission: EM | Admit: 2017-01-11 | Discharge: 2017-01-11 | Disposition: A | Discharge status: Home / Self Care | Payer: Self-pay | Attending: Emergency Medicine | Admitting: Emergency Medicine

## 2017-01-11 ENCOUNTER — Encounter (HOSPITAL_BASED_OUTPATIENT_CLINIC_OR_DEPARTMENT_OTHER): Payer: Self-pay

## 2017-01-11 ENCOUNTER — Other Ambulatory Visit: Payer: Self-pay

## 2017-01-11 DIAGNOSIS — G43909 Migraine, unspecified, not intractable, without status migrainosus: Secondary | ICD-10-CM | POA: Insufficient documentation

## 2017-01-11 DIAGNOSIS — F17210 Nicotine dependence, cigarettes, uncomplicated: Secondary | ICD-10-CM | POA: Insufficient documentation

## 2017-01-11 MED ORDER — KETOROLAC TROMETHAMINE 60 MG/2ML IM SOLN
30.0000 mg | Freq: Once | INTRAMUSCULAR | Status: AC
Start: 2017-01-11 — End: 2017-01-11
  Administered 2017-01-11: 30 mg via INTRAMUSCULAR
  Filled 2017-01-11: qty 2

## 2017-01-11 MED ORDER — DIPHENHYDRAMINE HCL 50 MG/ML IJ SOLN: 25.0000 mg | Freq: Once | INTRAMUSCULAR | Status: DC

## 2017-01-11 MED ORDER — METOCLOPRAMIDE HCL 5 MG/ML IJ SOLN
10.0000 mg | Freq: Once | INTRAMUSCULAR | Status: AC
  Administered 2017-01-11: 10 mg via INTRAVENOUS
  Filled 2017-01-11: qty 2

## 2017-01-11 MED ORDER — DIPHENHYDRAMINE HCL 25 MG PO CAPS
25.0000 mg | ORAL_CAPSULE | Freq: Once | ORAL | Status: AC
  Administered 2017-01-11: 25 mg via ORAL
  Filled 2017-01-11: qty 1

## 2017-01-11 MED ORDER — SODIUM CHLORIDE 0.9 % IV BOLUS (SEPSIS)
500.0000 mL | Freq: Once | INTRAVENOUS | Status: AC
  Administered 2017-01-11: 500 mL via INTRAVENOUS

## 2017-01-11 NOTE — ED Notes (Signed)
Pt educated about not driving or performing other critical tasks (such as operating heavy machinery, caring for infant/toddler/child) due to sedative nature of medications received in ED. Also warned about risks of consuming alcohol or taking other medications with sedative properties. Pt/caregiver verbalized understanding.  

## 2017-01-11 NOTE — ED Notes (Signed)
ED Provider at bedside. 

## 2017-01-11 NOTE — ED Provider Notes (Signed)
[facility] EMERGENCY DEPARTMENT Provider Note   CSN: [id number] Arrival date & time: 01/11/17  1242     History   Chief Complaint Chief Complaint  Patient presents with  . Headache    HPI Cheryl Bright is a 31 y.o. female presenting with progressive onset right-sided headache for the last 3 days which she describes as pressure and there is in intensity at times. She has tried Tylenol without relief. She states that she has had headaches like this in the past but they are usually relieved with sleep at this time sleep did not help.  She denies any visual disturbances, nausea, vomiting, fever, chills or focal deficits.   HPI  Past Medical History:  Diagnosis Date  . Depression   . MS (multiple sclerosis) (HCC)   . No pertinent past medical history     Patient Active Problem List   Diagnosis Date Noted  . Active labor 08/30/2015  . Pregnancy 10/02/2014  . NVD (normal vaginal delivery) 10/02/2014  . [redacted] weeks gestation of pregnancy   . Encounter for fetal anatomic survey   . Maternal multiple sclerosis   . PCOS (polycystic ovarian syndrome) 09/15/2012    Past Surgical History:  Procedure Laterality Date  . CESAREAN SECTION    . FOOT SURGERY      OB History    Gravida Para Term Preterm AB Living   5 4 4   1 4    SAB TAB Ectopic Multiple Live Births   1     0 4       Home Medications    Prior to Admission medications   Not on File    Family History Family History  Problem Relation Age of Onset  . Multiple sclerosis Sister     Social History Social History   Tobacco Use  . Smoking status: Current Every Day Smoker    Packs/day: 0.50    Years: 5.00    Pack years: 2.50    Types: Cigarettes  . Smokeless tobacco: Never Used  Substance Use Topics  . Alcohol use: Yes    Alcohol/week: 0.0 oz    Comment: occ  . Drug use: Yes    Types: Marijuana     Allergies   Sulfa drugs cross reactors and Latex   Review of Systems Review of  Systems  Constitutional: Negative for chills and fever.  HENT: Positive for congestion. Negative for facial swelling, sore throat and trouble swallowing.   Eyes: Negative for photophobia, pain, redness and visual disturbance.  Respiratory: Negative for cough, chest tightness, shortness of breath, wheezing and stridor.   Cardiovascular: Negative for chest pain and leg swelling.  Gastrointestinal: Negative for abdominal pain, nausea and vomiting.  Musculoskeletal: Negative for arthralgias, back pain, gait problem, joint swelling, myalgias, neck pain and neck stiffness.  Skin: Negative for color change, pallor and rash.  Neurological: Positive for headaches. Negative for dizziness, tremors, seizures, syncope, facial asymmetry, speech difficulty, weakness, light-headedness and numbness.  Psychiatric/Behavioral: Negative for behavioral problems.     Physical Exam Updated Vital Signs BP (!) 141/88   Pulse 76   Temp 97.9 F (36.6 C) (Oral)   Resp 15   Ht 5\' 2"  (1.575 m)   Wt 88.5 kg (195 lb)   LMP 12/14/2016   SpO2 99%   BMI 35.67 kg/m   Physical Exam  Constitutional: She is oriented to person, place, and time. She appears well-developed and well-nourished.  Non-toxic appearance. She does not appear ill. No distress.  Afebrile, nontoxic-appearing, lying comfortably in bed in no acute distress.  HENT:  Head: Normocephalic and atraumatic.  Mouth/Throat: Oropharynx is clear and moist.  Eyes: Conjunctivae and EOM are normal. Pupils are equal, round, and reactive to light. Right eye exhibits normal extraocular motion and no nystagmus. Left eye exhibits normal extraocular motion and no nystagmus. Right pupil is round and reactive. Left pupil is round and reactive. Pupils are equal.  Neck: Normal range of motion. Neck supple. No neck rigidity. No Brudzinski's sign and no Kernig's sign noted.  Cardiovascular: Normal rate, regular rhythm, normal heart sounds and intact distal pulses.  No murmur  heard. Pulmonary/Chest: Effort normal and breath sounds normal. No stridor. No respiratory distress. She has no wheezes. She has no rales.  Abdominal: She exhibits no distension.  Musculoskeletal: Normal range of motion. She exhibits no edema or tenderness.  Neurological: She is alert and oriented to person, place, and time. She has normal strength. She is not disoriented. No cranial nerve deficit or sensory deficit. She displays a negative Romberg sign. Coordination and gait normal. GCS eye subscore is 4. GCS verbal subscore is 5. GCS motor subscore is 6.  Neurologic Exam:  - Mental status: Patient is alert and cooperative. Fluent speech and words are clear. Coherent thought processes and insight is good. Patient is oriented x 4 to person, place, time and event.  - Cranial nerves:  CN III, IV, VI: pupils equally round, reactive to light both direct and conscensual. Full extra-ocular movement. CN V: motor temporalis and masseter strength intact. CN VII : muscles of facial expression intact. CN X :  midline uvula. XI strength of sternocleidomastoid and trapezius muscles 5/5, XII: tongue is midline when protruded. - Motor: No involuntary movements. Muscle tone and bulk normal throughout. Muscle strength is 5/5 in bilateral shoulder abduction, elbow flexion and extension, grip, hip extension, flexion, leg flexion and extension, ankle dorsiflexion and plantar flexion.  - Sensory: Proprioception, light tough sensation intact in all extremities. - Cerebellar: rapid alternating movements and point to point movement intact in upper and lower extremities. Normal stance and gait.  Skin: Skin is warm and dry. No rash noted. She is not diaphoretic. No cyanosis or erythema. No pallor.  Psychiatric: She has a normal mood and affect.  Nursing note and vitals reviewed.    ED Treatments / Results  Labs (all labs ordered are listed, but only abnormal results are displayed) Labs Reviewed - No data to  display  EKG  EKG Interpretation None       Radiology No results found.  Procedures Procedures (including critical care time)  Medications Ordered in ED Medications  metoCLOPramide (REGLAN) injection 10 mg (10 mg Intravenous Given 01/11/17 1459)  sodium chloride 0.9 % bolus 500 mL (0 mLs Intravenous Stopped 01/11/17 1544)  diphenhydrAMINE (BENADRYL) capsule 25 mg (25 mg Oral Given 01/11/17 1457)  ketorolac (TORADOL) injection 30 mg (30 mg Intramuscular Given 01/11/17 1601)     Initial Impression / Assessment and Plan / ED Course  I have reviewed the triage vital signs and the nursing notes.  Pertinent labs & imaging results that were available during my care of the patient were reviewed by me and considered in my medical decision making (see chart for details).     Patient presenting with a progressive onset right-sided headache afebrile nontoxic. Normal neuro exam  Will give migraine cocktail and reassess On reassessment, patient reported significant improvement, no longer nauseated. She states that she still had a slight headache.  Patient was given Toradol and reported further improvement and feeling ready to go home.  Discharge home with close follow-up with PCP.   Discussed strict return precautions and advised to return to the emergency department if experiencing any new or worsening symptoms. Instructions were understood and patient agreed with discharge plan.  Final Clinical Impressions(s) / ED Diagnoses   Final diagnoses:  Migraine without status migrainosus, not intractable, unspecified migraine type    ED Discharge Orders    None       Gregary Cromer 01/11/17 1725    Arby Barrette, MD 01/12/17 [phone]

## 2017-01-11 NOTE — ED Triage Notes (Signed)
C/o HA x 3 days-NAD-steady gait 

## 2017-01-11 NOTE — Discharge Instructions (Signed)
As discussed, make sure you stay well-hydrated and follow-up with your primary care provider or the wellness Center. Return if you experience severe headache, visual disturbances, nausea vomiting, loss of sensation or other new concerning symptoms in the meantime.

## 2017-01-13 ENCOUNTER — Encounter (HOSPITAL_BASED_OUTPATIENT_CLINIC_OR_DEPARTMENT_OTHER): Payer: Self-pay | Admitting: *Deleted

## 2017-01-13 ENCOUNTER — Other Ambulatory Visit: Payer: Self-pay

## 2017-01-13 ENCOUNTER — Emergency Department (HOSPITAL_BASED_OUTPATIENT_CLINIC_OR_DEPARTMENT_OTHER): Payer: Self-pay

## 2017-01-13 ENCOUNTER — Emergency Department (HOSPITAL_BASED_OUTPATIENT_CLINIC_OR_DEPARTMENT_OTHER)
Admission: EM | Admit: 2017-01-13 | Discharge: 2017-01-13 | Disposition: A | Discharge status: Home / Self Care | Payer: Self-pay | Attending: Emergency Medicine | Admitting: Emergency Medicine

## 2017-01-13 DIAGNOSIS — F17210 Nicotine dependence, cigarettes, uncomplicated: Secondary | ICD-10-CM | POA: Insufficient documentation

## 2017-01-13 DIAGNOSIS — R519 Headache, unspecified: Secondary | ICD-10-CM

## 2017-01-13 DIAGNOSIS — R51 Headache: Secondary | ICD-10-CM | POA: Insufficient documentation

## 2017-01-13 DIAGNOSIS — Z91040 Latex allergy status: Secondary | ICD-10-CM | POA: Insufficient documentation

## 2017-01-13 MED ORDER — DIPHENHYDRAMINE HCL 25 MG PO CAPS
25.0000 mg | ORAL_CAPSULE | Freq: Once | ORAL | Status: AC
  Administered 2017-01-13: 25 mg via ORAL
  Filled 2017-01-13: qty 1

## 2017-01-13 MED ORDER — BUTALBITAL-APAP-CAFFEINE 50-325-40 MG PO TABS: 1.0000 | ORAL_TABLET | Freq: Four times a day (QID) | ORAL | 0 refills | Status: AC | PRN

## 2017-01-13 MED ORDER — DIPHENHYDRAMINE HCL 50 MG/ML IJ SOLN: 25.0000 mg | Freq: Once | INTRAMUSCULAR | Status: DC

## 2017-01-13 MED ORDER — IBUPROFEN 800 MG PO TABS: 800.0000 mg | ORAL_TABLET | Freq: Three times a day (TID) | ORAL | 0 refills | Status: DC

## 2017-01-13 MED ORDER — KETOROLAC TROMETHAMINE 30 MG/ML IJ SOLN
30.0000 mg | Freq: Once | INTRAMUSCULAR | Status: AC
  Administered 2017-01-13: 30 mg via INTRAVENOUS
  Filled 2017-01-13: qty 1

## 2017-01-13 MED ORDER — DEXAMETHASONE SODIUM PHOSPHATE 10 MG/ML IJ SOLN
10.0000 mg | Freq: Once | INTRAMUSCULAR | Status: AC
  Administered 2017-01-13: 10 mg via INTRAVENOUS
  Filled 2017-01-13: qty 1

## 2017-01-13 MED ORDER — PROCHLORPERAZINE EDISYLATE 5 MG/ML IJ SOLN
10.0000 mg | Freq: Once | INTRAMUSCULAR | Status: AC
  Administered 2017-01-13: 10 mg via INTRAVENOUS
  Filled 2017-01-13: qty 2

## 2017-01-13 NOTE — ED Notes (Signed)
Patient transported to CT 

## 2017-01-13 NOTE — ED Provider Notes (Signed)
[facility] EMERGENCY DEPARTMENT Provider Note   CSN: [id number] Arrival date & time: 01/13/17  4098     History   Chief Complaint Chief Complaint  Patient presents with  . Headache    HPI Cheryl Bright is a 31 y.o. female.  HPI  The patient is a 31 year old female, she was recently seen in the emergency department on January 11, 2017 approximately 2 days ago with a headache.  She reports to me that this headache is right-sided in location, throbbing, temporal, aching, fluctuates in intensity, it is worse with bending over, it came on very gradually, she has no family history of aneurysms and she has had no history of any injuries.  She has been taking Tylenol without any relief, currently the pain is 8 out of 10.  The patient denies any fevers chills nausea vomiting or diarrhea and has had no blurry vision.  She states that she does have photophobia, a mild cough, mild sore throat, mild nasal congestion.  When she was in the emergency department 2 days ago she had normal vital signs except for very minimal hypertension.  She had an unremarkable exam, she was given a combination of Reglan, Benadryl and Toradol and states that she felt better.  She went home that night but the headache came back.  She does report that she has a history of multiple sclerosis but takes no daily medications for that.  She reports that she has had no neurologic symptoms that she had with her multiple sclerosis including tingling and burning sensations in quite some time.  She reports that she usually does not get headaches and feels like this is the first time in her life where she has actually had a headache of concern.  She does report that she has been drinking liquor to help with the pain  Past Medical History:  Diagnosis Date  . Depression   . MS (multiple sclerosis) (HCC)   . No pertinent past medical history     Patient Active Problem List   Diagnosis Date Noted  . Active labor  08/30/2015  . Pregnancy 10/02/2014  . NVD (normal vaginal delivery) 10/02/2014  . [redacted] weeks gestation of pregnancy   . Encounter for fetal anatomic survey   . Maternal multiple sclerosis   . PCOS (polycystic ovarian syndrome) 09/15/2012    Past Surgical History:  Procedure Laterality Date  . CESAREAN SECTION    . FOOT SURGERY      OB History    Gravida Para Term Preterm AB Living   5 4 4   1 4    SAB TAB Ectopic Multiple Live Births   1     0 4       Home Medications    Prior to Admission medications   Medication Sig Start Date End Date Taking? Authorizing Provider  butalbital-acetaminophen-caffeine (FIORICET, ESGIC) 50-325-40 MG tablet Take 1-2 tablets every 6 (six) hours as needed by mouth for headache. 01/13/17 01/13/18  Eber Hong, MD  ibuprofen (ADVIL,MOTRIN) 800 MG tablet Take 1 tablet (800 mg total) 3 (three) times daily by mouth. 01/13/17   Eber Hong, MD    Family History Family History  Problem Relation Age of Onset  . Multiple sclerosis Sister     Social History Social History   Tobacco Use  . Smoking status: Current Every Day Smoker    Packs/day: 0.50    Years: 5.00    Pack years: 2.50    Types: Cigarettes  . Smokeless  tobacco: Never Used  Substance Use Topics  . Alcohol use: Yes    Alcohol/week: 0.0 oz    Comment: occ  . Drug use: Yes    Types: Marijuana     Allergies   Sulfa drugs cross reactors and Latex   Review of Systems Review of Systems  All other systems reviewed and are negative.    Physical Exam Updated Vital Signs BP (!) 153/104 (BP Location: Left Arm)   Pulse 74   Temp 97.9 F (36.6 C) (Oral)   Resp 16   Ht 5\' 2"  (1.575 m)   Wt 88.5 kg (195 lb)   LMP 12/14/2016 (Exact Date)   SpO2 100%   BMI 35.67 kg/m   Physical Exam  Constitutional: She appears well-developed and well-nourished. No distress.  HENT:  Head: Normocephalic and atraumatic.  Mouth/Throat: Oropharynx is clear and moist. No oropharyngeal  exudate.  Pharynx is clear and moist, no erythema exudate asymmetry or hypertrophy  Eyes: Conjunctivae and EOM are normal. Pupils are equal, round, and reactive to light. Right eye exhibits no discharge. Left eye exhibits no discharge. No scleral icterus.  Neck: Normal range of motion. Neck supple. No JVD present. No thyromegaly present.  Cardiovascular: Normal rate, regular rhythm, normal heart sounds and intact distal pulses. Exam reveals no gallop and no friction rub.  No murmur heard. Pulmonary/Chest: Effort normal and breath sounds normal. No respiratory distress. She has no wheezes. She has no rales.  Abdominal: Soft. Bowel sounds are normal. She exhibits no distension and no mass. There is no tenderness.  Musculoskeletal: Normal range of motion. She exhibits no edema or tenderness.  Lymphadenopathy:    She has no cervical adenopathy.  Neurological: She is alert. Coordination normal.  Speech is clear, cranial nerves III through XII are intact, memory is intact, strength is normal in all 4 extremities including grips, sensation is intact to light touch and pinprick in all 4 extremities. Coordination as tested by finger-nose-finger is normal, no limb ataxia. Normal gait, normal reflexes at the patellar tendons bilaterally  Skin: Skin is warm and dry. No rash noted. No erythema.  Psychiatric: She has a normal mood and affect. Her behavior is normal.  Nursing note and vitals reviewed.    ED Treatments / Results  Labs (all labs ordered are listed, but only abnormal results are displayed) Labs Reviewed - No data to display  EKG  EKG Interpretation None       Radiology Ct Head Wo Contrast  Result Date: 01/13/2017 CLINICAL DATA:  Headache for 6 days.  Hypertension. EXAM: CT HEAD WITHOUT CONTRAST TECHNIQUE: Contiguous axial images were obtained from the base of the skull through the vertex without intravenous contrast. COMPARISON:  None. FINDINGS: Brain: The ventricles are normal in  size and configuration. There is a slight degree of invagination of CSF into the sella, a finding of doubtful clinical significance. There is no intracranial mass, hemorrhage, extra-axial fluid collection, or midline shift. Gray-white compartments are normal. No acute infarct evident. Vascular: There is no hyperdense vessel. No vascular calcification evident. Skull: Bony calvarium appears intact. Sinuses/Orbits: There is an air-fluid level with moderate opacification in the right maxillary antrum. There is a probable retention cyst along the anterior inferior right maxillary antrum. There is opacification of multiple ethmoid air cells on the right. There is extensive opacification in the right sphenoid sinus with air-fluid level. There is opacification in the inferior right frontal sinus. Left-sided paranasal sinuses appear clear except for some slight mucosal thickening in  the left sphenoid sinus. Orbits appear symmetric bilaterally. Other: Mastoids on the right are clear. There is opacification of multiple mastoid air cells on the left. IMPRESSION: Multilevel paranasal sinus disease on the right with air-fluid levels in the right maxillary antrum and right sphenoid region. Opacification of multiple right-sided ethmoid air cells noted. Left-sided mastoid air cell disease with opacification of most of the mastoid air cells on the left. No intracranial mass hemorrhage, or extra-axial fluid collection. Gray-white compartments appear normal. Electronically Signed   By: Bretta Bang III M.D.   On: 01/13/2017 09:13    Procedures Procedures (including critical care time)  Medications Ordered in ED Medications  dexamethasone (DECADRON) injection 10 mg (not administered)  prochlorperazine (COMPAZINE) injection 10 mg (10 mg Intravenous Given 01/13/17 0908)  ketorolac (TORADOL) 30 MG/ML injection 30 mg (30 mg Intravenous Given 01/13/17 0908)  diphenhydrAMINE (BENADRYL) capsule 25 mg (25 mg Oral Given 01/13/17  0908)     Initial Impression / Assessment and Plan / ED Course  I have reviewed the triage vital signs and the nursing notes.  Pertinent labs & imaging results that were available during my care of the patient were reviewed by me and considered in my medical decision making (see chart for details).     The patient was given medications including Compazine, Benadryl and Toradol, she states that her appetite is great and she is eating plenty of food and is not nauseated.  She has no stiff neck on my exam, neurologically she is totally normal, vital signs are also unremarkable.  Due to the second headache and ongoing headache over the last 6 days we will obtain some neuro imaging given her underlying history of MS.  She is not at risk for hemorrhage and has no other findings to be consistent with infection.  I do not think that the patient needs a lumbar puncture at this time.  Vitals:   01/13/17 0758  BP: (!) 153/104  Pulse: 74  Resp: 16  Temp: 97.9 F (36.6 C)  SpO2: 100%   Improved spontaneously, blood pressure looks better, headache is now minimal, Decadron given to prevent rebound headache.  The patient was also given prescriptions for home including ibuprofen and Fioricet and counseled on the use of Fioricet.  The pt was given sedativetype medications while in the emergency dept.  The patient was instructed on the possible side effects and potential allergic reactions associated with said medications and agreed to their use.  I also instructed the patient not to perform certain activities after use of these medications such as driving a vehicle and performing child care.  They were instructed not to ingest alcohol or other medications that may cause excessive sleepiness, tranquilizers or CNS depressant medications.  They have expressed their understanding.  If the pt was given opiate medications for home by prescription they were reminded of these precautions as well.    Final Clinical  Impressions(s) / ED Diagnoses   Final diagnoses:  Right-sided headache    ED Discharge Orders        Ordered    ibuprofen (ADVIL,MOTRIN) 800 MG tablet  3 times daily     01/13/17 1018    butalbital-acetaminophen-caffeine (FIORICET, ESGIC) 50-325-40 MG tablet  Every 6 hours PRN     01/13/17 1018       Eber Hong, MD 01/13/17 1019

## 2017-01-13 NOTE — ED Triage Notes (Signed)
C/o posterior headache that radiates up the back of her head on the right side. Denies any n/v or vision issues. Describes as a throbbing type pain. Was seen here on Friday for same. Has taken tylenol 500mg  last night with no relief.  Denies history of headaches. No obvious neuro issues.

## 2017-01-13 NOTE — ED Notes (Signed)
Patient is resting comfortably. 

## 2017-01-13 NOTE — Discharge Instructions (Signed)

## 2017-01-13 NOTE — ED Notes (Signed)
Given soda and snack. 

## 2018-02-24 ENCOUNTER — Inpatient Hospital Stay (HOSPITAL_COMMUNITY)
Admission: AD | Admit: 2018-02-24 | Discharge: 2018-02-24 | Disposition: A | Discharge status: Home / Self Care | Payer: Self-pay | Attending: Obstetrics & Gynecology | Admitting: Obstetrics & Gynecology

## 2018-02-24 ENCOUNTER — Encounter (HOSPITAL_COMMUNITY): Payer: Self-pay | Admitting: *Deleted

## 2018-02-24 ENCOUNTER — Inpatient Hospital Stay (HOSPITAL_COMMUNITY): Payer: Self-pay

## 2018-02-24 DIAGNOSIS — O4690 Antepartum hemorrhage, unspecified, unspecified trimester: Secondary | ICD-10-CM

## 2018-02-24 DIAGNOSIS — A5901 Trichomonal vulvovaginitis: Secondary | ICD-10-CM | POA: Insufficient documentation

## 2018-02-24 DIAGNOSIS — O3680X0 Pregnancy with inconclusive fetal viability, not applicable or unspecified: Secondary | ICD-10-CM | POA: Insufficient documentation

## 2018-02-24 DIAGNOSIS — F17210 Nicotine dependence, cigarettes, uncomplicated: Secondary | ICD-10-CM | POA: Insufficient documentation

## 2018-02-24 DIAGNOSIS — Z3A01 Less than 8 weeks gestation of pregnancy: Secondary | ICD-10-CM | POA: Insufficient documentation

## 2018-02-24 DIAGNOSIS — O26891 Other specified pregnancy related conditions, first trimester: Secondary | ICD-10-CM | POA: Insufficient documentation

## 2018-02-24 DIAGNOSIS — O99331 Smoking (tobacco) complicating pregnancy, first trimester: Secondary | ICD-10-CM | POA: Insufficient documentation

## 2018-02-24 DIAGNOSIS — Z791 Long term (current) use of non-steroidal anti-inflammatories (NSAID): Secondary | ICD-10-CM | POA: Insufficient documentation

## 2018-02-24 DIAGNOSIS — O10911 Unspecified pre-existing hypertension complicating pregnancy, first trimester: Secondary | ICD-10-CM | POA: Insufficient documentation

## 2018-02-24 DIAGNOSIS — O10919 Unspecified pre-existing hypertension complicating pregnancy, unspecified trimester: Secondary | ICD-10-CM

## 2018-02-24 DIAGNOSIS — D573 Sickle-cell trait: Secondary | ICD-10-CM | POA: Insufficient documentation

## 2018-02-24 DIAGNOSIS — O99341 Other mental disorders complicating pregnancy, first trimester: Secondary | ICD-10-CM | POA: Insufficient documentation

## 2018-02-24 DIAGNOSIS — Z91040 Latex allergy status: Secondary | ICD-10-CM | POA: Insufficient documentation

## 2018-02-24 DIAGNOSIS — O4691 Antepartum hemorrhage, unspecified, first trimester: Secondary | ICD-10-CM | POA: Insufficient documentation

## 2018-02-24 DIAGNOSIS — O23591 Infection of other part of genital tract in pregnancy, first trimester: Secondary | ICD-10-CM

## 2018-02-24 DIAGNOSIS — F329 Major depressive disorder, single episode, unspecified: Secondary | ICD-10-CM | POA: Insufficient documentation

## 2018-02-24 DIAGNOSIS — G35 Multiple sclerosis: Secondary | ICD-10-CM | POA: Insufficient documentation

## 2018-02-24 DIAGNOSIS — Z882 Allergy status to sulfonamides status: Secondary | ICD-10-CM | POA: Insufficient documentation

## 2018-02-24 HISTORY — DX: Gestational (pregnancy-induced) hypertension without significant proteinuria, unspecified trimester: O13.9

## 2018-02-24 HISTORY — DX: Sickle-cell trait: D57.3

## 2018-02-24 LAB — HCG, QUANTITATIVE, PREGNANCY: hCG, Beta Chain, Quant, S: 48225 m[IU]/mL — ABNORMAL HIGH (ref <5)

## 2018-02-24 LAB — WET PREP, GENITAL
Sperm: NONE SEEN
Yeast Wet Prep HPF POC: NONE SEEN

## 2018-02-24 LAB — URINALYSIS, ROUTINE W REFLEX MICROSCOPIC
Bilirubin Urine: NEGATIVE
Glucose, UA: NEGATIVE mg/dL
Ketones, ur: NEGATIVE mg/dL
NITRITE: NEGATIVE
PROTEIN: NEGATIVE mg/dL
Specific Gravity, Urine: 1.01 (ref 1.005–1.030)
pH: 6 (ref 5.0–8.0)

## 2018-02-24 LAB — CBC
HCT: 38.7 % (ref 36.0–46.0)
Hemoglobin: 13.4 g/dL (ref 12.0–15.0)
MCH: 30.9 pg (ref 26.0–34.0)
MCHC: 34.6 g/dL (ref 30.0–36.0)
MCV: 89.4 fL (ref 80.0–100.0)
Platelets: 293 10*3/uL (ref 150–400)
RBC: 4.33 MIL/uL (ref 3.87–5.11)
RDW: 13.7 % (ref 11.5–15.5)
WBC: 7.5 10*3/uL (ref 4.0–10.5)
nRBC: 0 % (ref 0.0–0.2)

## 2018-02-24 LAB — POCT PREGNANCY, URINE: PREG TEST UR: POSITIVE — AB

## 2018-02-24 MED ORDER — METRONIDAZOLE 500 MG PO TABS
2000.0000 mg | ORAL_TABLET | Freq: Once | ORAL | Status: AC
  Administered 2018-02-24: 2000 mg via ORAL
  Filled 2018-02-24: qty 4

## 2018-02-24 NOTE — MAU Provider Note (Signed)
History     CSN: [id number]  Arrival date and time: 02/24/18 1246   First Provider Initiated Contact with Patient 02/24/18 1335      Chief Complaint  Patient presents with  . Vaginal Bleeding   Cheryl Bright is a 32 y.o. G6P4 who presents to MAU at [redacted]w[redacted]d who presents to MAU with complaints of vaginal bleeding and abdominal pain. She reports vaginal spotting has been occurring for the past 8 days but then increased to bleeding this morning, describes the vaginal bleeding as bright red. Denies passing any clots but reports wearing 1 pad and soaking pad partially. She reports abdominal cramping is associated with vaginal bleeding, rates pain 4/10- has not taken any medication for abdominal pain. She denies nausea/vomiting, urinary symptoms or other concerns. She reports gestational hypertension in previous pregnancies but no hypertension outside of pregnancy per patient.    OB History    Gravida  6   Para  4   Term  4   Preterm      AB  1   Living  4     SAB  1   TAB      Ectopic      Multiple  0   Live Births  4           Past Medical History:  Diagnosis Date  . Depression   . MS (multiple sclerosis) (HCC)   . No pertinent past medical history   . Pregnancy induced hypertension   . Sickle cell trait [facility])     Past Surgical History:  Procedure Laterality Date  . CESAREAN SECTION    . FOOT SURGERY      Family History  Problem Relation Age of Onset  . Multiple sclerosis Sister     Social History   Tobacco Use  . Smoking status: Current Every Day Smoker    Packs/day: 0.25    Years: 5.00    Pack years: 1.25    Types: Cigarettes  . Smokeless tobacco: Never Used  Substance Use Topics  . Alcohol use: Not Currently    Alcohol/week: 0.0 standard drinks    Comment: occ  . Drug use: Not Currently    Types: Marijuana    Allergies:  Allergies  Allergen Reactions  . Sulfa Drugs Cross Reactors Hives and Swelling  . Latex Rash    Medications  Prior to Admission  Medication Sig Dispense Refill Last Dose  . ibuprofen (ADVIL,MOTRIN) 800 MG tablet Take 1 tablet (800 mg total) 3 (three) times daily by mouth. 21 tablet 0     Review of Systems  Constitutional: Negative.   Respiratory: Negative.   Cardiovascular: Negative.   Gastrointestinal: Positive for abdominal pain. Negative for constipation, diarrhea, nausea and vomiting.  Genitourinary: Positive for vaginal bleeding. Negative for difficulty urinating, dysuria, frequency, hematuria, pelvic pain and urgency.  Neurological: Negative.    Physical Exam   Patient Vitals for the past 24 hrs:  BP Temp Temp src Pulse Resp Height Weight  02/24/18 1336 138/87 - - 71 - - -  02/24/18 1306 (!) 152/90 98 F (36.7 C) Oral 70 18 5\' 2"  (1.575 m) 93 kg   Physical Exam  Nursing note and vitals reviewed. Constitutional: She is oriented to person, place, and time. She appears well-developed and well-nourished. No distress.  HENT:  Head: Normocephalic.  Cardiovascular: Normal rate, regular rhythm and normal heart sounds.  Respiratory: Effort normal and breath sounds normal. No respiratory distress. She has no wheezes.  GI:  Soft. Bowel sounds are normal. She exhibits no distension. There is no abdominal tenderness. There is no rebound.  Genitourinary:    Vaginal bleeding present.  There is bleeding in the vagina.    Genitourinary Comments: Pelvic examination: cervix closed (friable cervix), scant bright red vaginal bleeding around cervical os, negative CMT, uterus non tender, no abnormalities of adnexa palpated    Neurological: She is alert and oriented to person, place, and time.  Psychiatric: She has a normal mood and affect. Her behavior is normal. Thought content normal.   MAU Course  Procedures  MDM Orders Placed This Encounter  Procedures  . Wet prep, genital  . US OB LESS THAN 14 WEEKS WITH OB TRANSVAGINAL  . Urinalysis, Routine w reflex microscopic  . CBC  . hCG, quantitative,  pregnancy  . Pregnancy, urine POC   Results for orders placed or performed during the hospital encounter of 02/24/18 (from the past 24 hour(s))  Urinalysis, Routine w reflex microscopic     Status: Abnormal   Collection Time: 02/24/18  1:19 PM  Result Value Ref Range   Color, Urine YELLOW YELLOW   APPearance HAZY (A) CLEAR   Specific Gravity, Urine 1.010 1.005 - 1.030   pH 6.0 5.0 - 8.0   Glucose, UA NEGATIVE NEGATIVE mg/dL   Hgb urine dipstick LARGE (A) NEGATIVE   Bilirubin Urine NEGATIVE NEGATIVE   Ketones, ur NEGATIVE NEGATIVE mg/dL   Protein, ur NEGATIVE NEGATIVE mg/dL   Nitrite NEGATIVE NEGATIVE   Leukocytes, UA TRACE (A) NEGATIVE   RBC / HPF 0-5 0 - 5 RBC/hpf   WBC, UA 0-5 0 - 5 WBC/hpf   Bacteria, UA RARE (A) NONE SEEN   Squamous Epithelial / LPF 11-20 0 - 5   Mucus PRESENT   Pregnancy, urine POC     Status: Abnormal   Collection Time: 02/24/18  1:24 PM  Result Value Ref Range   Preg Test, Ur POSITIVE (A) NEGATIVE  Wet prep, genital     Status: Abnormal   Collection Time: 02/24/18  1:47 PM  Result Value Ref Range   Yeast Wet Prep HPF POC NONE SEEN NONE SEEN   Trich, Wet Prep PRESENT (A) NONE SEEN   Clue Cells Wet Prep HPF POC PRESENT (A) NONE SEEN   WBC, Wet Prep HPF POC MANY (A) NONE SEEN   Sperm NONE SEEN   CBC     Status: None   Collection Time: 02/24/18  1:52 PM  Result Value Ref Range   WBC 7.5 4.0 - 10.5 K/uL   RBC 4.33 3.87 - 5.11 MIL/uL   Hemoglobin 13.4 12.0 - 15.0 g/dL   HCT 45.438.7 09.836.0 - 11.946.0 %   MCV 89.4 80.0 - 100.0 fL   MCH 30.9 26.0 - 34.0 pg   MCHC 34.6 30.0 - 36.0 g/dL   RDW 14.713.7 82.911.5 - 56.215.5 %   Platelets 293 150 - 400 K/uL   nRBC 0.0 0.0 - 0.2 %  hCG, quantitative, pregnancy     Status: Abnormal   Collection Time: 02/24/18  1:52 PM  Result Value Ref Range   hCG, Beta Chain, Quant, S 48,225 (H) <5 mIU/mL   [place] Ob Less Than 14 Weeks With Ob Transvaginal  Result Date: 02/24/2018 CLINICAL DATA:  Vaginal bleeding during pregnancy. EXAM:  OBSTETRIC <14 WK US AND TRANSVAGINAL OB US TECHNIQUE: Both transabdominal and transvaginal ultrasound examinations were performed for complete evaluation of the gestation as well as the maternal uterus, adnexal regions, and pelvic cul-de-sac.  Transvaginal technique was performed to assess early pregnancy. COMPARISON:  None. FINDINGS: Intrauterine gestational sac: None Yolk sac:  Not Visualized. Embryo:  Not Visualized. Subchorionic hemorrhage:  None visualized. Maternal uterus/adnexae: Right ovary: Normal Left ovary: Normal Other :The endometrium is diffusely thickened and heterogeneous measuring 3.9 cm. No increased vascularity identified. Free fluid:  Trace free fluid noted. IMPRESSION: 1. No intrauterine gestational sac, yolk sac, or fetal pole identified. Differential considerations include intrauterine pregnancy too early to be sonographically visualized, missed abortion, or ectopic pregnancy. Followup ultrasound is recommended in 10-14 days for further evaluation. 2. Abnormal heterogeneous appearance of the endometrium measuring 3.9 cm. In the setting of spontaneous abortion findings are suspicious for retained products of conception. Electronically Signed   By: Signa Kell M.D.   On: 02/24/2018 14:36   Consult with Dr Debroah Loop with results of [place], recommends follow up HCG in 48 hours and decision on SAB vs need for D&C then.   Discussed results of [place] and lab work with patient. Education on Trich- treatment for trich in MAU prior to discharge home. Rx for expedited partner therapy given to patient. Follow up scheduled for 12/26 and discussed reasons to return to MAU. Pt stable prior to discharge home.   Assessment and Plan   1. Pregnancy of unknown anatomic location   2. Vaginal bleeding during pregnancy, antepartum   3. Chronic hypertension during pregnancy, antepartum   4. Trichomonal vaginitis during pregnancy in first trimester    Discharge home  Ectopic and miscarriage precautions reviewed   Return to MAU as needed  Follow up as scheduled for stat HCG  Rx for expedited partner therapy given   Follow-up Information    [facility] Go on 02/27/2018.   Specialty:  Obstetrics and Gynecology Why:  Go to clinic on 12/26 for follow up lab work  Contact information: [address] [place] [address] [phone]          Sharyon Cable CNM 02/24/2018, 3:31 PM

## 2018-02-24 NOTE — MAU Note (Signed)
Pt started spotting 8-9 days ago, began bleeding heavier yesterday.  Pos HPT two days ago.  Having mild pelvic pain.

## 2018-02-24 NOTE — Discharge Instructions (Signed)
Expedited Partner Therapy:  °Information Sheet for Patients and Partners  °            ° °You have been offered expedited partner therapy (EPT). This information sheet contains important information and warnings you need to be aware of, so please read it carefully.  ° °Expedited Partner Therapy (EPT) is the clinical practice of treating the sexual partners of persons who receive chlamydia, gonorrhea, or trichomoniasis diagnoses by providing medications or prescriptions to the patient. Patients then provide partners with these therapies without the health-care provider having examined the partner. In other words, EPT is a convenient, fast and private way for patients to help their sexual partners get treated.  ° °Chlamydia and gonorrhea are bacterial infections you get from having sex with a person who is already infected. Trichomoniasis (or “trich”) is a very common sexually transmitted infection (STI) that is caused by infection with a protozoan parasite called Trichomonas vaginalis.  Many people with these infections don’t know it because they feel fine, but without treatment these infections can cause serious health problems, such as pelvic inflammatory disease, ectopic pregnancy, infertility and increased risk of HIV.  ° °It is important to get treated as soon as possible to protect your health, to avoid spreading these infections to others, and to prevent yourself from becoming re-infected. The good news is these infections can be easily cured with proper antibiotic medicine. The best way to take care of your self is to see a doctor or go to your local health department. If you are not able to see a doctor or other medical provider, you should take EPT.  ° ° °Recommended Medication: °EPT for Chlamydia:  Azithromycin (Zithromax) 1 gram orally in a single dose °EPT for Gonorrhea:  Cefixime (Suprax) 400 milligrams orally in a single dose PLUS azithromycin (Zithromax) 1 gram orally in a single dose °EPT for  Trichomoniasis:  Metronidazole (Flagyl) 2 grams orally in a single dose ° ° °These medicines are very safe. However, you should not take them if you have ever had an allergic reaction (like a rash) to any of these medicines: azithromycin (Zithromax), erythromycin, clarithromycin (Biaxin), metronidazole (Flagyl), tinidazole (Tindimax). If you are uncertain about whether you have an allergy, call your medical provider or pharmacist before taking this medicine. If you have a serious, long-term illness like kidney, liver or heart disease, colitis or stomach problems, or you are currently taking other prescription medication, talk to your provider before taking this medication.  ° °Women: If you have lower belly pain, pain during sex, vomiting, or a fever, do not take this medicine. Instead, you should see a medical provider to be certain you do not have pelvic inflammatory disease (PID). PID can be serious and lead to infertility, pregnancy problems or chronic pelvic pain.  ° °Pregnant Women: It is very important for you to see a doctor to get pregnancy services and pre-natal care. These antibiotics for EPT are safe for pregnant women, but you still need to see a medical provider as soon as possible. It is also important to note that Doxycycline is an alternative therapy for chlamydia, but it should not be taken by someone who is pregnant.  ° °Men: If you have pain or swelling in the testicles or a fever, do not take this medicine and see a medical provider.    ° °Men who have sex with men (MSM): MSM in  continue to experience high rates of syphilis and HIV. Many MSM with gonorrhea or   chlamydia could also have syphilis and/or HIV and not know it. If you are a man who has sex with other men, it is very important that you see a medical provider and are tested for HIV and syphilis. EPT is not recommended for gonorrhea for MSM.  Recommended treatment for gonorrhea for MSM is Rocephin (shot) AND azithromycin  due to decreased cure rate.  Please see your medical provider if this is the case.   ° °Along with this information sheet is a prescription for the medicine. If you receive a prescription it will be in your name and will indicate your date of birth, or it will be in the name of “Expedited Partner Therapy”.   In either case, you can have the prescription filled at a pharmacy. You will be responsible for the cost of the medicine, unless you have prescription drug coverage. In that case, you could provide your name so the pharmacy could bill your health plan.  ° °Take the medication as directed. Some people will have a mild, upset stomach, which does not last long. AVOID alcohol 24 hours after taking metronidazole (Flagyl) to reduce the possibility of a disulfiram-like reaction (severe vomiting and abdominal pain).  After taking the medicine, do not have sex for 7 days. Do not share this medicine or give it to anyone else. It is important to tell everyone you have had sex with in the last 60 days that they need to go and get tested for sexually transmitted infections.  ° °Ways to prevent these and other sexually transmitted infections (STIs):  ° °• Abstain from sex. This is the only sure way to avoid getting an STI.  °• Use barrier methods, such as condoms, consistently and correctly.  °• Limit the number of sexual partners.  °• Have regular physical exams, including testing for STIs.  ° °For more information about EPT or other issues pertaining to an STI, please contact your medical provider or the [place] [facility] at [phone] or [email or web address]   ° °

## 2018-02-25 LAB — GC/CHLAMYDIA PROBE AMP (~~LOC~~) NOT AT ARMC
Chlamydia: NEGATIVE
Neisseria Gonorrhea: NEGATIVE

## 2018-02-27 ENCOUNTER — Ambulatory Visit: Payer: Self-pay

## 2018-02-27 DIAGNOSIS — O3680X0 Pregnancy with inconclusive fetal viability, not applicable or unspecified: Secondary | ICD-10-CM

## 2018-02-27 LAB — HCG, QUANTITATIVE, PREGNANCY: hCG, Beta Chain, Quant, S: 79019 m[IU]/mL — ABNORMAL HIGH (ref <5)

## 2018-02-27 NOTE — Progress Notes (Signed)
Pt here today for STAT beta Lab.  Pt reports no pain but bloody show when she wipes.  Pt informed that she will need to be here for at least two hours in which she will get results and f/u.  Pt agreed.  Notified Dr. Alysia PennaErvin pt results.  Provider recommendation to have US scheduled asap.  OB US scheduled for 03/06/17 @ 1300.  Pt notified of results, provider recommendation, and US appt.  Pt stated understanding with no further questions.

## 2018-03-06 ENCOUNTER — Inpatient Hospital Stay (HOSPITAL_COMMUNITY): Payer: Self-pay | Admitting: Anesthesiology

## 2018-03-06 ENCOUNTER — Encounter (HOSPITAL_COMMUNITY)
Admission: AD | Disposition: A | Discharge status: Home / Self Care | Payer: Self-pay | Source: Home / Self Care | Attending: Obstetrics & Gynecology

## 2018-03-06 ENCOUNTER — Ambulatory Visit (INDEPENDENT_AMBULATORY_CARE_PROVIDER_SITE_OTHER): Payer: Self-pay | Admitting: Obstetrics & Gynecology

## 2018-03-06 ENCOUNTER — Ambulatory Visit (HOSPITAL_COMMUNITY)
Admission: AD | Admit: 2018-03-06 | Discharge: 2018-03-06 | Disposition: A | Discharge status: Home / Self Care | Payer: Self-pay | Attending: Obstetrics & Gynecology | Admitting: Obstetrics & Gynecology

## 2018-03-06 ENCOUNTER — Encounter (HOSPITAL_COMMUNITY): Payer: Self-pay | Admitting: *Deleted

## 2018-03-06 ENCOUNTER — Inpatient Hospital Stay (HOSPITAL_COMMUNITY): Payer: Self-pay

## 2018-03-06 ENCOUNTER — Encounter: Payer: Self-pay | Admitting: Obstetrics & Gynecology

## 2018-03-06 ENCOUNTER — Ambulatory Visit (HOSPITAL_COMMUNITY)
Admission: RE | Admit: 2018-03-06 | Discharge: 2018-03-06 | Disposition: A | Discharge status: Home / Self Care | Payer: Self-pay | Source: Ambulatory Visit | Attending: Obstetrics and Gynecology | Admitting: Obstetrics and Gynecology

## 2018-03-06 DIAGNOSIS — O3680X0 Pregnancy with inconclusive fetal viability, not applicable or unspecified: Secondary | ICD-10-CM | POA: Insufficient documentation

## 2018-03-06 DIAGNOSIS — I10 Essential (primary) hypertension: Secondary | ICD-10-CM | POA: Insufficient documentation

## 2018-03-06 DIAGNOSIS — O020 Blighted ovum and nonhydatidiform mole: Secondary | ICD-10-CM

## 2018-03-06 DIAGNOSIS — F17210 Nicotine dependence, cigarettes, uncomplicated: Secondary | ICD-10-CM | POA: Insufficient documentation

## 2018-03-06 DIAGNOSIS — G35 Multiple sclerosis: Secondary | ICD-10-CM | POA: Insufficient documentation

## 2018-03-06 HISTORY — PX: DILATION AND EVACUATION: SHX1459

## 2018-03-06 LAB — CBC
HEMATOCRIT: 38.3 % (ref 36.0–46.0)
Hemoglobin: 13 g/dL (ref 12.0–15.0)
MCH: 30.7 pg (ref 26.0–34.0)
MCHC: 33.9 g/dL (ref 30.0–36.0)
MCV: 90.5 fL (ref 80.0–100.0)
Platelets: 323 10*3/uL (ref 150–400)
RBC: 4.23 MIL/uL (ref 3.87–5.11)
RDW: 13.8 % (ref 11.5–15.5)
WBC: 8.4 10*3/uL (ref 4.0–10.5)
nRBC: 0 % (ref 0.0–0.2)

## 2018-03-06 LAB — TYPE AND SCREEN
ABO/RH(D): O POS
Antibody Screen: NEGATIVE

## 2018-03-06 LAB — COMPREHENSIVE METABOLIC PANEL
ALBUMIN: 4 g/dL (ref 3.5–5.0)
ALT: 17 U/L (ref 0–44)
AST: 13 U/L — ABNORMAL LOW (ref 15–41)
Alkaline Phosphatase: 47 U/L (ref 38–126)
Anion gap: 7 (ref 5–15)
BUN: 7 mg/dL (ref 6–20)
CO2: 23 mmol/L (ref 22–32)
CREATININE: 0.49 mg/dL (ref 0.44–1.00)
Calcium: 8.8 mg/dL — ABNORMAL LOW (ref 8.9–10.3)
Chloride: 102 mmol/L (ref 98–111)
GFR calc Af Amer: >60 mL/min (ref >60)
GFR calc non Af Amer: >60 mL/min (ref >60)
GLUCOSE: 84 mg/dL (ref 70–99)
Potassium: 3.6 mmol/L (ref 3.5–5.1)
Sodium: 132 mmol/L — ABNORMAL LOW (ref 135–145)
Total Bilirubin: 0.3 mg/dL (ref 0.3–1.2)
Total Protein: 7.1 g/dL (ref 6.5–8.1)

## 2018-03-06 LAB — HCG, QUANTITATIVE, PREGNANCY: hCG, Beta Chain, Quant, S: 250612 m[IU]/mL — ABNORMAL HIGH (ref <5)

## 2018-03-06 SURGERY — DILATION AND EVACUATION, UTERUS
Anesthesia: General | Site: Vagina

## 2018-03-06 MED ORDER — OXYCODONE HCL 5 MG PO TABS: 5.0000 mg | ORAL_TABLET | Freq: Once | ORAL | Status: DC | PRN

## 2018-03-06 MED ORDER — PROPOFOL 10 MG/ML IV BOLUS
INTRAVENOUS | Status: DC | PRN
  Administered 2018-03-06: 200 mg via INTRAVENOUS

## 2018-03-06 MED ORDER — FENTANYL CITRATE (PF) 100 MCG/2ML IJ SOLN
INTRAMUSCULAR | Status: AC
  Filled 2018-03-06: qty 2

## 2018-03-06 MED ORDER — DOXYCYCLINE HYCLATE 100 MG IV SOLR
200.0000 mg | Freq: Once | INTRAVENOUS | Status: AC
  Administered 2018-03-06: 200 mg via INTRAVENOUS
  Filled 2018-03-06: qty 200

## 2018-03-06 MED ORDER — IBUPROFEN 600 MG PO TABS: 600.0000 mg | ORAL_TABLET | Freq: Four times a day (QID) | ORAL | 2 refills | Status: DC | PRN

## 2018-03-06 MED ORDER — METHYLERGONOVINE MALEATE 0.2 MG/ML IJ SOLN
INTRAMUSCULAR | Status: AC
  Filled 2018-03-06: qty 1

## 2018-03-06 MED ORDER — LIDOCAINE HCL (CARDIAC) PF 100 MG/5ML IV SOSY
PREFILLED_SYRINGE | INTRAVENOUS | Status: DC | PRN
  Administered 2018-03-06: 50 mg via INTRAVENOUS

## 2018-03-06 MED ORDER — LIDOCAINE HCL (CARDIAC) PF 100 MG/5ML IV SOSY
PREFILLED_SYRINGE | INTRAVENOUS | Status: AC
  Filled 2018-03-06: qty 5

## 2018-03-06 MED ORDER — FENTANYL CITRATE (PF) 100 MCG/2ML IJ SOLN
25.0000 ug | INTRAMUSCULAR | Status: DC | PRN
  Administered 2018-03-06: 25 ug via INTRAVENOUS

## 2018-03-06 MED ORDER — OXYCODONE HCL 5 MG/5ML PO SOLN: 5.0000 mg | Freq: Once | ORAL | Status: DC | PRN

## 2018-03-06 MED ORDER — MIDAZOLAM HCL 2 MG/2ML IJ SOLN
INTRAMUSCULAR | Status: AC
  Filled 2018-03-06: qty 2

## 2018-03-06 MED ORDER — METHYLERGONOVINE MALEATE 0.2 MG/ML IJ SOLN
INTRAMUSCULAR | Status: DC | PRN
  Administered 2018-03-06: 0.2 mg via INTRAMUSCULAR

## 2018-03-06 MED ORDER — LACTATED RINGERS IV SOLN
INTRAVENOUS | Status: DC
  Administered 2018-03-06: 125 mL/h via INTRAVENOUS
  Administered 2018-03-06: 16:00:00 via INTRAVENOUS

## 2018-03-06 MED ORDER — BUPIVACAINE HCL 0.5 % IJ SOLN
INTRAMUSCULAR | Status: DC | PRN
  Administered 2018-03-06: 30 mL

## 2018-03-06 MED ORDER — LIDOCAINE HCL (CARDIAC) PF 100 MG/5ML IV SOSY
PREFILLED_SYRINGE | INTRAVENOUS | Status: AC
  Filled 2018-03-06: qty 10

## 2018-03-06 MED ORDER — ONDANSETRON HCL 4 MG/2ML IJ SOLN
INTRAMUSCULAR | Status: DC | PRN
  Administered 2018-03-06: 4 mg via INTRAVENOUS

## 2018-03-06 MED ORDER — PROPOFOL 10 MG/ML IV BOLUS
INTRAVENOUS | Status: AC
  Filled 2018-03-06: qty 20

## 2018-03-06 MED ORDER — LACTATED RINGERS IV BOLUS
1000.0000 mL | Freq: Once | INTRAVENOUS | Status: AC
  Administered 2018-03-06: 1000 mL via INTRAVENOUS

## 2018-03-06 MED ORDER — ONDANSETRON HCL 4 MG/2ML IJ SOLN: 4.0000 mg | Freq: Once | INTRAMUSCULAR | Status: DC | PRN

## 2018-03-06 MED ORDER — FENTANYL CITRATE (PF) 100 MCG/2ML IJ SOLN
INTRAMUSCULAR | Status: DC | PRN
  Administered 2018-03-06 (×2): 50 ug via INTRAVENOUS

## 2018-03-06 MED ORDER — KETOROLAC TROMETHAMINE 30 MG/ML IJ SOLN
INTRAMUSCULAR | Status: DC | PRN
  Administered 2018-03-06: 30 mg via INTRAVENOUS

## 2018-03-06 MED ORDER — DOCUSATE SODIUM 100 MG PO CAPS: 100.0000 mg | ORAL_CAPSULE | Freq: Two times a day (BID) | ORAL | 2 refills | Status: DC | PRN

## 2018-03-06 MED ORDER — MIDAZOLAM HCL 5 MG/5ML IJ SOLN
INTRAMUSCULAR | Status: DC | PRN
  Administered 2018-03-06: 2 mg via INTRAVENOUS

## 2018-03-06 MED ORDER — BUPIVACAINE HCL (PF) 0.5 % IJ SOLN
INTRAMUSCULAR | Status: AC
  Filled 2018-03-06: qty 30

## 2018-03-06 MED ORDER — ONDANSETRON HCL 4 MG/2ML IJ SOLN
INTRAMUSCULAR | Status: AC
  Filled 2018-03-06: qty 2

## 2018-03-06 MED ORDER — OXYCODONE-ACETAMINOPHEN 5-325 MG PO TABS: 1.0000 | ORAL_TABLET | ORAL | 0 refills | Status: DC | PRN

## 2018-03-06 SURGICAL SUPPLY — 19 items
CATH ROBINSON RED A/P 16FR (CATHETERS) ×3 IMPLANT
DECANTER SPIKE VIAL GLASS SM (MISCELLANEOUS) ×3 IMPLANT
GLOVE BIOGEL PI IND STRL 7.0 (GLOVE) ×1 IMPLANT
GLOVE BIOGEL PI INDICATOR 7.0 (GLOVE) ×2
GLOVE ECLIPSE 7.0 STRL STRAW (GLOVE) ×3 IMPLANT
GOWN STRL REUS W/TWL LRG LVL3 (GOWN DISPOSABLE) ×6 IMPLANT
HIBICLENS CHG 4% 4OZ BTL (MISCELLANEOUS) ×3 IMPLANT
KIT BERKELEY 1ST TRIMESTER 3/8 (MISCELLANEOUS) ×3 IMPLANT
NS IRRIG 1000ML POUR BTL (IV SOLUTION) ×3 IMPLANT
PACK VAGINAL MINOR WOMEN LF (CUSTOM PROCEDURE TRAY) ×3 IMPLANT
PAD OB MATERNITY 4.3X12.25 (PERSONAL CARE ITEMS) ×3 IMPLANT
PAD PREP 24X48 CUFFED NSTRL (MISCELLANEOUS) ×3 IMPLANT
SET BERKELEY SUCTION TUBING (SUCTIONS) ×3 IMPLANT
TOWEL OR 17X24 6PK STRL BLUE (TOWEL DISPOSABLE) ×6 IMPLANT
VACURETTE 10 RIGID CVD (CANNULA) IMPLANT
VACURETTE 6 ASPIR F TIP BERK (CANNULA) IMPLANT
VACURETTE 7MM CVD STRL WRAP (CANNULA) IMPLANT
VACURETTE 8 RIGID CVD (CANNULA) IMPLANT
VACURETTE 9 RIGID CVD (CANNULA) IMPLANT

## 2018-03-06 NOTE — Anesthesia Procedure Notes (Signed)
Procedure Name: LMA Insertion Date/Time: 03/06/2018 4:27 PM Performed by: Renford DillsMullins, Lezlie Ritchey L, CRNA Pre-anesthesia Checklist: Patient identified, Patient being monitored, Emergency Drugs available, Timeout performed and Suction available Patient Re-evaluated:Patient Re-evaluated prior to induction Oxygen Delivery Method: Circle System Utilized Preoxygenation: Pre-oxygenation with 100% oxygen Induction Type: IV induction Ventilation: Mask ventilation without difficulty LMA: LMA inserted LMA Size: 4.0 Number of attempts: 1 Placement Confirmation: positive ETCO2 and breath sounds checked- equal and bilateral

## 2018-03-06 NOTE — MAU Note (Signed)
Pt sent to MAU for prep to the OR for D&E.

## 2018-03-06 NOTE — Anesthesia Preprocedure Evaluation (Signed)
Anesthesia Evaluation  Patient identified by MRN, date of birth, ID band Patient awake    Reviewed: Allergy & Precautions, NPO status , Patient's Chart, lab work & pertinent test results  History of Anesthesia Complications Negative for: history of anesthetic complications  Airway Mallampati: II  TM Distance: >3 FB Neck ROM: Full    Dental no notable dental hx. (+) Teeth Intact   Pulmonary Current Smoker,    Pulmonary exam normal        Cardiovascular hypertension, negative cardio ROS Normal cardiovascular exam     Neuro/Psych PSYCHIATRIC DISORDERS Depression Multiple sclerosis- currently asymptomatic, no meds    GI/Hepatic negative GI ROS, Neg liver ROS,   Endo/Other  negative endocrine ROS  Renal/GU negative Renal ROS  negative genitourinary   Musculoskeletal negative musculoskeletal ROS (+)   Abdominal   Peds  Hematology negative hematology ROS (+)   Anesthesia Other Findings   Reproductive/Obstetrics (+) Pregnancy (molar pregnancy)                             Anesthesia Physical Anesthesia Plan  ASA: II and emergent  Anesthesia Plan: General   Post-op Pain Management:    Induction: Intravenous  PONV Risk Score and Plan: 2 and Ondansetron, Dexamethasone, Midazolam and Treatment may vary due to age or medical condition  Airway Management Planned: LMA  Additional Equipment: None  Intra-op Plan:   Post-operative Plan: Extubation in OR  Informed Consent: I have reviewed the patients History and Physical, chart, labs and discussed the procedure including the risks, benefits and alternatives for the proposed anesthesia with the patient or authorized representative who has indicated his/her understanding and acceptance.   Dental advisory given  Plan Discussed with:   Anesthesia Plan Comments:         Anesthesia Quick Evaluation

## 2018-03-06 NOTE — Op Note (Signed)
Cheryl Bright PROCEDURE DATE:  03/06/2018  PREOPERATIVE DIAGNOSIS: Suspected molar pregnancy POSTOPERATIVE DIAGNOSIS: The same PROCEDURE:  Dilation and Evacuation under ultrasound guidance SURGEON:  Dr. Jaynie Collins  INDICATIONS: 33 y.o.  [id number] with suspected molar pregnancy needing surgical management.  Risks of surgery were discussed with the patient including but not limited to: bleeding which may require transfusion; infection which may require antibiotics; injury to uterus or surrounding organs; need for additional procedures including laparotomy or laparoscopy; possibility of intrauterine scarring which may impair future fertility; and other postoperative/anesthesia complications. Written informed consent was obtained.  FINDINGS:  Significant amount of blood, clots, products of conception.  Empty endometrial stripe noted on ultrasound at the end of the procedure.   ANESTHESIA:    General-LMA, paracervical block with 30 ml of 0.5% Marcaine ESTIMATED BLOOD LOSS:  300 ml (200 ml prior to procedure during prepping). SPECIMENS:  Products of conception sent to pathology COMPLICATIONS:  None immediate.  PROCEDURE DETAILS:  The patient received intravenous Doxycycline while in the preoperative area.  She was then taken to the operating room where anesthesia  was administered and was found to be adequate.  After an adequate timeout was performed, she was placed in the dorsal lithotomy position and examined; then prepped and draped in the sterile manner.   Her bladder was catheterized for an unmeasured amount of clear, yellow urine. A vaginal speculum was then placed in the patient's vagina and a single tooth tenaculum was applied to the anterior lip of the cervix.  A paracervical block using 30 ml of 0.5% Marcaine was administered. The cervix was gently dilated under ultrasound guidance to accommodate a 8 mm suction curette that was gently advanced to the uterine fundus.  The suction device was then  activated and curette slowly rotated to clear the uterus of products of conception.  A sharp curettage was then performed to confirm complete emptying of the uterus. There was an empty endometrial stripe noted on the ultrasound at the end of the curettage. Methergine 0.2 mg IM was administered to help prevent bleeding. There was minimal bleeding noted at the end of the procedure, and the tenaculum removed with good hemostasis noted.   All instruments were removed from the patient's vagina.  Sponge and instrument counts were correct times three.    The patient tolerated the procedure well and was taken to the recovery area awake, extubated and in stable condition.  The patient will be discharged to home as per PACU criteria.  Routine postoperative instructions given.  She was prescribed Percocet, Ibuprofen and Colace.  She will follow up in the clinic in 1 week for follow up  HCG and 2-3 weeks for postoperative evaluation and HCG check for further surveillance.    Jaynie Collins, MD, FACOG Obstetrician & Gynecologist, [facility] for [employer], [facility]

## 2018-03-06 NOTE — Progress Notes (Signed)
Faculty Practice Attending Ultrasounds Results Note  SUBJECTIVE HPI:  [name] is a 33 y.o. [id number] at [redacted]w[redacted]d by LMP who presents to the [facility] for followup ultrasound results. The patient is having some moderate vaginal bleeding.  Upon review of the patient's records, patient was first seen in MAU on 02/24/18 for heavy bleeding after positive home pregnancy test.   BHCG on that day was 48,225.  Ultrasound showed that endometrium is diffusely thickened and heterogeneous measuring 3.9 cm. No increased vascularity identified, concerning for retained products of conception. No intrauterine gestational sac, yolk sac, or fetal pole identified. She had a follow up HCG on 02/27/18 that was 79,019.   Repeat ultrasound was performed earlier today.   Past Medical History:  Diagnosis Date  . Depression   . MS (multiple sclerosis) (HCC)   . No pertinent past medical history   . Pregnancy induced hypertension   . Sickle cell trait [facility])    Past Surgical History:  Procedure Laterality Date  . CESAREAN SECTION    . FOOT SURGERY     Social History   Socioeconomic History  . Marital status: Single    Spouse name: Not on file  . Number of children: Not on file  . Years of education: Not on file  . Highest education level: Not on file  Occupational History  . Not on file  Social Needs  . Financial resource strain: Not on file  . Food insecurity:    Worry: Not on file    Inability: Not on file  . Transportation needs:    Medical: Not on file    Non-medical: Not on file  Tobacco Use  . Smoking status: Current Every Day Smoker    Packs/day: 0.25    Years: 5.00    Pack years: 1.25    Types: Cigarettes  . Smokeless tobacco: Never Used  Substance and Sexual Activity  . Alcohol use: Not Currently    Alcohol/week: 0.0 standard drinks    Comment: occ  . Drug use: Not Currently    Types: Marijuana  . Sexual activity: Yes    Partners: Male    Birth control/protection:  None  Lifestyle  . Physical activity:    Days per week: Not on file    Minutes per session: Not on file  . Stress: Not on file  Relationships  . Social connections:    Talks on phone: Not on file    Gets together: Not on file    Attends religious service: Not on file    Active member of club or organization: Not on file    Attends meetings of clubs or organizations: Not on file    Relationship status: Not on file  . Intimate partner violence:    Fear of current or ex partner: Not on file    Emotionally abused: Not on file    Physically abused: Not on file    Forced sexual activity: Not on file  Other Topics Concern  . Not on file  Social History Narrative  . Not on file   No current outpatient medications on file prior to visit.   No current facility-administered medications on file prior to visit.    Allergies  Allergen Reactions  . Sulfa Drugs Cross Reactors Hives and Swelling  . Latex Rash    I have reviewed patient's Past Medical Hx, Surgical Hx, Family Hx, Social Hx, medications and allergies.   Review of Systems Review of Systems  Constitutional:  Negative for fever and chills.  Gastrointestinal: Negative for nausea, vomiting, abdominal pain, diarrhea and constipation.  Genitourinary: Negative for dysuria.  Musculoskeletal: Negative for back pain.  Neurological: Negative for dizziness and weakness.    Physical Exam  LMP 01/04/2018   GENERAL: Well-developed, well-nourished female in no acute distress.  HEENT: Normocephalic, atraumatic.   LUNGS: Effort normal ABDOMEN: soft, non-tender HEART: Regular rate  SKIN: Warm, dry and without erythema PSYCH: Normal mood and affect NEURO: Alert and oriented x 4 PELVIC:  Moderate bleeding noted despite wearing two pads  LAB RESULTS  Results for orders placed or performed in visit on 02/27/18 (from the past 336 hour(s))  hCG, quantitative, pregnancy   Collection Time: 02/27/18  1:46 PM  Result Value Ref Range    hCG, Beta Chain, Quant, S 79,019 (H) <5 mIU/mL  Results for orders placed or performed during the hospital encounter of 02/24/18 (from the past 336 hour(s))  GC/Chlamydia probe amp ([place])not at [facility]   Collection Time: 02/24/18 12:00 AM  Result Value Ref Range   Chlamydia Negative    Neisseria gonorrhea Negative   Urinalysis, Routine w reflex microscopic   Collection Time: 02/24/18  1:19 PM  Result Value Ref Range   Color, Urine YELLOW YELLOW   APPearance HAZY (A) CLEAR   Specific Gravity, Urine 1.010 1.005 - 1.030   pH 6.0 5.0 - 8.0   Glucose, UA NEGATIVE NEGATIVE mg/dL   Hgb urine dipstick LARGE (A) NEGATIVE   Bilirubin Urine NEGATIVE NEGATIVE   Ketones, ur NEGATIVE NEGATIVE mg/dL   Protein, ur NEGATIVE NEGATIVE mg/dL   Nitrite NEGATIVE NEGATIVE   Leukocytes, UA TRACE (A) NEGATIVE   RBC / HPF 0-5 0 - 5 RBC/hpf   WBC, UA 0-5 0 - 5 WBC/hpf   Bacteria, UA RARE (A) NONE SEEN   Squamous Epithelial / LPF 11-20 0 - 5   Mucus PRESENT   Pregnancy, urine POC   Collection Time: 02/24/18  1:24 PM  Result Value Ref Range   Preg Test, Ur POSITIVE (A) NEGATIVE  Wet prep, genital   Collection Time: 02/24/18  1:47 PM  Result Value Ref Range   Yeast Wet Prep HPF POC NONE SEEN NONE SEEN   Trich, Wet Prep PRESENT (A) NONE SEEN   Clue Cells Wet Prep HPF POC PRESENT (A) NONE SEEN   WBC, Wet Prep HPF POC MANY (A) NONE SEEN   Sperm NONE SEEN   CBC   Collection Time: 02/24/18  1:52 PM  Result Value Ref Range   WBC 7.5 4.0 - 10.5 K/uL   RBC 4.33 3.87 - 5.11 MIL/uL   Hemoglobin 13.4 12.0 - 15.0 g/dL   HCT 75.3 00.5 - 11.0 %   MCV 89.4 80.0 - 100.0 fL   MCH 30.9 26.0 - 34.0 pg   MCHC 34.6 30.0 - 36.0 g/dL   RDW 21.1 17.3 - 56.7 %   Platelets 293 150 - 400 K/uL   nRBC 0.0 0.0 - 0.2 %  hCG, quantitative, pregnancy   Collection Time: 02/24/18  1:52 PM  Result Value Ref Range   hCG, Beta Chain, Quant, S 48,225 (H) <5 mIU/mL    IMAGING US Ob Transvaginal  Result Date:  03/06/2018 CLINICAL DATA:  Pregnancy of unknown location. EXAM: OBSTETRIC <14 WK ULTRASOUND TECHNIQUE: Transabdominal ultrasound was performed for evaluation of the gestation as well as the maternal uterus and adnexal regions. COMPARISON:  None. FINDINGS: Intrauterine gestational sac: None Yolk sac:  Not Visualized. Embryo:  Not  Visualized. Cardiac Activity: Not Visualized. Maternal uterus/adnexae: Normal right and left ovaries. No adnexal mass. No free fluid. The endometrium is distended and contains mixed solid and cystic material. IMPRESSION: Mixed solid and cystic material within the endometrium raising the possibility of gestational trophoblastic disease/molar pregnancy. No live intrauterine gestation identified. These results will be called to the ordering clinician or representative by the Radiologist Assistant, and communication documented in the PACS or zVision Dashboard. Electronically Signed   By: Drew  Davis M.D.   On: 03/06/2018 13:25   Us Ob Less Than 14 Weeks With Ob Transvaginal  Result Date: 02/24/2018 CLINICAL DATA:  Vaginal bleeding during pregnancy. EXAM: OBSTETRIC <14 WK US AND TRANSVAGINAL OB US TECHNIQUE: Both transabdominal and transvaginal ultrasound examinations were performed for complete evaluation of the gestation as well as the maternal uterus, adnexal regions, and pelvic cul-de-sac. Transvaginal technique was performed to assess early pregnancy. COMPARISON:  None. FINDINGS: Intrauterine gestational sac: None Yolk sac:  Not Visualized. Embryo:  Not Visualized. Subchorionic hemorrhage:  None visualized. Maternal uterus/adnexae: Right ovary: Normal Left ovary: Normal Other :The endometrium is diffusely thickened and heterogeneous measuring 3.9 cm. No increased vascularity identified. Free fluid:  Trace free fluid noted. IMPRESSION: 1. No intrauterine gestational sac, yolk sac, or fetal pole identified. Differential considerations include intrauterine pregnancy too early to be  sonographically visualized, missed abortion, or ectopic pregnancy. Followup ultrasound is recommended in 10-14 days for further evaluation. 2. Abnormal heterogeneous appearance of the endometrium measuring 3.9 cm. In the setting of spontaneous abortion findings are suspicious for retained products of conception. Electronically Signed   By: Taylor  Stroud M.D.   On: 02/24/2018 14:36    ASSESSMENT 1. Suspected Molar pregnancy     PLAN Patient with suspected molar pregnancy, having moderate to heavy bleeding right now.  Has been NPO since 0800, had hash browns, no protein.  Drank tea with lemon.  Patient was counseled regarding need for Dilation and Evacuation under Ultrasound Guidance.  Risks of surgery including bleeding, infection, injury to surrounding organs, need for additional procedures, possibility of intrauterine scarring which may impair future fertility, risk of retained products which may require further management and other postoperative/anesthesia complications were explained to patient.  Likelihood of success of complete evacuation of the uterus was discussed with the patient.  Written informed consent was obtained.  Anesthesia and OR aware.  Preoperative prophylactic Doxycycline 200mg IV  has been ordered and is on call to the OR. Will take to MAU for preoperative preparation.  To OR when ready.   Patient was counseled about need for close surveillance if this is confirmed as a molar pregnancy.  She will need weekly labs for hCG levels until three consecutive negative  values are obtained.  A plateau or rise in hCG may be indicative of the development of persistent trophoblastic disease, and will necessitate further evaluation and treatment. After three weeks of negative values, patients are followed with monthly hCG levels for a total of six months. Patient will be strongly encouraged to be on contraception during this entire surveillance period.  A new pregnancy during this period would  make it difficult or impossible to interpret hCG results and would complicate management.    Maidie Streight A, MD  03/06/2018  2:37 PM   

## 2018-03-06 NOTE — Anesthesia Postprocedure Evaluation (Signed)
Anesthesia Post Note  Patient: Cheryl Bright  Procedure(s) Performed: DILATATION AND EVACUATION (N/A Vagina )     Patient location during evaluation: PACU Anesthesia Type: General Level of consciousness: awake and alert Pain management: pain level controlled Vital Signs Assessment: post-procedure vital signs reviewed and stable Respiratory status: spontaneous breathing, nonlabored ventilation, respiratory function stable and patient connected to nasal cannula oxygen Cardiovascular status: blood pressure returned to baseline and stable Postop Assessment: no apparent nausea or vomiting Anesthetic complications: no    Last Vitals:  Vitals:   03/06/18 1715 03/06/18 1730  BP: (!) 155/96 (!) 159/98  Pulse: 78 73  Resp: (!) 21 (!) 21  Temp:    SpO2: 100% 100%    Last Pain:  Vitals:   03/06/18 1715  PainSc: 0-No pain   Pain Goal:                 Lucretia Kern

## 2018-03-06 NOTE — Interval H&P Note (Signed)
History and Physical Interval Note 03/06/2018 3:12 PM  Cheryl Bright  has presented today for surgery, with the diagnosis of Suspected Molar Pregnancy  The various methods of treatment have been discussed with the patient and family. After consideration of risks, benefits and other options for treatment, the patient has consented to  Procedure(s): DILATATION AND EVACUATION under ultrasound guidance as a surgical intervention.  The patient's history has been reviewed, patient examined, no change in status, vitals stable; she is stable for surgery. Expected to undergo surgery around 1600.  I have reviewed the patient's chart and labs.  Questions were answered to the patient's satisfaction.  To OR when ready.   Jaynie Collins, MD, FACOG Obstetrician & Gynecologist, [facility] for [employer], [facility]

## 2018-03-06 NOTE — Discharge Instructions (Signed)
Post Anesthesia Home Care Instructions  Activity: Get plenty of rest for the remainder of the day. A responsible individual must stay with you for 24 hours following the procedure.  For the next 24 hours, DO NOT: -Drive a car -Advertising copywriter -Drink alcoholic beverages -Take any medication unless instructed by your physician -Make any legal decisions or sign important papers.  Meals: Start with liquid foods such as gelatin or soup. Progress to regular foods as tolerated. Avoid greasy, spicy, heavy foods. If nausea and/or vomiting occur, drink only clear liquids until the nausea and/or vomiting subsides. Call your physician if vomiting continues.  Special Instructions/Symptoms: Your throat may feel dry or sore from the anesthesia or the breathing tube placed in your throat during surgery. If this causes discomfort, gargle with warm salt water. The discomfort should disappear within 24 hours.  If you had a scopolamine patch placed behind your ear for the management of post- operative nausea and/or vomiting:  1. The medication in the patch is effective for 72 hours, after which it should be removed.  Wrap patch in a tissue and discard in the trash. Wash hands thoroughly with soap and water. 2. You may remove the patch earlier than 72 hours if you experience unpleasant side effects which may include dry mouth, dizziness or visual disturbances. 3. Avoid touching the patch. Wash your hands with soap and water after contact with the patch.     Dilation and Curettage or Vacuum Curettage, Care After These instructions give you information about caring for yourself after your procedure. Your doctor may also give you more specific instructions. Call your doctor if you have any problems or questions after your procedure. Follow these instructions at home: Activity  Do not drive or use heavy machinery while taking prescription pain medicine.  For 24 hours after your procedure, avoid  driving.  Take short walks often, followed by rest periods. Ask your doctor what activities are safe for you. After one or two days, you may be able to return to your normal activities.  Do not lift anything that is heavier than 10 lb (4.5 kg) until your doctor approves.  For at least 2 weeks, or as long as told by your doctor: ? Do not douche. ? Do not use tampons. ? Do not have sex. General instructions   Take over-the-counter and prescription medicines only as told by your doctor. This is very important if you take blood thinning medicine.  Do not take baths, swim, or use a hot tub until your doctor approves. Take showers instead of baths.  Wear compression stockings as told by your doctor.  It is up to you to get the results of your procedure. Ask your doctor when your results will be ready.  Keep all follow-up visits as told by your doctor. This is important. Contact a doctor if:  You have very bad cramps that get worse or do not get better with medicine.  You have very bad pain in your belly (abdomen).  You cannot drink fluids without throwing up (vomiting).  You get pain in a different part of the area between your belly and thighs (pelvis).  You have bad-smelling discharge from your vagina.  You have a rash. Get help right away if:  You are bleeding a lot from your vagina. A lot of bleeding means soaking more than one sanitary pad in an hour, for 2 hours in a row.  You have clumps of blood (blood clots) coming from your vagina.  You have a fever or chills.  Your belly feels very tender or hard.  You have chest pain.  You have trouble breathing.  You cough up blood.  You feel dizzy.  You feel light-headed.  You pass out (faint).  You have pain in your neck or shoulder area. Summary  Take short walks often, followed by rest periods. Ask your doctor what activities are safe for you. After one or two days, you may be able to return to your normal  activities.  Do not lift anything that is heavier than 10 lb (4.5 kg) until your doctor approves.  Do not take baths, swim, or use a hot tub until your doctor approves. Take showers instead of baths.  Contact your doctor if you have any symptoms of infection, like bad-smelling discharge from your vagina. This information is not intended to replace advice given to you by your health care provider. Make sure you discuss any questions you have with your health care provider. Document Released: 11/29/2007 Document Revised: 11/07/2015 Document Reviewed: 11/07/2015 Elsevier Interactive Patient Education  2019 Else[employer] Inc.  Molar Pregnancy  A molar pregnancy (hydatidiform mole) is a mass of tissue that grows in the uterus after an egg is fertilized incorrectly. The mass does not develop into a fetus, and is considered an abnormal pregnancy. Usually, the pregnancy ends on its own through miscarriage. In some cases, treatment may be required. What are the causes? This condition is caused by an egg that is fertilized incorrectly so that it has abnormal genetic material (chromosomes). This can result in one of two types of molar pregnancies:  Complete molar pregnancy. This is when all of the chromosomes in the fertilized egg are from the father, and none are from the mother.  Partial molar pregnancy. This is when the fertilized egg has chromosomes from the father and mother, but it has too many chromosomes. What increases the risk? This condition is more likely to develop in:  Women who are over the age of 67 or under the age of 40.  Women who have had a molar pregnancy in the past (very rare). Other possible risk factors include:  Smoking more than 15 cigarettes a day.  History of infertility.  Having a blood type A, B, or AB.  Having a lack (deficiency) of vitamin A.  Using birth control pills (oral contraceptives). What are the signs or symptoms? Symptoms of this condition  include:  Vaginal bleeding.  Missed menstrual period.  The uterus growing faster than expected for a normal pregnancy.  Severe nausea and vomiting.  Severe pressure or pain in the uterus.  Abnormal ovarian cysts (theca lutein cysts).  Vaginal discharge that looks like grapes.  High blood pressure (early onset of preeclampsia).  Overactive thyroid gland (hyperthyroidism).  Not having enough red blood cells or hemoglobin (anemia). How is this diagnosed? This condition is diagnosed based on ultrasound and blood tests. How is this treated? Usually, molar pregnancies end on their own by miscarriage. A health care provider may manage this condition by:  Monitoring the levels of pregnancy hormones in your blood to make sure that the hormone levels are decreasing as expected.  Giving you a medicine called Rho (D) immune globulin. This medicine helps to prevent problems that may occur in future pregnancies as a result of a protein on red blood cells (Rh factor). You may be given this medicine if you do not have an Rh factor (you are Rh negative) and your sex partner has an  Rh factor (he is Rh positive).  Putting you on chemotherapy. This involves taking medicines that regulate levels of pregnancy hormones. This may be done if your pregnancy hormone levels are not decreasing as expected.  Performing a procedure called dilation and curettage (D&C), or vacuum curettage. These are minor procedures that involve scraping or suctioning the molar pregnancy out of the uterus and removing it through the vagina. Even if a molar pregnancy ends on its own, one of these procedures may be done to make sure that all the abnormal tissue is out of the uterus.  Doing a surgical removal of the uterus (hysterectomy). Follow these instructions at home:  Avoid getting pregnant for 6-12 months, or as long as told by your health care provider. To avoid getting pregnant, avoid having sex or use a reliable form of  birth control every time you have sex.  Take over-the-counter and prescription medicines only as told by your health care provider.  Rest as needed, and slowly return to your normal activities.  Think about joining a support group. If you are struggling with grief, ask your health care provider for help.  Keep all follow-up visits as told by your health care provider. This is important. You may need follow-up blood tests or ultrasounds. Contact a health care provider if:  You continue to have irregular vaginal bleeding.  You have abdominal pain. Summary  A molar pregnancy (hydatidiform mole) is a mass of tissue that grows in the uterus after an egg is fertilized incorrectly.  This condition is more likely to develop in women who are over the age of 26 or under the age of 11 or women who have had a molar pregnancy in the past.  The most common symptom of this condition is vaginal bleeding.  Usually, molar pregnancy ends with a miscarriage, and no treatment is needed. This information is not intended to replace advice given to you by your health care provider. Make sure you discuss any questions you have with your health care provider. Document Released: 11/07/2010 Document Revised: 04/25/2016 Document Reviewed: 04/25/2016 Elsevier Interactive Patient Education  2019 [employer].

## 2018-03-06 NOTE — H&P (View-Only) (Signed)
Faculty Practice Attending Ultrasounds Results Note  SUBJECTIVE HPI:  Cheryl Bright is a 33 y.o. [id number] at [redacted]w[redacted]d by LMP who presents to the [facility] for followup ultrasound results. The patient is having some moderate vaginal bleeding.  Upon review of the patient's records, patient was first seen in [facility] on 02/24/18 for heavy bleeding after positive home pregnancy test.   BHCG on that day was 48,225.  Ultrasound showed that endometrium is diffusely thickened and heterogeneous measuring 3.9 cm. No increased vascularity identified, concerning for retained products of conception. No intrauterine gestational sac, yolk sac, or fetal pole identified. She had a follow up HCG on 02/27/18 that was 79,019.   Repeat ultrasound was performed earlier today.   Past Medical History:  Diagnosis Date  . Depression   . MS (multiple sclerosis) (HCC)   . No pertinent past medical history   . Pregnancy induced hypertension   . Sickle cell trait [facility])    Past Surgical History:  Procedure Laterality Date  . CESAREAN SECTION    . FOOT SURGERY     Social History   Socioeconomic History  . Marital status: Single    Spouse name: Not on file  . Number of children: Not on file  . Years of education: Not on file  . Highest education level: Not on file  Occupational History  . Not on file  Social Needs  . Financial resource strain: Not on file  . Food insecurity:    Worry: Not on file    Inability: Not on file  . Transportation needs:    Medical: Not on file    Non-medical: Not on file  Tobacco Use  . Smoking status: Current Every Day Smoker    Packs/day: 0.25    Years: 5.00    Pack years: 1.25    Types: Cigarettes  . Smokeless tobacco: Never Used  Substance and Sexual Activity  . Alcohol use: Not Currently    Alcohol/week: 0.0 standard drinks    Comment: occ  . Drug use: Not Currently    Types: Marijuana  . Sexual activity: Yes    Partners: Male    Birth control/protection:  None  Lifestyle  . Physical activity:    Days per week: Not on file    Minutes per session: Not on file  . Stress: Not on file  Relationships  . Social connections:    Talks on phone: Not on file    Gets together: Not on file    Attends religious service: Not on file    Active member of club or organization: Not on file    Attends meetings of clubs or organizations: Not on file    Relationship status: Not on file  . Intimate partner violence:    Fear of current or ex partner: Not on file    Emotionally abused: Not on file    Physically abused: Not on file    Forced sexual activity: Not on file  Other Topics Concern  . Not on file  Social History Narrative  . Not on file   No current outpatient medications on file prior to visit.   No current facility-administered medications on file prior to visit.    Allergies  Allergen Reactions  . Sulfa Drugs Cross Reactors Hives and Swelling  . Latex Rash    I have reviewed patient's Past Medical Hx, Surgical Hx, Family Hx, Social Hx, medications and allergies.   Review of Systems Review of Systems  Constitutional:  Negative for fever and chills.  Gastrointestinal: Negative for nausea, vomiting, abdominal pain, diarrhea and constipation.  Genitourinary: Negative for dysuria.  Musculoskeletal: Negative for back pain.  Neurological: Negative for dizziness and weakness.    Physical Exam  LMP 01/04/2018   GENERAL: Well-developed, well-nourished female in no acute distress.  HEENT: Normocephalic, atraumatic.   LUNGS: Effort normal ABDOMEN: soft, non-tender HEART: Regular rate  SKIN: Warm, dry and without erythema PSYCH: Normal mood and affect NEURO: Alert and oriented x 4 PELVIC:  Moderate bleeding noted despite wearing two pads  LAB RESULTS  Results for orders placed or performed in visit on 02/27/18 (from the past 336 hour(s))  hCG, quantitative, pregnancy   Collection Time: 02/27/18  1:46 PM  Result Value Ref Range    hCG, Beta Chain, Quant, S 79,019 (H) <5 mIU/mL  Results for orders placed or performed during the hospital encounter of 02/24/18 (from the past 336 hour(s))  GC/Chlamydia probe amp ([place])not at [facility]   Collection Time: 02/24/18 12:00 AM  Result Value Ref Range   Chlamydia Negative    Neisseria gonorrhea Negative   Urinalysis, Routine w reflex microscopic   Collection Time: 02/24/18  1:19 PM  Result Value Ref Range   Color, Urine YELLOW YELLOW   APPearance HAZY (A) CLEAR   Specific Gravity, Urine 1.010 1.005 - 1.030   pH 6.0 5.0 - 8.0   Glucose, UA NEGATIVE NEGATIVE mg/dL   Hgb urine dipstick LARGE (A) NEGATIVE   Bilirubin Urine NEGATIVE NEGATIVE   Ketones, ur NEGATIVE NEGATIVE mg/dL   Protein, ur NEGATIVE NEGATIVE mg/dL   Nitrite NEGATIVE NEGATIVE   Leukocytes, UA TRACE (A) NEGATIVE   RBC / HPF 0-5 0 - 5 RBC/hpf   WBC, UA 0-5 0 - 5 WBC/hpf   Bacteria, UA RARE (A) NONE SEEN   Squamous Epithelial / LPF 11-20 0 - 5   Mucus PRESENT   Pregnancy, urine POC   Collection Time: 02/24/18  1:24 PM  Result Value Ref Range   Preg Test, Ur POSITIVE (A) NEGATIVE  Wet prep, genital   Collection Time: 02/24/18  1:47 PM  Result Value Ref Range   Yeast Wet Prep HPF POC NONE SEEN NONE SEEN   Trich, Wet Prep PRESENT (A) NONE SEEN   Clue Cells Wet Prep HPF POC PRESENT (A) NONE SEEN   WBC, Wet Prep HPF POC MANY (A) NONE SEEN   Sperm NONE SEEN   CBC   Collection Time: 02/24/18  1:52 PM  Result Value Ref Range   WBC 7.5 4.0 - 10.5 K/uL   RBC 4.33 3.87 - 5.11 MIL/uL   Hemoglobin 13.4 12.0 - 15.0 g/dL   HCT 75.3 00.5 - 11.0 %   MCV 89.4 80.0 - 100.0 fL   MCH 30.9 26.0 - 34.0 pg   MCHC 34.6 30.0 - 36.0 g/dL   RDW 21.1 17.3 - 56.7 %   Platelets 293 150 - 400 K/uL   nRBC 0.0 0.0 - 0.2 %  hCG, quantitative, pregnancy   Collection Time: 02/24/18  1:52 PM  Result Value Ref Range   hCG, Beta Chain, Quant, S 48,225 (H) <5 mIU/mL    IMAGING US Ob Transvaginal  Result Date:  03/06/2018 CLINICAL DATA:  Pregnancy of unknown location. EXAM: OBSTETRIC <14 WK ULTRASOUND TECHNIQUE: Transabdominal ultrasound was performed for evaluation of the gestation as well as the maternal uterus and adnexal regions. COMPARISON:  None. FINDINGS: Intrauterine gestational sac: None Yolk sac:  Not Visualized. Embryo:  Not  Visualized. Cardiac Activity: Not Visualized. Maternal uterus/adnexae: Normal right and left ovaries. No adnexal mass. No free fluid. The endometrium is distended and contains mixed solid and cystic material. IMPRESSION: Mixed solid and cystic material within the endometrium raising the possibility of gestational trophoblastic disease/molar pregnancy. No live intrauterine gestation identified. These results will be called to the ordering clinician or representative by the Radiologist Assistant, and communication documented in the PACS or zVision Dashboard. Electronically Signed   By: Annia Belt M.D.   On: 03/06/2018 13:25   US Ob Less Than 14 Weeks With Ob Transvaginal  Result Date: 02/24/2018 CLINICAL DATA:  Vaginal bleeding during pregnancy. EXAM: OBSTETRIC <14 WK [place] AND TRANSVAGINAL OB US TECHNIQUE: Both transabdominal and transvaginal ultrasound examinations were performed for complete evaluation of the gestation as well as the maternal uterus, adnexal regions, and pelvic cul-de-sac. Transvaginal technique was performed to assess early pregnancy. COMPARISON:  None. FINDINGS: Intrauterine gestational sac: None Yolk sac:  Not Visualized. Embryo:  Not Visualized. Subchorionic hemorrhage:  None visualized. Maternal uterus/adnexae: Right ovary: Normal Left ovary: Normal Other :The endometrium is diffusely thickened and heterogeneous measuring 3.9 cm. No increased vascularity identified. Free fluid:  Trace free fluid noted. IMPRESSION: 1. No intrauterine gestational sac, yolk sac, or fetal pole identified. Differential considerations include intrauterine pregnancy too early to be  sonographically visualized, missed abortion, or ectopic pregnancy. Followup ultrasound is recommended in 10-14 days for further evaluation. 2. Abnormal heterogeneous appearance of the endometrium measuring 3.9 cm. In the setting of spontaneous abortion findings are suspicious for retained products of conception. Electronically Signed   By: Signa Kell M.D.   On: 02/24/2018 14:36    ASSESSMENT 1. Suspected Molar pregnancy     PLAN Patient with suspected molar pregnancy, having moderate to heavy bleeding right now.  Has been NPO since 0800, had hash browns, no protein.  Drank tea with lemon.  Patient was counseled regarding need for Dilation and Evacuation under Ultrasound Guidance.  Risks of surgery including bleeding, infection, injury to surrounding organs, need for additional procedures, possibility of intrauterine scarring which may impair future fertility, risk of retained products which may require further management and other postoperative/anesthesia complications were explained to patient.  Likelihood of success of complete evacuation of the uterus was discussed with the patient.  Written informed consent was obtained.  Anesthesia and OR aware.  Preoperative prophylactic Doxycycline 200mg  IV  has been ordered and is on call to the OR. Will take to [facility] for preoperative preparation.  To OR when ready.   Patient was counseled about need for close surveillance if this is confirmed as a molar pregnancy.  She will need weekly labs for hCG levels until three consecutive negative  values are obtained.  A plateau or rise in hCG may be indicative of the development of persistent trophoblastic disease, and will necessitate further evaluation and treatment. After three weeks of negative values, patients are followed with monthly hCG levels for a total of six months. Patient will be strongly encouraged to be on contraception during this entire surveillance period.  A new pregnancy during this period would  make it difficult or impossible to interpret hCG results and would complicate management.    Tereso Newcomer, MD  03/06/2018  2:37 PM

## 2018-03-06 NOTE — Patient Instructions (Signed)
Molar Pregnancy  A molar pregnancy (hydatidiform mole) is a mass of tissue that grows in the uterus after an egg is fertilized incorrectly. The mass does not develop into a fetus, and is considered an abnormal pregnancy. Usually, the pregnancy ends on its own through miscarriage. In some cases, treatment may be required. What are the causes? This condition is caused by an egg that is fertilized incorrectly so that it has abnormal genetic material (chromosomes). This can result in one of two types of molar pregnancies:  Complete molar pregnancy. This is when all of the chromosomes in the fertilized egg are from the father, and none are from the mother.  Partial molar pregnancy. This is when the fertilized egg has chromosomes from the father and mother, but it has too many chromosomes. What increases the risk? This condition is more likely to develop in:  Women who are over the age of 57 or under the age of 42.  Women who have had a molar pregnancy in the past (very rare). Other possible risk factors include:  Smoking more than 15 cigarettes a day.  History of infertility.  Having a blood type A, B, or AB.  Having a lack (deficiency) of vitamin A.  Using birth control pills (oral contraceptives). What are the signs or symptoms? Symptoms of this condition include:  Vaginal bleeding.  Missed menstrual period.  The uterus growing faster than expected for a normal pregnancy.  Severe nausea and vomiting.  Severe pressure or pain in the uterus.  Abnormal ovarian cysts (theca lutein cysts).  Vaginal discharge that looks like grapes.  High blood pressure (early onset of preeclampsia).  Overactive thyroid gland (hyperthyroidism).  Not having enough red blood cells or hemoglobin (anemia). How is this diagnosed? This condition is diagnosed based on ultrasound and blood tests. How is this treated? Usually, molar pregnancies end on their own by miscarriage. A health care provider  may manage this condition by:  Monitoring the levels of pregnancy hormones in your blood to make sure that the hormone levels are decreasing as expected.  Giving you a medicine called Rho (D) immune globulin. This medicine helps to prevent problems that may occur in future pregnancies as a result of a protein on red blood cells (Rh factor). You may be given this medicine if you do not have an Rh factor (you are Rh negative) and your sex partner has an Rh factor (he is Rh positive).  Putting you on chemotherapy. This involves taking medicines that regulate levels of pregnancy hormones. This may be done if your pregnancy hormone levels are not decreasing as expected.  Performing a procedure called dilation and curettage (D&C), or vacuum curettage. These are minor procedures that involve scraping or suctioning the molar pregnancy out of the uterus and removing it through the vagina. Even if a molar pregnancy ends on its own, one of these procedures may be done to make sure that all the abnormal tissue is out of the uterus.  Doing a surgical removal of the uterus (hysterectomy). Follow these instructions at home:  Avoid getting pregnant for 6-12 months, or as long as told by your health care provider. To avoid getting pregnant, avoid having sex or use a reliable form of birth control every time you have sex.  Take over-the-counter and prescription medicines only as told by your health care provider.  Rest as needed, and slowly return to your normal activities.  Think about joining a support group. If you are struggling with grief, ask  Take over-the-counter and prescription medicines only as told by your health care provider.   Rest as needed, and slowly return to your normal activities.   Think about joining a support group. If you are struggling with grief, ask your health care provider for help.   Keep all follow-up visits as told by your health care provider. This is important. You may need follow-up blood tests or ultrasounds.  Contact a health care provider if:   You continue to have irregular vaginal bleeding.   You have abdominal pain.  Summary   A molar pregnancy (hydatidiform mole) is a mass of tissue that grows in  the uterus after an egg is fertilized incorrectly.   This condition is more likely to develop in women who are over the age of 35 or under the age of 20 or women who have had a molar pregnancy in the past.   The most common symptom of this condition is vaginal bleeding.   Usually, molar pregnancy ends with a miscarriage, and no treatment is needed.  This information is not intended to replace advice given to you by your health care provider. Make sure you discuss any questions you have with your health care provider.  Document Released: 11/07/2010 Document Revised: 04/25/2016 Document Reviewed: 04/25/2016  Elsevier Interactive Patient Education  2019 [employer] Inc[employer]

## 2018-03-06 NOTE — [facility] Provider Note (Signed)
Please refer to preoperative notes/H & P for operative details.  Jaynie Collins, MD, FACOG Obstetrician & Gynecologist, [facility] for [employer], [facility]

## 2018-03-06 NOTE — Transfer of Care (Signed)
Immediate Anesthesia Transfer of Care Note  Patient: Cheryl Bright  Procedure(s) Performed: DILATATION AND EVACUATION (N/A Vagina )  Patient Location: PACU  Anesthesia Type:General  Level of Consciousness: sedated  Airway & Oxygen Therapy: Patient Spontanous Breathing and Patient connected to nasal cannula oxygen  Post-op Assessment: Report given to RN and Post -op Vital signs reviewed and stable  Post vital signs: stable  Last Vitals:  Vitals Value Taken Time  BP    Temp    Pulse    Resp    SpO2      Last Pain:  Vitals:   03/06/18 1509  PainSc: 5          Complications: No apparent anesthesia complications

## 2018-03-06 NOTE — MAU Note (Addendum)
Spoke with Dr Lamont Snowball, they would like to wait until 4 p.m. to start since the pt ate at 0800, no pre-op meds desired.,

## 2018-03-07 ENCOUNTER — Ambulatory Visit: Payer: Self-pay | Admitting: Obstetrics & Gynecology

## 2018-03-07 ENCOUNTER — Encounter (HOSPITAL_COMMUNITY): Payer: Self-pay | Admitting: Obstetrics & Gynecology

## 2018-03-11 ENCOUNTER — Other Ambulatory Visit: Payer: Self-pay | Admitting: *Deleted

## 2018-03-11 DIAGNOSIS — O09A1 Supervision of pregnancy with history of molar pregnancy, first trimester: Secondary | ICD-10-CM

## 2018-03-11 DIAGNOSIS — O020 Blighted ovum and nonhydatidiform mole: Secondary | ICD-10-CM

## 2018-03-11 HISTORY — DX: Supervision of pregnancy with history of molar pregnancy, first trimester: O09.A1

## 2018-03-13 ENCOUNTER — Other Ambulatory Visit: Payer: Self-pay

## 2018-03-13 DIAGNOSIS — O020 Blighted ovum and nonhydatidiform mole: Secondary | ICD-10-CM

## 2018-03-14 LAB — BETA HCG QUANT (REF LAB): hCG Quant: 1184 m[IU]/mL

## 2018-03-19 ENCOUNTER — Other Ambulatory Visit: Payer: Self-pay | Admitting: *Deleted

## 2018-03-19 DIAGNOSIS — O020 Blighted ovum and nonhydatidiform mole: Secondary | ICD-10-CM

## 2018-03-20 ENCOUNTER — Other Ambulatory Visit: Payer: Self-pay

## 2018-03-20 ENCOUNTER — Ambulatory Visit: Payer: Self-pay | Admitting: Internal Medicine

## 2018-03-27 ENCOUNTER — Other Ambulatory Visit: Payer: Self-pay

## 2018-04-03 ENCOUNTER — Other Ambulatory Visit: Payer: Self-pay

## 2018-10-13 ENCOUNTER — Encounter: Payer: Self-pay | Admitting: *Deleted

## 2018-10-13 IMAGING — CT CT HEAD W/O CM
3 series · 14 of 47 positions shown, 16 images · non-contrast
Comparison: None.

CLINICAL DATA: Headache for 6 days.  Hypertension.

EXAM:
CT HEAD WITHOUT CONTRAST
TECHNIQUE: Contiguous axial images were obtained from the base of the skull
through the vertex without intravenous contrast.

[Series 2: head wo · axial · 0.39mm/px · z∈[-159,-34]mm · 8 of 30 slices shown, 10 images]
[im 3/30  brain]
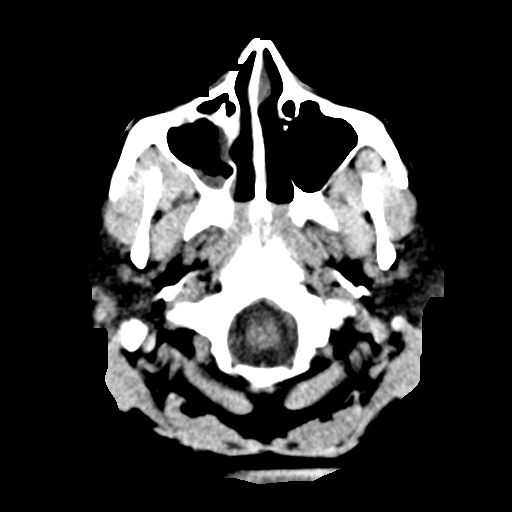
[im 3/30  bone]
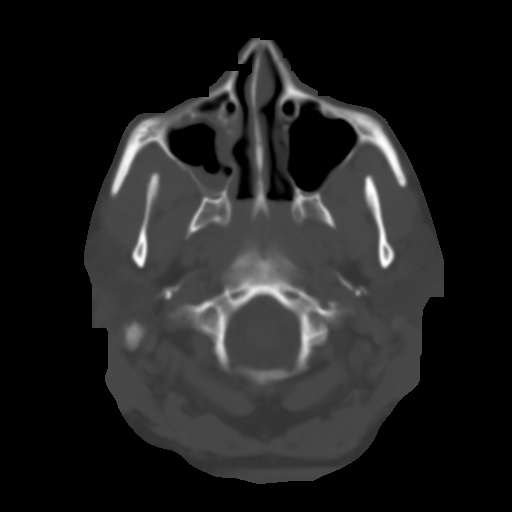
[im 7/30  brain]
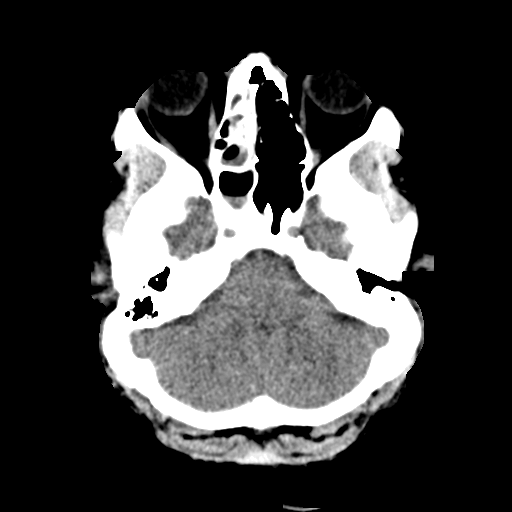
[im 10/30  brain]
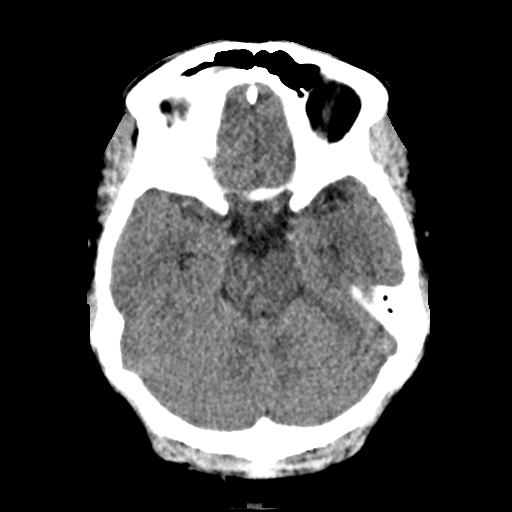
[im 14/30  brain]
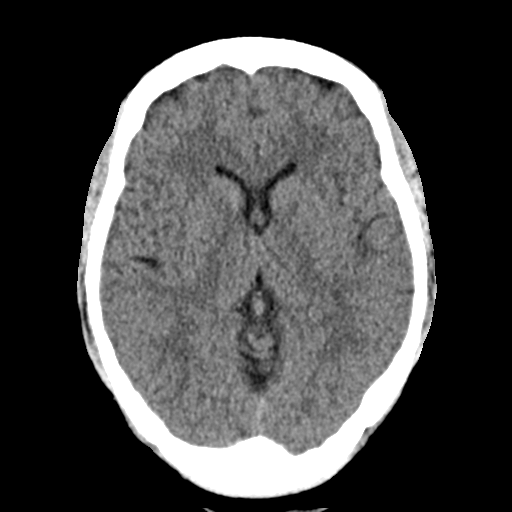
[im 17/30  brain]
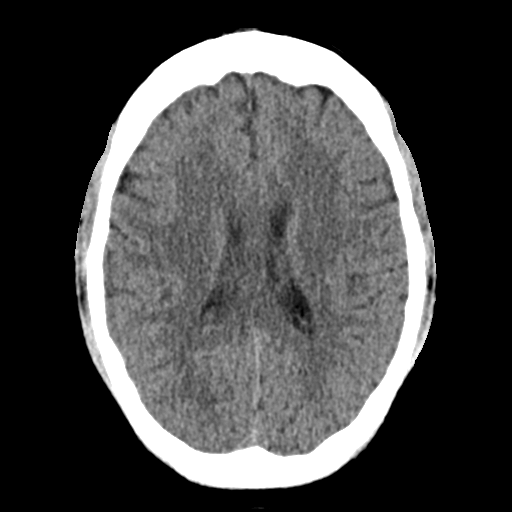
[im 17/30  bone]
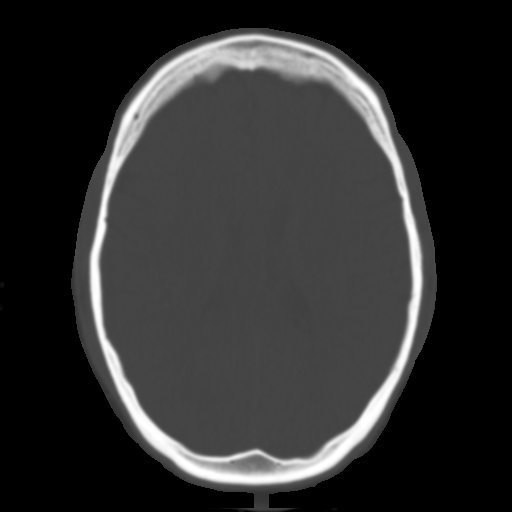
[im 21/30  brain]
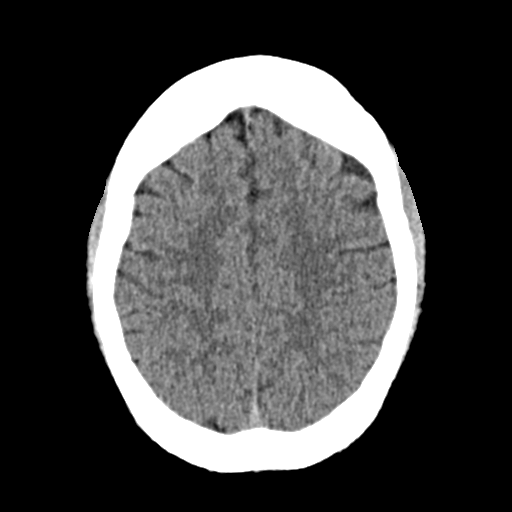
[im 24/30  brain]
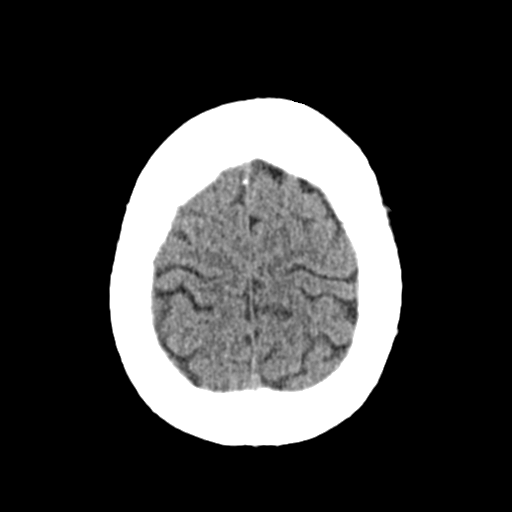
[im 28/30  brain]
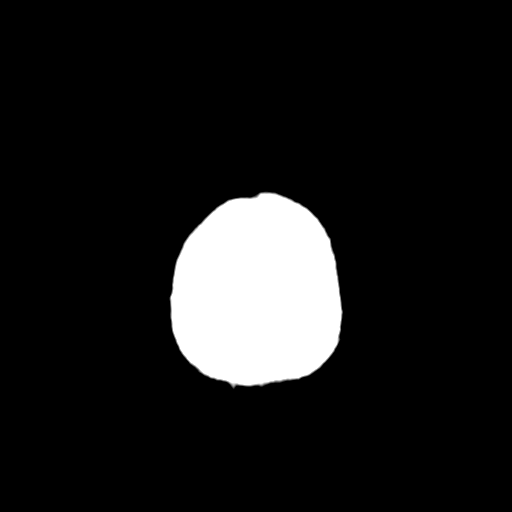

[Series 4: coronal soft · coronal · 0.30mm/px · 3 of 62 slices shown]
[im 21/62  brain]
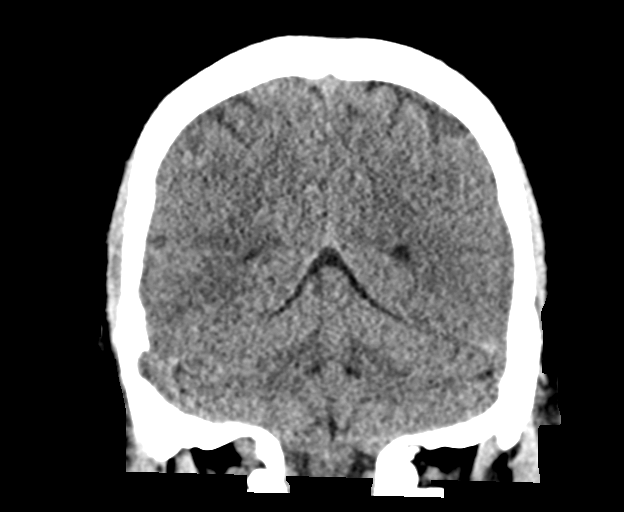
[im 28/62  brain]
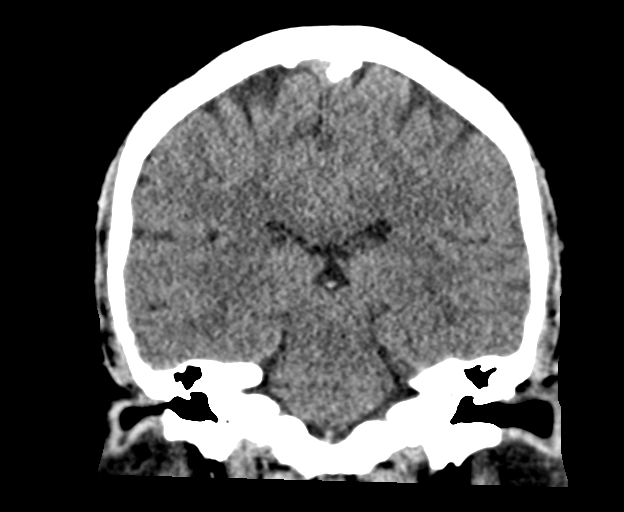
[im 34/62  brain]
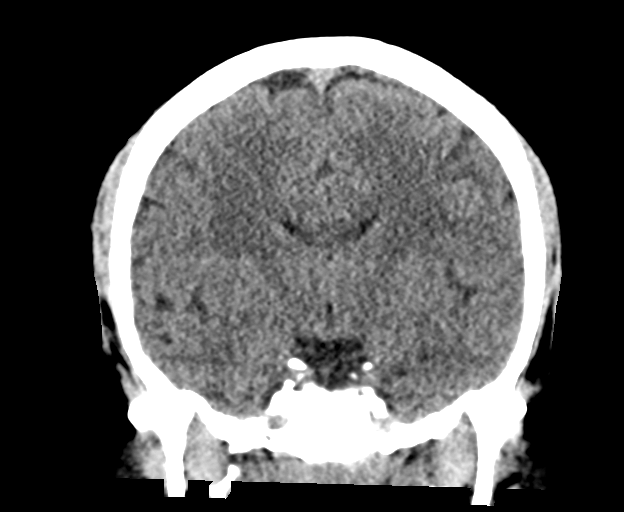

[Series 5: sag soft · sagittal · 0.31mm/px · 3 of 54 slices shown]
[im 18/54  brain]
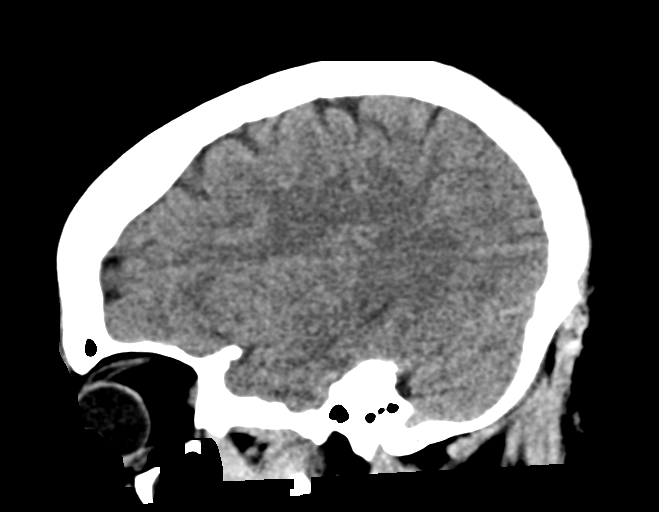
[im 27/54  brain]
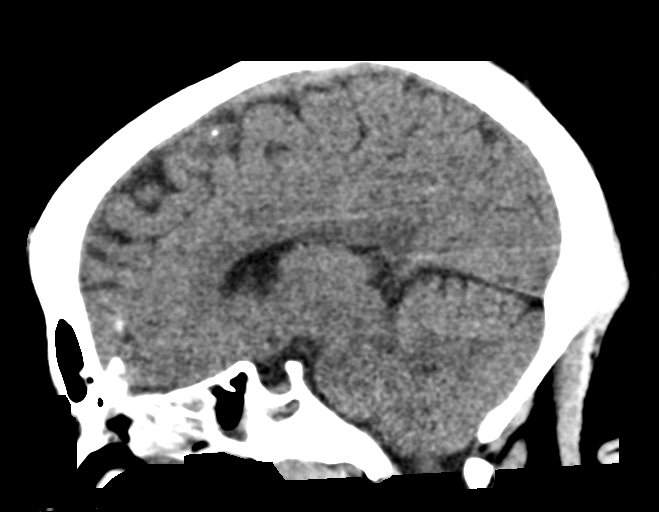
[im 36/54  brain]
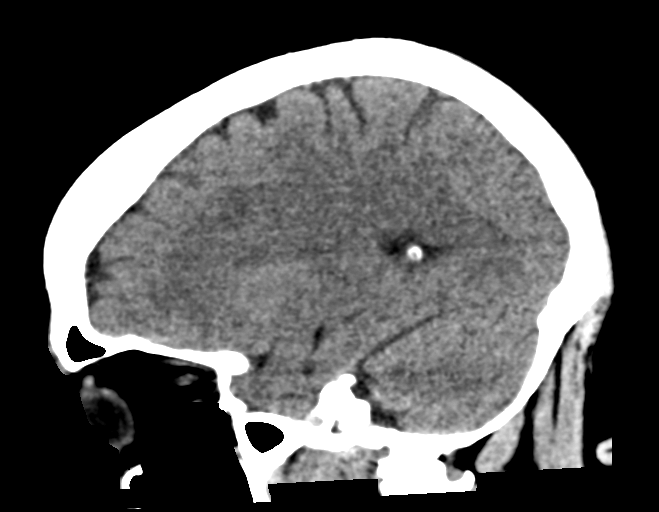

[14 of 47 positions shown; findings below may reference images not displayed]

FINDINGS: Brain: The ventricles are normal in size and configuration. There is
a slight degree of invagination of CSF into the sella, a finding of
doubtful clinical significance. There is no intracranial mass,
hemorrhage, extra-axial fluid collection, or midline shift.
Gray-white compartments are normal. No acute infarct evident.

Vascular: There is no hyperdense vessel. No vascular calcification
evident.

Skull: Bony calvarium appears intact.

Sinuses/Orbits: There is an air-fluid level with moderate
opacification in the right maxillary antrum. There is a probable
retention cyst along the anterior inferior right maxillary antrum.
There is opacification of multiple ethmoid air cells on the right.
There is extensive opacification in the right sphenoid sinus with
air-fluid level. There is opacification in the inferior right
frontal sinus. Left-sided paranasal sinuses appear clear except for
some slight mucosal thickening in the left sphenoid sinus. Orbits
appear symmetric bilaterally.

Other: Mastoids on the right are clear. There is opacification of
multiple mastoid air cells on the left.
IMPRESSION: Multilevel paranasal sinus disease on the right with air-fluid
levels in the right maxillary antrum and right sphenoid region.
Opacification of multiple right-sided ethmoid air cells noted.

Left-sided mastoid air cell disease with opacification of most of
the mastoid air cells on the left.

No intracranial mass hemorrhage, or extra-axial fluid collection.
Gray-white compartments appear normal.

## 2018-10-23 ENCOUNTER — Encounter: Payer: Self-pay | Admitting: *Deleted

## 2018-10-23 DIAGNOSIS — Z8759 Personal history of other complications of pregnancy, childbirth and the puerperium: Secondary | ICD-10-CM | POA: Insufficient documentation

## 2018-10-24 ENCOUNTER — Telehealth: Payer: Self-pay | Admitting: Family Medicine

## 2018-10-24 NOTE — Telephone Encounter (Signed)
Spoke with patient about her appointment on 8/24 @ 8:30. Patient instructed that the appointment will be a telephone visit. PAtient verbalized understanding.

## 2018-10-27 ENCOUNTER — Other Ambulatory Visit: Payer: Self-pay

## 2018-10-27 ENCOUNTER — Ambulatory Visit (INDEPENDENT_AMBULATORY_CARE_PROVIDER_SITE_OTHER): Payer: Medicaid Other

## 2018-10-27 DIAGNOSIS — Z8759 Personal history of other complications of pregnancy, childbirth and the puerperium: Secondary | ICD-10-CM

## 2018-10-27 DIAGNOSIS — O0990 Supervision of high risk pregnancy, unspecified, unspecified trimester: Secondary | ICD-10-CM

## 2018-10-27 MED ORDER — BLOOD PRESSURE KIT DEVI: 1.0000 | 0 refills | Status: DC | PRN

## 2018-10-27 NOTE — Progress Notes (Signed)
I connected with  Bethania on 10/27/18 at  8:30 AM EDT by telephone and verified that I am speaking with the correct person using two identifiers.   I discussed the limitations, risks, security and privacy concerns of performing an evaluation and management service by telephone and the availability of in person appointments. I also discussed with the patient that there may be a patient responsible charge related to this service. The patient expressed understanding and agreed to proceed.  Pt hx obtained.  Pt denies any pain or bleeding.  Pt explained that she will be contacted by [facility] in regards to a BP cuff that she will need to be able to take her BP once per week.  Pt able to download app and place BP value in [employer].  Pt informed that she will get undressed for pap smear and that we will do a gc/ch test, we will offer her genetic screening, OB panel, HgBA1C due weight, and OB urine cx.  I also informed pt that her visits with be virtual as well and the use of MyChart is where her visits will be.  Pt stated that she was having problems with logging into her MyChart.  Pt give MyChart troubleshoot info.  Pt concerns addressed.  Pt did not have any other questions.   Verdell Carmine, RN 10/27/2018  8:32 AM

## 2018-10-27 NOTE — Addendum Note (Signed)
Addended by: Michel Harrow on: 10/27/2018 10:50 AM   Modules accepted: Level of Service

## 2018-10-29 NOTE — Progress Notes (Signed)
Patient seen and assessed by nursing staff during this encounter. I have reviewed the chart and agree with the documentation and plan.  Lodie Waheed, MD 10/29/2018 2:28 PM    

## 2018-10-31 ENCOUNTER — Other Ambulatory Visit: Payer: Self-pay | Admitting: *Deleted

## 2018-10-31 DIAGNOSIS — O219 Vomiting of pregnancy, unspecified: Secondary | ICD-10-CM

## 2018-10-31 MED ORDER — PROMETHAZINE HCL 25 MG PO TABS: 25.0000 mg | ORAL_TABLET | Freq: Four times a day (QID) | ORAL | 0 refills | Status: DC | PRN

## 2018-11-12 LAB — OB RESULTS CONSOLE RUBELLA ANTIBODY, IGM: Rubella: IMMUNE

## 2018-11-12 LAB — OB RESULTS CONSOLE HEPATITIS B SURFACE ANTIGEN: Hepatitis B Surface Ag: NEGATIVE

## 2018-11-13 LAB — OB RESULTS CONSOLE GC/CHLAMYDIA
Chlamydia: NEGATIVE
Chlamydia: NEGATIVE
Gonorrhea: NEGATIVE
Gonorrhea: NEGATIVE

## 2018-11-18 ENCOUNTER — Telehealth: Payer: Self-pay | Admitting: Obstetrics & Gynecology

## 2018-11-18 NOTE — Telephone Encounter (Signed)
Called the patient to confirm upcoming visit. The call was answered with dead air. After saying hello 3x the call was disconnected.

## 2018-11-19 ENCOUNTER — Encounter: Payer: Self-pay | Admitting: Family Medicine

## 2018-11-19 ENCOUNTER — Encounter: Payer: Self-pay | Admitting: Obstetrics and Gynecology

## 2018-11-20 NOTE — Progress Notes (Signed)
Patient did not keep her OB referral appointment for 11/19/2018.  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

## 2019-03-05 LAB — OB RESULTS CONSOLE RPR
RPR: NONREACTIVE
RPR: NONREACTIVE
RPR: NONREACTIVE

## 2019-03-05 LAB — OB RESULTS CONSOLE HIV ANTIBODY (ROUTINE TESTING)
HIV: NONREACTIVE
HIV: NONREACTIVE
HIV: NONREACTIVE
HIV: NONREACTIVE
HIV: NONREACTIVE

## 2019-03-05 LAB — OB RESULTS CONSOLE GC/CHLAMYDIA
Chlamydia: NEGATIVE
Chlamydia: NEGATIVE
Gonorrhea: NEGATIVE
Gonorrhea: NEGATIVE

## 2019-03-06 NOTE — L&D Delivery Note (Signed)
SVD Delivery Note  Patient Name: Cheryl Bright DOB: Jul 25, 1985 MRN: [id number]  Date of admission: 05/29/2019 Delivering MD: Dorisann Frames K  Date of delivery: 05/29/19 Type of delivery: SVD  Newborn Data: Live born female  Birth Weight:   APGAR: 8, 9  Newborn Delivery   Birth date/time: 05/29/2019 23:22:00 Delivery type: Vaginal, Spontaneous    Cheryl Bright, 35 y.o., @ [redacted]w[redacted]d,  [id number], who was admitted for IOL for gestational hypertension. I was called to the room when she progressed 2+ station in the second stage of labor. She pushed for 2 minutes. She delivered a viable infant, cephalic and restituted to the ROT position over an intact perineum.  A nuchal cord was identified. The baby was placed on maternal abdomen while initial step of NRP were perfmored (Dry, Stimulated, and warmed). Hat placed on baby for thermoregulation. Delayed cord clamping was performed for 1 minute.  Cord double clamped and cut.  Cord cut by FOB. Apgar scores were 8 and 9. Prophylactic Pitocin was started in the third stage of labor for active management. The placenta delivered spontaneously, shultz, with a 3 vessel cord and was sent to LD.  Inspection revealed none. An examination of the vaginal vault and cervix was free from lacerations. The uterus was firm, bleeding stable. Placenta and umbilical artery blood gas were not sent.  There were no complications during the procedure.  Mom and baby skin to skin following delivery. Left in stable condition.  Maternal Info: Anesthesia:Epidural Episiotomy: None Lacerations:  None Est. Blood Loss (mL):  150  Newborn Info: Baby Sex: female Babies Name: Gianna APGAR (1 MIN): 8   APGAR (5 MINS): 9    Mom to postpartum.  Baby to Couplet care / Skin to Skin.  June Leap, CNM, MSN 05/29/19 11:45 PM

## 2019-03-18 ENCOUNTER — Other Ambulatory Visit (HOSPITAL_COMMUNITY): Payer: Self-pay | Admitting: Obstetrics and Gynecology

## 2019-03-18 DIAGNOSIS — O2613 Low weight gain in pregnancy, third trimester: Secondary | ICD-10-CM

## 2019-04-13 ENCOUNTER — Encounter (HOSPITAL_COMMUNITY): Payer: Self-pay

## 2019-04-13 ENCOUNTER — Ambulatory Visit (HOSPITAL_COMMUNITY)
Admission: RE | Admit: 2019-04-13 | Payer: Medicaid Other | Source: Ambulatory Visit | Attending: Obstetrics and Gynecology | Admitting: Obstetrics and Gynecology

## 2019-04-13 ENCOUNTER — Ambulatory Visit (HOSPITAL_COMMUNITY): Payer: Medicaid Other

## 2019-04-22 ENCOUNTER — Encounter (HOSPITAL_COMMUNITY): Payer: Self-pay

## 2019-04-22 ENCOUNTER — Ambulatory Visit (HOSPITAL_COMMUNITY)
Admission: RE | Admit: 2019-04-22 | Discharge: 2019-04-22 | Disposition: A | Discharge status: Home / Self Care | Payer: Medicaid Other | Source: Ambulatory Visit | Attending: Obstetrics and Gynecology | Admitting: Obstetrics and Gynecology

## 2019-04-22 ENCOUNTER — Other Ambulatory Visit: Payer: Self-pay

## 2019-04-22 ENCOUNTER — Ambulatory Visit (HOSPITAL_COMMUNITY): Payer: Medicaid Other | Admitting: *Deleted

## 2019-04-22 VITALS — BP 124/79 | HR 91 | Temp 97.8°F

## 2019-04-22 DIAGNOSIS — O99213 Obesity complicating pregnancy, third trimester: Secondary | ICD-10-CM

## 2019-04-22 DIAGNOSIS — Z8759 Personal history of other complications of pregnancy, childbirth and the puerperium: Secondary | ICD-10-CM | POA: Diagnosis present

## 2019-04-22 DIAGNOSIS — Z3A34 34 weeks gestation of pregnancy: Secondary | ICD-10-CM | POA: Diagnosis not present

## 2019-05-05 LAB — OB RESULTS CONSOLE GBS: GBS: NEGATIVE

## 2019-05-29 ENCOUNTER — Other Ambulatory Visit: Payer: Self-pay

## 2019-05-29 ENCOUNTER — Inpatient Hospital Stay (HOSPITAL_COMMUNITY): Payer: Medicaid Other | Admitting: Anesthesiology

## 2019-05-29 ENCOUNTER — Inpatient Hospital Stay (HOSPITAL_COMMUNITY)
Admission: AD | Admit: 2019-05-29 | Discharge: 2019-05-31 | DRG: 806 | Disposition: A | Discharge status: Home / Self Care | Payer: Medicaid Other | Attending: Obstetrics and Gynecology | Admitting: Obstetrics and Gynecology

## 2019-05-29 ENCOUNTER — Encounter (HOSPITAL_COMMUNITY): Payer: Self-pay | Admitting: Obstetrics and Gynecology

## 2019-05-29 DIAGNOSIS — D573 Sickle-cell trait: Secondary | ICD-10-CM | POA: Diagnosis present

## 2019-05-29 DIAGNOSIS — F17210 Nicotine dependence, cigarettes, uncomplicated: Secondary | ICD-10-CM | POA: Diagnosis present

## 2019-05-29 DIAGNOSIS — O26893 Other specified pregnancy related conditions, third trimester: Secondary | ICD-10-CM | POA: Diagnosis present

## 2019-05-29 DIAGNOSIS — O9902 Anemia complicating childbirth: Secondary | ICD-10-CM | POA: Diagnosis present

## 2019-05-29 DIAGNOSIS — O34219 Maternal care for unspecified type scar from previous cesarean delivery: Secondary | ICD-10-CM | POA: Diagnosis not present

## 2019-05-29 DIAGNOSIS — O165 Unspecified maternal hypertension, complicating the puerperium: Secondary | ICD-10-CM | POA: Diagnosis present

## 2019-05-29 DIAGNOSIS — Z8759 Personal history of other complications of pregnancy, childbirth and the puerperium: Secondary | ICD-10-CM

## 2019-05-29 DIAGNOSIS — Z20822 Contact with and (suspected) exposure to covid-19: Secondary | ICD-10-CM | POA: Diagnosis present

## 2019-05-29 DIAGNOSIS — Z3490 Encounter for supervision of normal pregnancy, unspecified, unspecified trimester: Secondary | ICD-10-CM | POA: Diagnosis present

## 2019-05-29 DIAGNOSIS — O99354 Diseases of the nervous system complicating childbirth: Secondary | ICD-10-CM | POA: Diagnosis present

## 2019-05-29 DIAGNOSIS — O99334 Smoking (tobacco) complicating childbirth: Secondary | ICD-10-CM | POA: Diagnosis present

## 2019-05-29 DIAGNOSIS — G35 Multiple sclerosis: Secondary | ICD-10-CM | POA: Diagnosis present

## 2019-05-29 DIAGNOSIS — O134 Gestational [pregnancy-induced] hypertension without significant proteinuria, complicating childbirth: Secondary | ICD-10-CM | POA: Diagnosis present

## 2019-05-29 DIAGNOSIS — O99214 Obesity complicating childbirth: Secondary | ICD-10-CM | POA: Diagnosis present

## 2019-05-29 DIAGNOSIS — Z3A40 40 weeks gestation of pregnancy: Secondary | ICD-10-CM | POA: Diagnosis not present

## 2019-05-29 LAB — COMPREHENSIVE METABOLIC PANEL
ALT: 14 U/L (ref 0–44)
AST: 16 U/L (ref 15–41)
Albumin: 2.7 g/dL — ABNORMAL LOW (ref 3.5–5.0)
Alkaline Phosphatase: 139 U/L — ABNORMAL HIGH (ref 38–126)
Anion gap: 13 (ref 5–15)
BUN: <5 mg/dL — ABNORMAL LOW (ref 6–20)
CO2: 18 mmol/L — ABNORMAL LOW (ref 22–32)
Calcium: 8.7 mg/dL — ABNORMAL LOW (ref 8.9–10.3)
Chloride: 105 mmol/L (ref 98–111)
Creatinine, Ser: 0.68 mg/dL (ref 0.44–1.00)
GFR calc Af Amer: >60 mL/min (ref >60)
GFR calc non Af Amer: >60 mL/min (ref >60)
Glucose, Bld: 150 mg/dL — ABNORMAL HIGH (ref 70–99)
Potassium: 3.6 mmol/L (ref 3.5–5.1)
Sodium: 136 mmol/L (ref 135–145)
Total Bilirubin: 0.6 mg/dL (ref 0.3–1.2)
Total Protein: 5.8 g/dL — ABNORMAL LOW (ref 6.5–8.1)

## 2019-05-29 LAB — CBC
HCT: 35.1 % — ABNORMAL LOW (ref 36.0–46.0)
Hemoglobin: 11.7 g/dL — ABNORMAL LOW (ref 12.0–15.0)
MCH: 31 pg (ref 26.0–34.0)
MCHC: 33.3 g/dL (ref 30.0–36.0)
MCV: 93.1 fL (ref 80.0–100.0)
Platelets: 336 10*3/uL (ref 150–400)
RBC: 3.77 MIL/uL — ABNORMAL LOW (ref 3.87–5.11)
RDW: 14.1 % (ref 11.5–15.5)
WBC: 7.7 10*3/uL (ref 4.0–10.5)
nRBC: 0 % (ref 0.0–0.2)

## 2019-05-29 LAB — TYPE AND SCREEN
ABO/RH(D): O POS
Antibody Screen: NEGATIVE

## 2019-05-29 LAB — PROTEIN / CREATININE RATIO, URINE
Creatinine, Urine: 71.56 mg/dL
Protein Creatinine Ratio: 0.14 mg/mg{Cre} (ref 0.00–0.15)
Total Protein, Urine: 10 mg/dL

## 2019-05-29 LAB — URIC ACID: Uric Acid, Serum: 4.5 mg/dL (ref 2.5–7.1)

## 2019-05-29 LAB — SARS CORONAVIRUS 2 (TAT 6-24 HRS): SARS Coronavirus 2: NEGATIVE

## 2019-05-29 LAB — LACTATE DEHYDROGENASE: LDH: 121 U/L (ref 98–192)

## 2019-05-29 LAB — ABO/RH: ABO/RH(D): O POS

## 2019-05-29 MED ORDER — OXYCODONE-ACETAMINOPHEN 5-325 MG PO TABS: 2.0000 | ORAL_TABLET | ORAL | Status: DC | PRN

## 2019-05-29 MED ORDER — OXYTOCIN 40 UNITS IN NORMAL SALINE INFUSION - SIMPLE MED: 2.5000 [IU]/h | INTRAVENOUS | Status: DC

## 2019-05-29 MED ORDER — TERBUTALINE SULFATE 1 MG/ML IJ SOLN: 0.2500 mg | Freq: Once | INTRAMUSCULAR | Status: DC | PRN

## 2019-05-29 MED ORDER — ACETAMINOPHEN 325 MG PO TABS: 650.0000 mg | ORAL_TABLET | ORAL | Status: DC | PRN

## 2019-05-29 MED ORDER — SODIUM CHLORIDE (PF) 0.9 % IJ SOLN
INTRAMUSCULAR | Status: DC | PRN
  Administered 2019-05-29: 12 mL/h via EPIDURAL

## 2019-05-29 MED ORDER — BUPIVACAINE HCL (PF) 0.25 % IJ SOLN
INTRAMUSCULAR | Status: DC | PRN
  Administered 2019-05-29 (×2): 4 mL via EPIDURAL

## 2019-05-29 MED ORDER — OXYCODONE-ACETAMINOPHEN 5-325 MG PO TABS: 1.0000 | ORAL_TABLET | ORAL | Status: DC | PRN

## 2019-05-29 MED ORDER — SOD CITRATE-CITRIC ACID 500-334 MG/5ML PO SOLN: 30.0000 mL | ORAL | Status: DC | PRN

## 2019-05-29 MED ORDER — OXYTOCIN 40 UNITS IN NORMAL SALINE INFUSION - SIMPLE MED
1.0000 m[IU]/min | INTRAVENOUS | Status: DC
  Administered 2019-05-29: 16:00:00 2 m[IU]/min via INTRAVENOUS
  Filled 2019-05-29: qty 1000

## 2019-05-29 MED ORDER — LACTATED RINGERS IV SOLN: 500.0000 mL | INTRAVENOUS | Status: DC | PRN

## 2019-05-29 MED ORDER — FENTANYL-BUPIVACAINE-NACL 0.5-0.125-0.9 MG/250ML-% EP SOLN
EPIDURAL | Status: AC
  Filled 2019-05-29: qty 250

## 2019-05-29 MED ORDER — ONDANSETRON HCL 4 MG/2ML IJ SOLN: 4.0000 mg | Freq: Four times a day (QID) | INTRAMUSCULAR | Status: DC | PRN

## 2019-05-29 MED ORDER — CALCIUM CARBONATE ANTACID 500 MG PO CHEW
1.0000 | CHEWABLE_TABLET | Freq: Three times a day (TID) | ORAL | Status: DC
  Administered 2019-05-29: 200 mg via ORAL
  Filled 2019-05-29: qty 1

## 2019-05-29 MED ORDER — LIDOCAINE HCL (PF) 1 % IJ SOLN: 30.0000 mL | INTRAMUSCULAR | Status: DC | PRN

## 2019-05-29 MED ORDER — LIDOCAINE-EPINEPHRINE (PF) 2 %-1:200000 IJ SOLN
INTRAMUSCULAR | Status: DC | PRN
  Administered 2019-05-29: 5 mL via EPIDURAL

## 2019-05-29 MED ORDER — OXYTOCIN BOLUS FROM INFUSION: 500.0000 mL | Freq: Once | INTRAVENOUS | Status: DC

## 2019-05-29 MED ORDER — LACTATED RINGERS IV SOLN: INTRAVENOUS | Status: DC

## 2019-05-29 NOTE — Progress Notes (Signed)
Cheryl Bright is a 34 y.o. female, [id number], IUP at 40.1 weeks, presenting for IOL. Pt endorse + Fm. Denies vaginal leakage. Denies vaginal bleeding. Denies feeling cxt's. Pt had elevated BP in the office and was sent for direct admission. Denies HA, epigastric pain, or visual changes.   Patient Active Problem List   Diagnosis Date Noted  . Encounter for induction of labor 05/29/2019  . History of pregnancy induced hypertension 10/23/2018  . 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 Supervision of high risk pregnancy, antepartum 10/02/2014  . Maternal multiple sclerosis   . PCOS (polycystic ovarian syndrome) 09/15/2012   Medications Prior to Admission  Medication Sig Dispense Refill Last Dose  . Blood Pressure Monitoring (BLOOD PRESSURE KIT) DEVI 1 Device by Does not apply route as needed. ICD 10: O09.90 1 Device 0   . Ferrous Sulfate (IRON PO) Take by mouth.     . Prenatal Vit-Fe Fumarate-FA (PREPLUS) 27-1 MG TABS Take 1 tablet by mouth daily.     . promethazine (PHENERGAN) 25 MG tablet Take 1 tablet (25 mg total) by mouth every 6 (six) hours as needed for nausea or vomiting. (Patient not taking: Reported on 04/22/2019) 30 tablet 0     Past Medical History:  Diagnosis Date  . Depression   . MS (multiple sclerosis) ([place])   . Pregnancy induced hypertension   . Sickle cell trait ([place])      No current facility-administered medications on file prior to encounter.   Current Outpatient Medications on File Prior to Encounter  Medication Sig Dispense Refill  . Blood Pressure Monitoring (BLOOD PRESSURE KIT) DEVI 1 Device by Does not apply route as needed. ICD 10: O09.90 1 Device 0  . Ferrous Sulfate (IRON PO) Take by mouth.    . Prenatal Vit-Fe Fumarate-FA (PREPLUS) 27-1 MG TABS Take 1 tablet by mouth daily.    . promethazine (PHENERGAN) 25 MG tablet Take 1 tablet (25 mg total) by mouth every 6 (six) hours as needed for nausea or vomiting. (Patient not taking:  Reported on 04/22/2019) 30 tablet 0     Allergies  Allergen Reactions  . Sulfa Drugs Cross Reactors Hives and Swelling  . Latex Rash    History of present pregnancy: Pt Info/Preference:  Screening/Consents:  Labs:   EDD: Estimated Date of Delivery: 05/28/19  Establised: Patient's last menstrual period was 08/21/2018.  Anatomy Scan Date: 01/08/2019 Placenta Location: Anterior Genetic Screen Panoroma: Low risk female AFP: neg First Tri: Quad:  Office: [facility]            First PNV: 54w6dBlood Type  O+  Language: English Last PNV: 411w1dhogam  N/A  Flu Vaccine:  No   Antibody  neg  TDaP vaccine Yes   GTT: Early: 5.4 Third Trimester: 108  Feeding Plan: Breast/bottle BTL: No Rubella:  immune  Contraception: undecided VBAC: Yes RPR:  non-reactive  Circumcision: N/A   HBsAg:  negative  Pediatrician:  [facility]   HIV:   neg  Prenatal Classes: No Additional [place]2/17/2021 EFW 46% GBS: nge  (For PCN allergy, check sensitivities)       Chlamydia: neg    MFM Referral/Consult:  GC: neg  Support Person: FOB   PAP: 2020  Pain Management: epidural Neonatologist Referral:  Hgb Electrophoresis:  Echo trait  Birth Plan: No   Hgb NOB: 12.8    28W: 9.9   OB History    Gravida  7   Para  4  Term  4   Preterm      AB  2   Living  4     SAB  1   TAB      Ectopic      Multiple  0   Live Births  4          Past Medical History:  Diagnosis Date  . Depression   . MS (multiple sclerosis) ([place])   . Pregnancy induced hypertension   . Sickle cell trait [facility])    Past Surgical History:  Procedure Laterality Date  . CESAREAN SECTION    . DILATION AND EVACUATION N/A 03/06/2018   Procedure: DILATATION AND EVACUATION;  Surgeon: Osborne Oman, MD;  Location: [facility];  Service: Gynecology;  Laterality: N/A;  . FOOT SURGERY     Family History: family history includes Multiple sclerosis in her sister. Social History:  reports that she has been smoking cigarettes. She has a 1.25 pack-year  smoking history. She has never used smokeless tobacco. She reports previous alcohol use. She reports previous drug use. Drug: Marijuana.   Prenatal Transfer Tool  Maternal Diabetes: No Genetic Screening: Normal Maternal Ultrasounds/Referrals: Normal Fetal Ultrasounds or other Referrals:  None Maternal Substance Abuse:  No Significant Maternal Medications:  None Significant Maternal Lab Results: None  ROS:  Review of Systems  Constitutional: Negative for chills and fever.  HENT: Negative for congestion, sinus pain and sore throat.   Eyes: Negative for blurred vision.  Respiratory: Negative for cough and shortness of breath.   Cardiovascular: Negative for chest pain and leg swelling.  Gastrointestinal: Negative for abdominal pain, heartburn, nausea and vomiting.  Musculoskeletal: Negative for back pain.  Skin: Negative for rash.  Neurological: Negative for weakness and headaches.    Physical Exam: LMP 08/21/2018   Physical Exam  Constitutional: She is oriented to person, place, and time and well-developed, well-nourished, and in no distress.  HENT:  Head: Normocephalic.  Eyes: Pupils are equal, round, and reactive to light. Conjunctivae are normal.  Cardiovascular: Normal rate and regular rhythm.  Pulmonary/Chest: Effort normal.  Abdominal: Soft. Bowel sounds are normal.  Genitourinary:    Vagina, cervix and uterus normal.   Musculoskeletal:        General: Normal range of motion.     Cervical back: Normal range of motion.  Neurological: She is alert and oriented to person, place, and time.  Skin: Skin is warm and dry.  Psychiatric: Affect normal.    NST: FHR baseline 135 bpm, Variability: moderate, Accelerations:present, Decelerations:  Absent= Cat 1/Reactive UC:   none SVE: 3/50/-3, vertex verified by fetal sutures.  Leopold's: Position vertex, EFW 7.5lbs via leopold's.   Meds ordered this encounter  Medications  . lactated ringers infusion  . oxytocin (PITOCIN) IV  BOLUS FROM BAG  . oxytocin (PITOCIN) IV infusion 40 units in NS 1000 mL - Premix  . lactated ringers infusion 500-1,000 mL  . acetaminophen (TYLENOL) tablet 650 mg  . oxyCODONE-acetaminophen (PERCOCET/ROXICET) 5-325 MG per tablet 1 tablet  . oxyCODONE-acetaminophen (PERCOCET/ROXICET) 5-325 MG per tablet 2 tablet  . ondansetron (ZOFRAN) injection 4 mg  . sodium citrate-citric acid (ORACIT) solution 30 mL  . lidocaine (PF) (XYLOCAINE) 1 % injection 30 mL   Assessment/Plan: Cheryl Bright is a 34 y.o. female, [id number], IUP at 40.1 weeks, presenting for IOL.  FWB: Cat 1 Fetal Tracing.  Plan: Start Pitocin 2x2, will plan AROM when adequate contraction pattern Routine [facility] orders Pan med/epidural prn Anticipate labor progression and  vaginal delivery.   Suzan Nailer, CNM, MSN 05/29/2019, 2:47 PM

## 2019-05-29 NOTE — Progress Notes (Signed)
Labor Progress Note  Subjective: Cheryl Bright, 34 y.o., [id number], with an IUP @ [redacted]w[redacted]d, presented for  Patient Active Problem List   Diagnosis Date Noted  . Encounter for induction of labor 05/29/2019  . History of pregnancy induced hypertension 10/23/2018  . 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 Supervision of high risk pregnancy, antepartum 10/02/2014  . Maternal multiple sclerosis   . PCOS (polycystic ovarian syndrome) 09/15/2012   Objective: BP (!) 144/93   Pulse 74   Temp 97.8 F (36.6 C) (Oral)   Resp 16   Ht 5\' 1"  (1.549 m)   Wt 96.3 kg   LMP 08/21/2018   BMI 40.09 kg/m  No intake/output data recorded. No intake/output data recorded. NST: FHR baseline 125 bpm, Variability: moderate, Accelerations:present, Decelerations:  Absent= Cat 1/Reactive CTX:  regular, every 2-3 minutes Uterus gravid, soft non tender, moderate to palpate with contractions.  SVE:  5/90/-2 Pitocin at 10 mUn/min  Assessment:  Cheryl Bright, 34 y.o., [phone], with an IUP @ [redacted]w[redacted]d, presented for  Patient Active Problem List   Diagnosis Date Noted  . Encounter for induction of labor 05/29/2019  . History of pregnancy induced hypertension 10/23/2018  . 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 Supervision of high risk pregnancy, antepartum 10/02/2014  . Maternal multiple sclerosis   . PCOS (polycystic ovarian syndrome) 09/15/2012   IOL d/t gestational hypertension NICHD: Category 1 Membranes: AROM @ 2145, clear fluid no s/s of infection Induction:    Pitocin - 24mu Pain management:               Epidural placement at 2044  GBS negative  Plan: Continue labor plan Continuous monitoring Rest Frequent position changes to facilitate fetal rotation and descent. Will reassess with cervical exam at 0100 or earlier if necessary Continue pitocin per protocol Anticipate labor progression and vaginal delivery.   Dr. 2045 aware of plan  and verbalized agreement.   Richardson Dopp, CNM, MSN 05/29/2019. 9:59 PM

## 2019-05-29 NOTE — Anesthesia Preprocedure Evaluation (Signed)
Anesthesia Evaluation  Patient identified by MRN, date of birth, ID band Patient awake    Reviewed: Allergy & Precautions, Patient's Chart, lab work & pertinent test results  History of Anesthesia Complications Negative for: history of anesthetic complications  Airway Mallampati: III  TM Distance: >3 FB Neck ROM: Full    Dental  (+) Dental Advisory Given   Pulmonary neg pulmonary ROS, Current Smoker and Patient abstained from smoking.,  covid pending   breath sounds clear to auscultation       Cardiovascular hypertension,  Rhythm:Regular  Pregnancy induced htn   Neuro/Psych PSYCHIATRIC DISORDERS Depression negative neurological ROS     GI/Hepatic negative GI ROS, Neg liver ROS,   Endo/Other  Morbid obesity  Renal/GU negative Renal ROS     Musculoskeletal   Abdominal   Peds  Hematology  (+) Blood dyscrasia, anemia , plt 336    Anesthesia Other Findings   Reproductive/Obstetrics (+) Pregnancy                             Anesthesia Physical Anesthesia Plan  ASA: II  Anesthesia Plan: Epidural   Post-op Pain Management:    Induction:   PONV Risk Score and Plan: Treatment may vary due to age or medical condition  Airway Management Planned:   Additional Equipment: Fetal Monitoring  Intra-op Plan:   Post-operative Plan:   Informed Consent: I have reviewed the patients History and Physical, chart, labs and discussed the procedure including the risks, benefits and alternatives for the proposed anesthesia with the patient or authorized representative who has indicated his/her understanding and acceptance.       Plan Discussed with:   Anesthesia Plan Comments:         Anesthesia Quick Evaluation

## 2019-05-29 NOTE — Anesthesia Procedure Notes (Signed)
Epidural Patient location during procedure: floor Start time: 05/29/2019 8:44 PM End time: 05/29/2019 8:58 PM  Staffing Anesthesiologist: Val Eagle, MD Performed: anesthesiologist   Preanesthetic Checklist Completed: patient identified, IV checked, risks and benefits discussed, surgical consent, monitors and equipment checked, pre-op evaluation and timeout performed  Epidural Patient position: sitting Prep: DuraPrep Patient monitoring: heart rate Approach: midline Location: L3-L4 Injection technique: LOR saline  Needle:  Needle type: Tuohy  Needle gauge: 17 G Needle length: 9 cm Needle insertion depth: 7 cm Catheter type: closed end flexible Catheter size: 19 Gauge Catheter at skin depth: 12 cm Test dose: negative and 2% lidocaine with Epi 1:200 K  Additional Notes Reason for block:at surgeon's request and procedure for pain

## 2019-05-30 ENCOUNTER — Encounter (HOSPITAL_COMMUNITY): Payer: Self-pay | Admitting: Obstetrics and Gynecology

## 2019-05-30 DIAGNOSIS — O34219 Maternal care for unspecified type scar from previous cesarean delivery: Secondary | ICD-10-CM | POA: Diagnosis not present

## 2019-05-30 LAB — RPR: RPR Ser Ql: NONREACTIVE

## 2019-05-30 LAB — CBC
HCT: 27.3 % — ABNORMAL LOW (ref 36.0–46.0)
Hemoglobin: 9.4 g/dL — ABNORMAL LOW (ref 12.0–15.0)
MCH: 31.2 pg (ref 26.0–34.0)
MCHC: 34.4 g/dL (ref 30.0–36.0)
MCV: 90.7 fL (ref 80.0–100.0)
Platelets: 254 10*3/uL (ref 150–400)
RBC: 3.01 MIL/uL — ABNORMAL LOW (ref 3.87–5.11)
RDW: 14 % (ref 11.5–15.5)
WBC: 10.2 10*3/uL (ref 4.0–10.5)
nRBC: 0 % (ref 0.0–0.2)

## 2019-05-30 MED ORDER — SIMETHICONE 80 MG PO CHEW: 80.0000 mg | CHEWABLE_TABLET | ORAL | Status: DC | PRN

## 2019-05-30 MED ORDER — DIPHENHYDRAMINE HCL 25 MG PO CAPS: 25.0000 mg | ORAL_CAPSULE | Freq: Four times a day (QID) | ORAL | Status: DC | PRN

## 2019-05-30 MED ORDER — COCONUT OIL OIL: 1.0000 "application " | TOPICAL_OIL | Status: DC | PRN

## 2019-05-30 MED ORDER — BENZOCAINE-MENTHOL 20-0.5 % EX AERO
1.0000 "application " | INHALATION_SPRAY | CUTANEOUS | Status: DC | PRN
  Administered 2019-05-30: 1 via TOPICAL
  Filled 2019-05-30: qty 56

## 2019-05-30 MED ORDER — ZOLPIDEM TARTRATE 5 MG PO TABS: 5.0000 mg | ORAL_TABLET | Freq: Every evening | ORAL | Status: DC | PRN

## 2019-05-30 MED ORDER — ONDANSETRON HCL 4 MG/2ML IJ SOLN: 4.0000 mg | INTRAMUSCULAR | Status: DC | PRN

## 2019-05-30 MED ORDER — SENNOSIDES-DOCUSATE SODIUM 8.6-50 MG PO TABS
2.0000 | ORAL_TABLET | ORAL | Status: DC
  Administered 2019-05-31: 2 via ORAL
  Filled 2019-05-30: qty 2

## 2019-05-30 MED ORDER — PRENATAL MULTIVITAMIN CH
1.0000 | ORAL_TABLET | Freq: Every day | ORAL | Status: DC
  Administered 2019-05-30 – 2019-05-31 (×2): 1 via ORAL
  Filled 2019-05-30 (×2): qty 1

## 2019-05-30 MED ORDER — ACETAMINOPHEN 325 MG PO TABS: 650.0000 mg | ORAL_TABLET | ORAL | Status: DC | PRN

## 2019-05-30 MED ORDER — TETANUS-DIPHTH-ACELL PERTUSSIS 5-2.5-18.5 LF-MCG/0.5 IM SUSP: 0.5000 mL | Freq: Once | INTRAMUSCULAR | Status: DC

## 2019-05-30 MED ORDER — IBUPROFEN 600 MG PO TABS
600.0000 mg | ORAL_TABLET | Freq: Four times a day (QID) | ORAL | Status: DC
  Administered 2019-05-30 – 2019-05-31 (×6): 600 mg via ORAL
  Filled 2019-05-30 (×6): qty 1

## 2019-05-30 MED ORDER — NIFEDIPINE ER OSMOTIC RELEASE 30 MG PO TB24
30.0000 mg | ORAL_TABLET | Freq: Every day | ORAL | Status: DC
  Administered 2019-05-30 – 2019-05-31 (×2): 30 mg via ORAL
  Filled 2019-05-30 (×2): qty 1

## 2019-05-30 MED ORDER — ONDANSETRON HCL 4 MG PO TABS: 4.0000 mg | ORAL_TABLET | ORAL | Status: DC | PRN

## 2019-05-30 MED ORDER — WITCH HAZEL-GLYCERIN EX PADS: 1.0000 "application " | MEDICATED_PAD | CUTANEOUS | Status: DC | PRN

## 2019-05-30 MED ORDER — DIBUCAINE (PERIANAL) 1 % EX OINT: 1.0000 "application " | TOPICAL_OINTMENT | CUTANEOUS | Status: DC | PRN

## 2019-05-30 NOTE — Progress Notes (Signed)
Subjective: Postpartum Day # 1 : S/P NSVD due to IOL. Patient up ad lib, denies syncope or dizziness. Reports consuming regular diet without issues and denies N/V. Denies issues with urination and reports bleeding is "manageable."  Patient is breast and bottle feeding and reports going well.  Desires IUD for postpartum contraception.  Pain is being appropriately managed with use of Motrin.   No laceration Feeding:  Breast and bottle Contraceptive plan:  IUD  Objective: Vital signs in last 24 hours: Patient Vitals for the past 24 hrs:  BP Temp Temp src Pulse Resp SpO2 Height Weight  05/30/19 1015 (!) 145/94 98.3 F (36.8 C) Oral 86 18 -- -- --  05/30/19 0547 (!) 144/83 98.6 F (37 C) Oral (!) 56 18 100 % -- --  05/30/19 0205 (!) 143/81 98.3 F (36.8 C) Oral 70 18 100 % -- --  05/30/19 0105 137/82 98.1 F (36.7 C) Oral (!) 59 20 100 % -- --  05/30/19 0032 (!) 148/82 -- -- (!) 58 -- -- -- --  05/30/19 0017 (!) 134/95 -- -- 67 -- -- -- --  05/30/19 0006 138/80 -- -- 75 -- -- -- --  05/29/19 2347 (!) 151/90 -- -- 77 -- -- -- --  05/29/19 2332 (!) 144/95 -- -- 84 -- -- -- --  05/29/19 2330 (!) 119/95 98 F (36.7 C) Tympanic 89 16 -- -- --  05/29/19 2300 (!) 143/77 -- -- 68 -- -- -- --  05/29/19 2232 (!) 147/86 -- -- 70 -- -- -- --  05/29/19 2123 (!) 158/97 -- -- 78 -- -- -- --  05/29/19 2118 (!) 160/94 -- -- 72 -- -- -- --  05/29/19 2105 (!) 153/86 -- -- 91 -- -- -- --  05/29/19 2102 (!) 155/92 -- -- 72 -- -- -- --  05/29/19 2059 (!) 152/89 -- -- 80 -- -- -- --  05/29/19 2056 (!) 153/95 -- -- 82 -- -- -- --  05/29/19 2054 (!) 140/94 -- -- 82 -- -- -- --  05/29/19 2015 (!) 144/93 97.8 F (36.6 C) Oral 74 16 -- -- --  05/29/19 1834 140/83 -- -- 75 17 -- -- --  05/29/19 1722 124/87 -- -- 80 17 -- -- --  05/29/19 1613 (!) 141/89 -- -- 83 16 -- -- --  05/29/19 1513 129/86 98.9 F (37.2 C) Oral 87 16 -- 5\' 1"  (1.549 m) 96.3 kg   Physical Exam:  General: alert and  cooperative Mood/Affect: appropriate, overjoyed with birth of Gianna Lungs: clear to auscultation, no wheezes, rales or rhonchi, symmetric air entry.  Heart: normal rate, regular rhythm, normal S1, S2, no murmurs, rubs, clicks or gallops. Breast: breasts appear normal, no suspicious masses, no skin or nipple changes or axillary nodes. Abdomen:  + bowel sounds, soft, non-tender GU: perineum intact, healing well. No signs of external hematomas.  Uterine Fundus: firm Lochia: appropriate Skin: Warm, Dry. DVT Evaluation: No evidence of DVT seen on physical exam.  CBC Latest Ref Rng & Units 05/30/2019 05/29/2019 03/06/2018  WBC 4.0 - 10.5 K/uL 10.2 7.7 8.4  Hemoglobin 12.0 - 15.0 g/dL 05/05/2018) 11.7(L) 13.0  Hematocrit 36.0 - 46.0 % 27.3(L) 35.1(L) 38.3  Platelets 150 - 400 K/uL 254 336 323    Results for orders placed or performed during the hospital encounter of 05/29/19 (from the past 24 hour(s))  Protein / creatinine ratio, urine     Status: None   Collection Time: 05/29/19  2:10 PM  Result Value  Ref Range   Creatinine, Urine 71.56 mg/dL   Total Protein, Urine 10 mg/dL   Protein Creatinine Ratio 0.14 0.00 - 0.15 mg/mg[Cre]  Lactate dehydrogenase     Status: None   Collection Time: 05/29/19  3:00 PM  Result Value Ref Range   LDH 121 98 - 192 U/L  Uric acid     Status: None   Collection Time: 05/29/19  3:00 PM  Result Value Ref Range   Uric Acid, Serum 4.5 2.5 - 7.1 mg/dL  Comprehensive metabolic panel     Status: Abnormal   Collection Time: 05/29/19  3:00 PM  Result Value Ref Range   Sodium 136 135 - 145 mmol/L   Potassium 3.6 3.5 - 5.1 mmol/L   Chloride 105 98 - 111 mmol/L   CO2 18 (L) 22 - 32 mmol/L   Glucose, Bld 150 (H) 70 - 99 mg/dL   BUN <5 (L) 6 - 20 mg/dL   Creatinine, Ser 0.68 0.44 - 1.00 mg/dL   Calcium 8.7 (L) 8.9 - 10.3 mg/dL   Total Protein 5.8 (L) 6.5 - 8.1 g/dL   Albumin 2.7 (L) 3.5 - 5.0 g/dL   AST 16 15 - 41 U/L   ALT 14 0 - 44 U/L   Alkaline Phosphatase 139  (H) 38 - 126 U/L   Total Bilirubin 0.6 0.3 - 1.2 mg/dL   GFR calc non Af Amer >60 >60 mL/min   GFR calc Af Amer >60 >60 mL/min   Anion gap 13 5 - 15  CBC     Status: Abnormal   Collection Time: 05/29/19  3:05 PM  Result Value Ref Range   WBC 7.7 4.0 - 10.5 K/uL   RBC 3.77 (L) 3.87 - 5.11 MIL/uL   Hemoglobin 11.7 (L) 12.0 - 15.0 g/dL   HCT 35.1 (L) 36.0 - 46.0 %   MCV 93.1 80.0 - 100.0 fL   MCH 31.0 26.0 - 34.0 pg   MCHC 33.3 30.0 - 36.0 g/dL   RDW 14.1 11.5 - 15.5 %   Platelets 336 150 - 400 K/uL   nRBC 0.0 0.0 - 0.2 %  Type and screen [place]     Status: None   Collection Time: 05/29/19  3:06 PM  Result Value Ref Range   ABO/RH(D) O POS    Antibody Screen NEG    Sample Expiration      06/01/2019,2359 Performed at [facility], [address], [place], [place] [address]   ABO/Rh     Status: None   Collection Time: 05/29/19  3:06 PM  Result Value Ref Range   ABO/RH(D)      O POS Performed at [facility] [address] [place], [place] [address]   SARS CORONAVIRUS 2 (TAT 6-24 HRS) Nasopharyngeal Nasopharyngeal Swab     Status: None   Collection Time: 05/29/19  3:28 PM   Specimen: Nasopharyngeal Swab  Result Value Ref Range   SARS Coronavirus 2 NEGATIVE NEGATIVE  CBC     Status: Abnormal   Collection Time: 05/30/19  5:15 AM  Result Value Ref Range   WBC 10.2 4.0 - 10.5 K/uL   RBC 3.01 (L) 3.87 - 5.11 MIL/uL   Hemoglobin 9.4 (L) 12.0 - 15.0 g/dL   HCT 27.3 (L) 36.0 - 46.0 %   MCV 90.7 80.0 - 100.0 fL   MCH 31.2 26.0 - 34.0 pg   MCHC 34.4 30.0 - 36.0 g/dL   RDW 14.0 11.5 -  15.5 %   Platelets 254 150 - 400 K/uL   nRBC 0.0 0.0 - 0.2 %   CBG (last 3)  No results for input(s): GLUCAP in the last 72 hours.   I/O last 3 completed shifts: In: -  Out: 900 [Urine:700; Blood:200]   Assessment Postpartum Day # 1 : S/P NSVD due to IOL. Pt stable. Appropriate involution. Breast and bottle feeding. Hemodynamically stable.    Plan: Continue other mgmt as ordered VTE prophylactics: Early ambulated as tolerates.  Pain control: Motrin/Tylenol PRN Will monitor blood pressure every 4 hours to determine trend and need for further management Education given regarding options for contraception, including injectable contraception, IUD placement, oral contraceptives.  Plan for discharge tomorrow Dr. Richardson Dopp to be updated on patient status  Clancy Gourd, MSN 05/30/2019, 12:27 PM

## 2019-05-30 NOTE — Anesthesia Postprocedure Evaluation (Signed)
Anesthesia Post Note  Patient: Cheryl Bright  Procedure(s) Performed: AN AD HOC LABOR EPIDURAL     Patient location during evaluation: Mother Baby Anesthesia Type: Epidural Level of consciousness: awake and alert Pain management: pain level controlled Vital Signs Assessment: post-procedure vital signs reviewed and stable Respiratory status: spontaneous breathing Cardiovascular status: stable Postop Assessment: no headache, adequate PO intake, no backache, patient able to bend at knees, able to ambulate, epidural receding and no apparent nausea or vomiting Anesthetic complications: no    Last Vitals:  Vitals:   05/30/19 0205 05/30/19 0547  BP: (!) 143/81 (!) 144/83  Pulse: 70 (!) 56  Resp: 18 18  Temp: 36.8 C 37 C  SpO2: 100% 100%    Last Pain:  Vitals:   05/30/19 0547  TempSrc: Oral  PainSc:    Pain Goal:                   Salome Arnt

## 2019-05-30 NOTE — Lactation Note (Signed)
This note was copied from a baby's chart. Lactation Consultation Note  Patient Name: Cheryl Bright Date: 05/30/2019  P5, 5 hour term female infant. Mom's feeding choice at admission is breast and formula feeding.  LC entered room mom and infant asleep.   Maternal Data    Feeding Feeding Type: Bottle Fed - Formula Nipple Type: Slow - flow  LATCH Score                   Interventions    Lactation Tools Discussed/Used     Consult Status      Danelle Earthly 05/30/2019, 4:31 AM

## 2019-05-30 NOTE — Lactation Note (Signed)
This note was copied from a baby's chart. Lactation Consultation Note  Patient Name: Cheryl Bright UXNAT'F Date: 05/30/2019 Reason for consult: Initial assessment;Term;Other (Comment)(DAT (+))  17 hours old FT female who is being partially BF and formula fed by her mother, she's a P5 and experienced BF. She BF her other kids for 3-6 months and her main BF difficulty was that her milk "dry out" after that time. She participated in the [facility] program and she's already familiar with hand expression. She said that she hasn't seen colostrum when doing so though, but when [facility] offered assistance with hand expression, mom politely declined, she said she felt comfortable doing it herself.  Mom also told LC she just finished feeding baby, LC noticed baby was in cross cradle position and STS with mom when entering the room, praised her for her efforts. Per mom BF is going well, but she's also supplementing with Rush Barer formula; that was her feeding choice on admission. Baby is DAT (+) but parents were unaware of DAT status. LC offered to set up a DEBP pump, but mom said she'll think about it.  Asked mom to call for assistance when needed.  Reviewed supplementation guidelines according to baby's age in hours, size of baby's stomach, feeding cues, cluster feeding, newborn jaundice and normal newborn behavior.  Feeding plan:  1. Encouraged mom to feed baby STS 8-12 times/24 hours or sooner if feeding cues are present 2. Hand expression and spoon feeding were also encouraged 3. Parents will continue supplementing baby with Rush Barer formula, according to formula supplementation guidelines per baby's age in hours  Dad present and supportive. Parents reported all questions and concerns were answered, they're both aware of LC OP services and will call PRN.   Maternal Data Formula Feeding for Exclusion: Yes Reason for exclusion: Mother's choice to formula and breast feed on admission Has patient been taught Hand  Expression?: Yes Does the patient have breastfeeding experience prior to this delivery?: Yes  Feeding Feeding Type: Breast Fed  LATCH Score Latch: Grasps breast easily, tongue down, lips flanged, rhythmical sucking.  Audible Swallowing: A few with stimulation  Type of Nipple: Everted at rest and after stimulation  Comfort (Breast/Nipple): Soft / non-tender  Hold (Positioning): Assistance needed to correctly position infant at breast and maintain latch.  LATCH Score: 8  Interventions Interventions: Breast feeding basics reviewed  Lactation Tools Discussed/Used WIC Program: Yes   Consult Status Consult Status: Follow-up Date: 05/31/19 Follow-up type: In-patient    Tenna Lacko Venetia Constable 05/30/2019, 4:26 PM

## 2019-05-31 DIAGNOSIS — O165 Unspecified maternal hypertension, complicating the puerperium: Secondary | ICD-10-CM | POA: Diagnosis present

## 2019-05-31 DIAGNOSIS — O9902 Anemia complicating childbirth: Secondary | ICD-10-CM | POA: Diagnosis present

## 2019-05-31 MED ORDER — IBUPROFEN 600 MG PO TABS: 600.0000 mg | ORAL_TABLET | Freq: Four times a day (QID) | ORAL | 0 refills | Status: DC

## 2019-05-31 MED ORDER — ACETAMINOPHEN 325 MG PO TABS: 650.0000 mg | ORAL_TABLET | ORAL | Status: DC | PRN

## 2019-05-31 MED ORDER — NIFEDIPINE ER 30 MG PO TB24: 30.0000 mg | ORAL_TABLET | Freq: Every day | ORAL | 0 refills | Status: DC

## 2019-05-31 MED ORDER — COCONUT OIL OIL: 1.0000 "application " | TOPICAL_OIL | 0 refills | Status: DC | PRN

## 2019-05-31 MED ORDER — BENZOCAINE-MENTHOL 20-0.5 % EX AERO: 1.0000 "application " | INHALATION_SPRAY | CUTANEOUS | Status: DC | PRN

## 2019-05-31 NOTE — Progress Notes (Signed)
CSW acknowledged consult and attempted to meet with MOB. However, MOB and infant had been discharged.  Behr Cislo D. Dortha Kern, MSW, [facility] Clinical Social Worker [phone]

## 2019-05-31 NOTE — Discharge Summary (Signed)
OB Discharge Summary     Patient Name: Cheryl Bright DOB: 23-Jul-1985 MRN: [id number]  Date of admission: 05/29/2019 Delivering MD: Gavin Potters K   Date of discharge: 05/31/2019  Admitting diagnosis: Encounter for induction of labor [Z34.90] Vaginal delivery [O80] Intrauterine pregnancy: [redacted]w[redacted]d    Secondary diagnosis:  Principal Problem:   Postpartum care following VBAC delivery 3/26 Active Problems:   Encounter for induction of labor   VBAC, delivered   Maternal anemia, with delivery   Postpartum hypertension  Additional problems: none     Discharge diagnosis: Principal Problem:   Postpartum care following VBAC delivery 3/26 Active Problems:   Encounter for induction of labor   VBAC, delivered   Maternal anemia, with delivery   Postpartum hypertension                                                                                                Post partum procedures:none  Augmentation: AROM and Pitocin  Complications: None  Hospital course:  Induction of Labor With Vaginal Delivery   34y.o. yo [id number] admitted to the hospital 05/29/2019 for induction of labor.  Indication for induction: Gestational hypertension and TOLAC.  Patient had an uncomplicated labor course as follows: Membrane Rupture Time/Date: 9:45 PM ,05/29/2019   Intrapartum Procedures: Episiotomy: None [1]                                         Lacerations:    Patient had delivery of a Viable infant.  Information for the patient's newborn:  CrMica, Ramdass0[[id number]]    05/29/2019  Details of delivery can be found in separate delivery note.  Patient had a routine postpartum course. Labile BP to mild range and no neural s/sx, preeclampsia labs stable on admit. Patient started on Procardia 30xl on PPD1. Patient is discharged home in stable condition 05/31/19. Preeclampsia precautions reinforced, for close follow up in office in 1 week.   Physical exam  Vitals:   05/30/19 2020  05/30/19 2351 05/31/19 0510 05/31/19 1018  BP: 138/84 126/86 112/79 (!) 124/92  Pulse: 70  69 78  Resp: 18  18   Temp: 98.8 F (37.1 C)  98.2 F (36.8 C)   TempSrc: Oral  Oral   SpO2: 100%  100%   Weight:      Height:       General: alert, cooperative and no distress Lochia: appropriate Uterine Fundus: firm Incision: NA DVT Evaluation: No cords or calf tenderness. No significant calf/ankle edema. Labs: Lab Results  Component Value Date   WBC 10.2 05/30/2019   HGB 9.4 (L) 05/30/2019   HCT 27.3 (L) 05/30/2019   MCV 90.7 05/30/2019   PLT 254 05/30/2019   CMP Latest Ref Rng & Units 05/29/2019  Glucose 70 - 99 mg/dL 150(H)  BUN 6 - 20 mg/dL <5(L)  Creatinine 0.44 - 1.00 mg/dL 0.68  Sodium 135 - 145 mmol/L 136  Potassium 3.5 - 5.1 mmol/L 3.6  Chloride 98 - 111 mmol/L 105  CO2 22 - 32 mmol/L 18(L)  Calcium 8.9 - 10.3 mg/dL 8.7(L)  Total Protein 6.5 - 8.1 g/dL 5.8(L)  Total Bilirubin 0.3 - 1.2 mg/dL 0.6  Alkaline Phos 38 - 126 U/L 139(H)  AST 15 - 41 U/L 16  ALT 0 - 44 U/L 14    Discharge instruction: per After Visit Summary and "Baby and Me Booklet".  After visit meds:  Allergies as of 05/31/2019      Reactions   Sulfa Drugs Cross Reactors Hives, Swelling   Latex Rash      Medication List    STOP taking these medications   Blood Pressure Kit Devi   promethazine 25 MG tablet Commonly known as: PHENERGAN     TAKE these medications   acetaminophen 325 MG tablet Commonly known as: Tylenol Take 2 tablets (650 mg total) by mouth every 4 (four) hours as needed (for pain scale < 4).   benzocaine-Menthol 20-0.5 % Aero Commonly known as: DERMOPLAST Apply 1 application topically as needed for irritation (perineal discomfort).   coconut oil Oil Apply 1 application topically as needed.   ibuprofen 600 MG tablet Commonly known as: ADVIL Take 1 tablet (600 mg total) by mouth every 6 (six) hours.   IRON PO Take by mouth.   NIFEdipine 30 MG 24 hr tablet Commonly  known as: ADALAT CC Take 1 tablet (30 mg total) by mouth daily. Start taking on: June 01, 2019   PrePLUS 27-1 MG Tabs Take 1 tablet by mouth daily.            Discharge Care Instructions  (From admission, onward)         Start     Ordered   05/31/19 0000  Discharge wound care:    Comments: Sitz baths 2 times /day with warm water x 1 week. May add herbals: 1 ounce dried comfrey leaf* 1 ounce calendula flowers 1 ounce lavender flowers 1/2 ounce dried uva ursi leaves 1/2 ounce witch hazel blossoms (if you can find them) 1/2 ounce dried sage leaf 1/2 cup sea salt Directions: Bring 2 quarts of water to a boil. Turn off heat, and place 1 ounce (approximately 1 large handful) of the above mixed herbs (not the salt) into the pot. Steep, covered, for 30 minutes.  Strain the liquid well with a fine mesh strainer, and discard the herb material. Add 2 quarts of liquid to the tub, along with the 1/2 cup of salt. This medicinal liquid can also be made into compresses and peri-rinses.   05/31/19 1230          Diet: routine diet  Activity: Advance as tolerated. Pelvic rest for 6 weeks.   Outpatient follow up: Follow-up [facility]. Schedule an appointment as soon as possible for a visit in 1 week(s).   Specialty: Obstetrics and Gynecology Why: BP check  Contact information: [address] [place] [address] [phone]         Postpartum contraception: IUD Mirena  Newborn Data: Live born female  Birth Weight: 6 lb 8.6 oz (2965 g) APGAR: 8, 9  Newborn Delivery   Birth date/time: 05/29/2019 23:22:00 Delivery type: Vaginal, Spontaneous      Baby Feeding: Bottle and Breast Disposition:home with mother   05/31/2019 Juliene Pina, CNM

## 2019-06-01 NOTE — H&P (Signed)
[id number], IUP at 40.1 weeks, presenting for IOL. Pt endorse + Fm. Denies vaginal leakage. Denies vaginal bleeding. Denies feeling cxt's. Pt had elevated BP in the office and was sent for direct admission. Denies HA, epigastric pain, or visual changes.   Patient Active Problem List   Diagnosis Date Noted  . Encounter for induction of labor 05/29/2019  . History of pregnancy induced hypertension 10/23/2018  . 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 Supervision of high risk pregnancy, antepartum 10/02/2014  . Maternal multiple sclerosis   . PCOS (polycystic ovarian syndrome) 09/15/2012   Medications Prior to Admission  Medication Sig Dispense Refill Last Dose  . Blood Pressure Monitoring (BLOOD PRESSURE KIT) DEVI 1 Device by Does not apply route as needed. ICD 10: O09.90 1 Device 0   . Ferrous Sulfate (IRON PO) Take by mouth.     . Prenatal Vit-Fe Fumarate-FA (PREPLUS) 27-1 MG TABS Take 1 tablet by mouth daily.     . promethazine (PHENERGAN) 25 MG tablet Take 1 tablet (25 mg total) by mouth every 6 (six) hours as needed for nausea or vomiting. (Patient not taking: Reported on 04/22/2019) 30 tablet 0     Past Medical History:  Diagnosis Date  . Depression   . MS (multiple sclerosis) ([place])   . Pregnancy induced hypertension   . Sickle cell trait ([place])      No current facility-administered medications on file prior to encounter.   Current Outpatient Medications on File Prior to Encounter  Medication Sig Dispense Refill  . Blood Pressure Monitoring (BLOOD PRESSURE KIT) DEVI 1 Device by Does not apply route as needed. ICD 10: O09.90 1 Device 0  . Ferrous Sulfate (IRON PO) Take by mouth.    . Prenatal Vit-Fe Fumarate-FA (PREPLUS) 27-1 MG TABS Take 1 tablet by mouth daily.    . promethazine (PHENERGAN) 25 MG tablet Take 1 tablet (25 mg total) by mouth every 6 (six) hours as needed for nausea or vomiting. (Patient not taking: Reported on 04/22/2019) 30 tablet 0      Allergies  Allergen Reactions  . Sulfa Drugs Cross Reactors Hives and Swelling  . Latex Rash    History of present pregnancy: Pt Info/Preference:  Screening/Consents:  Labs:   EDD: Estimated Date of Delivery: 05/28/19  Establised: Patient's last menstrual period was 08/21/2018.  Anatomy Scan Date: 01/08/2019 Placenta Location: Anterior Genetic Screen Panoroma: Low risk female AFP: neg First Tri: Quad:  Office: [facility]            First PNV: 35w6dBlood Type  O+  Language: English Last PNV: 429w1dhogam  N/A  Flu Vaccine:  No   Antibody  neg  TDaP vaccine Yes   GTT: Early: 5.4 Third Trimester: 108  Feeding Plan: Breast/bottle BTL: No Rubella:  immune  Contraception: undecided VBAC: Yes RPR:  non-reactive  Circumcision: N/A   HBsAg:  negative  Pediatrician:  [facility]   HIV:   neg  Prenatal Classes: No Additional [place]2/17/2021 EFW 46% GBS: nge  (For PCN allergy, check sensitivities)       Chlamydia: neg    MFM Referral/Consult:  GC: neg  Support Person: FOB   PAP: 2020  Pain Management: epidural Neonatologist Referral:  Hgb Electrophoresis:  [place] trait  Birth Plan: No   Hgb NOB: 12.8    28W: 9.9   OB History    Gravida  7   Para  4   Term  4   Preterm  AB  2   Living  4     SAB  1   TAB      Ectopic      Multiple  0   Live Births  4          Past Medical History:  Diagnosis Date  . Depression   . MS (multiple sclerosis) ([place])   . Pregnancy induced hypertension   . Sickle cell trait [facility])    Past Surgical History:  Procedure Laterality Date  . CESAREAN SECTION    . DILATION AND EVACUATION N/A 03/06/2018   Procedure: DILATATION AND EVACUATION;  Surgeon: Osborne Oman, MD;  Location: [facility];  Service: Gynecology;  Laterality: N/A;  . FOOT SURGERY     Family History: family history includes Multiple sclerosis in her sister. Social History:  reports that she has been smoking cigarettes. She has a 1.25 pack-year smoking history. She has never used  smokeless tobacco. She reports previous alcohol use. She reports previous drug use. Drug: Marijuana.   Prenatal Transfer Tool  Maternal Diabetes: No Genetic Screening: Normal Maternal Ultrasounds/Referrals: Normal Fetal Ultrasounds or other Referrals:  None Maternal Substance Abuse:  No Significant Maternal Medications:  None Significant Maternal Lab Results: None  ROS:  Review of Systems  Constitutional: Negative for chills and fever.  HENT: Negative for congestion, sinus pain and sore throat.   Eyes: Negative for blurred vision.  Respiratory: Negative for cough and shortness of breath.   Cardiovascular: Negative for chest pain and leg swelling.  Gastrointestinal: Negative for abdominal pain, heartburn, nausea and vomiting.  Musculoskeletal: Negative for back pain.  Skin: Negative for rash.  Neurological: Negative for weakness and headaches.    Physical Exam: LMP 08/21/2018   Physical Exam  Constitutional: She is oriented to person, place, and time and well-developed, well-nourished, and in no distress.  HENT:  Head: Normocephalic.  Eyes: Pupils are equal, round, and reactive to light. Conjunctivae are normal.  Cardiovascular: Normal rate and regular rhythm.  Pulmonary/Chest: Effort normal.  Abdominal: Soft. Bowel sounds are normal.  Genitourinary:    Vagina, cervix and uterus normal.   Musculoskeletal:        General: Normal range of motion.     Cervical back: Normal range of motion.  Neurological: She is alert and oriented to person, place, and time.  Skin: Skin is warm and dry.  Psychiatric: Affect normal.    NST: FHR baseline 135 bpm, Variability: moderate, Accelerations:present, Decelerations:  Absent= Cat 1/Reactive UC:   none SVE: 3/50/-3, vertex verified by fetal sutures.  Leopold's: Position vertex, EFW 7.5lbs via leopold's.   Meds ordered this encounter  Medications  . lactated ringers infusion  . oxytocin (PITOCIN) IV BOLUS FROM BAG  . oxytocin  (PITOCIN) IV infusion 40 units in NS 1000 mL - Premix  . lactated ringers infusion 500-1,000 mL  . acetaminophen (TYLENOL) tablet 650 mg  . oxyCODONE-acetaminophen (PERCOCET/ROXICET) 5-325 MG per tablet 1 tablet  . oxyCODONE-acetaminophen (PERCOCET/ROXICET) 5-325 MG per tablet 2 tablet  . ondansetron (ZOFRAN) injection 4 mg  . sodium citrate-citric acid (ORACIT) solution 30 mL  . lidocaine (PF) (XYLOCAINE) 1 % injection 30 mL   Assessment/Plan: Cheryl Bright is a 34 y.o. female, [id number], IUP at 40.1 weeks, presenting for IOL.  FWB: Cat 1 Fetal Tracing.  Plan: Start Pitocin 2x2, will plan AROM when adequate contraction pattern Routine [facility] orders Pan med/epidural prn Anticipate labor progression and vaginal delivery.   Suzan Nailer, CNM, MSN 05/29/2019, 2:47  PM   H&P by Gavin Potters, CNM.  Copy and pasted from progress note in order to label as H&P.

## 2019-12-04 IMAGING — US US OB TRANSVAGINAL
1 series · 15 of 28 positions shown · non-contrast
Comparison: None.

CLINICAL DATA: Pregnancy of unknown location.

EXAM:
OBSTETRIC <14 WK ULTRASOUND
TECHNIQUE: Transabdominal ultrasound was performed for evaluation of the
gestation as well as the maternal uterus and adnexal regions.

[Series 1: us ob transvaginal · 15 of 29 slices shown]
[im 1/29]
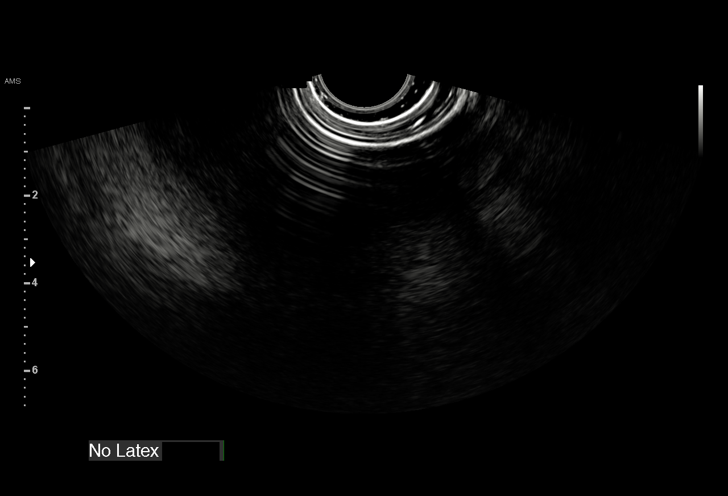
[im 3/29]
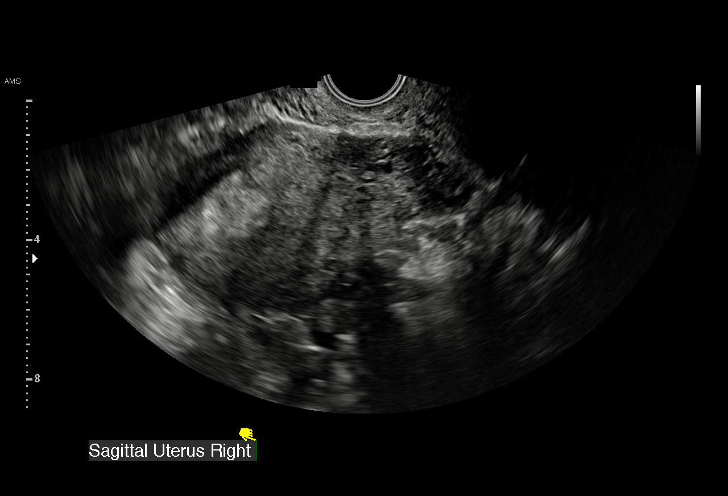
[im 5/29]
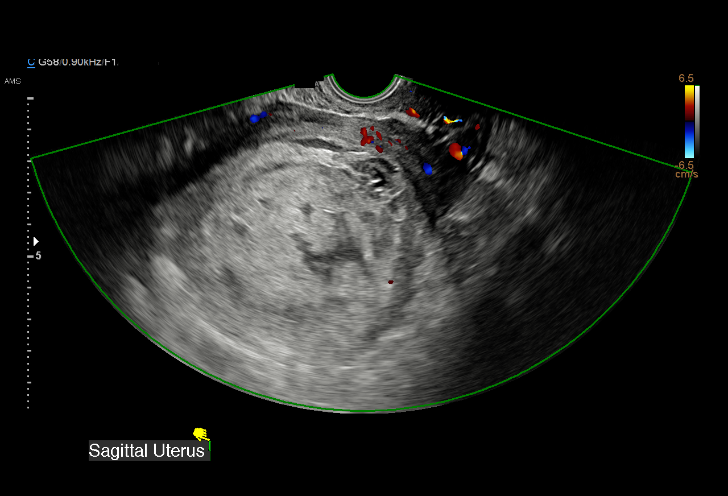
[im 7/29]
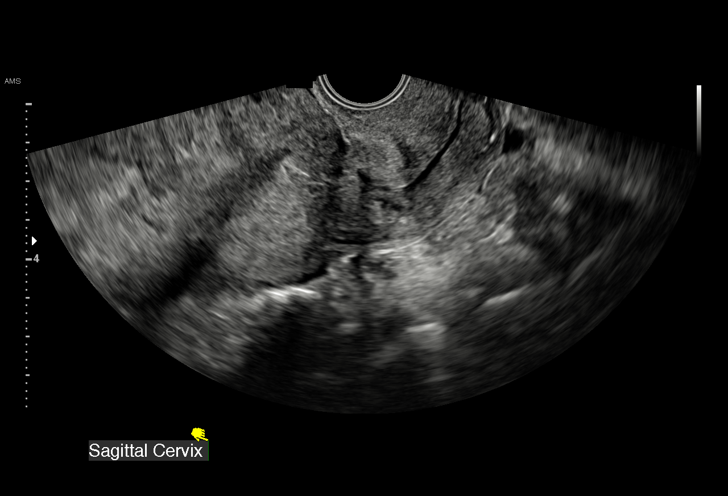
[im 9/29]
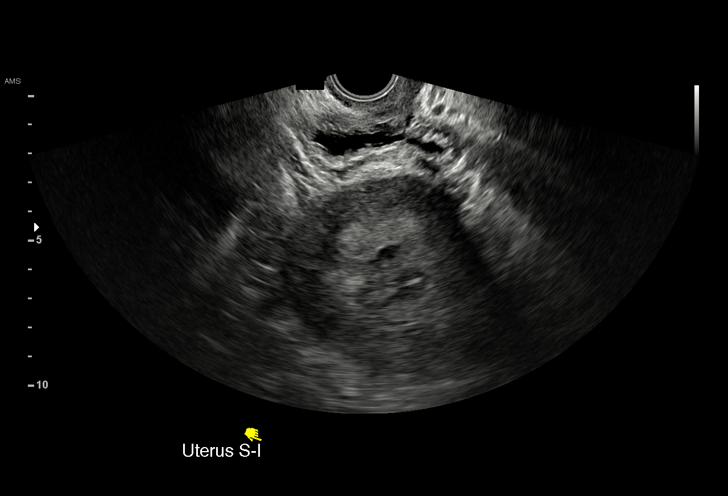
[im 11/29]
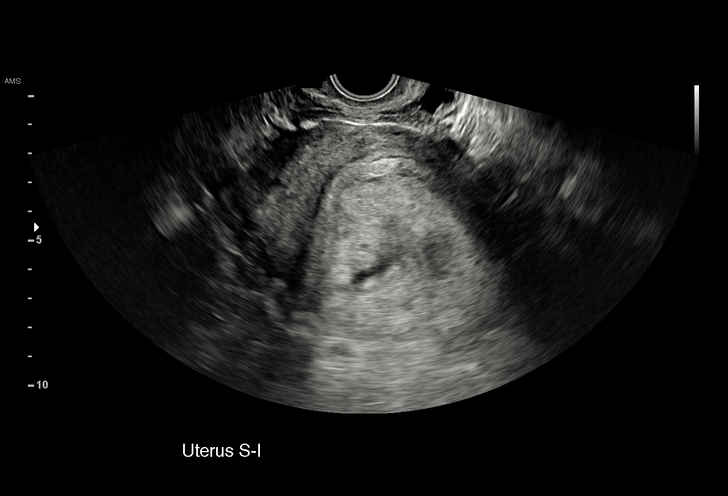
[im 13/29]
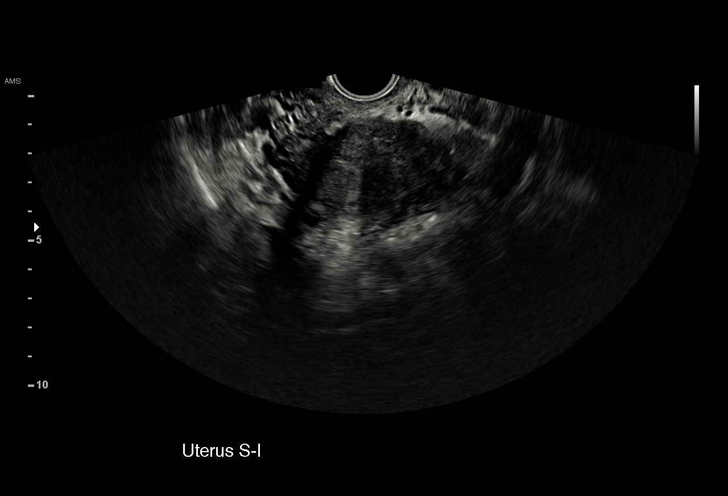
[im 15/29]
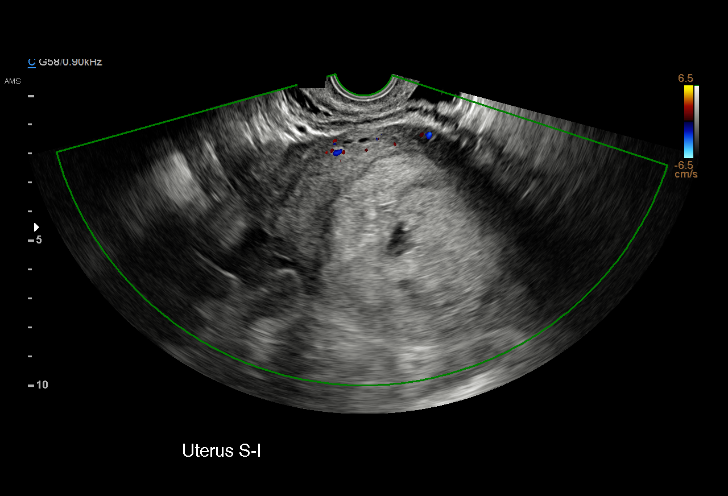
[im 16/29]
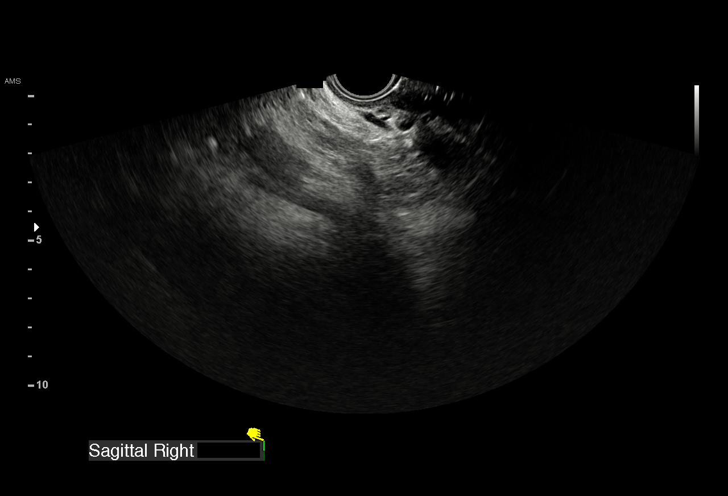
[im 18/29]
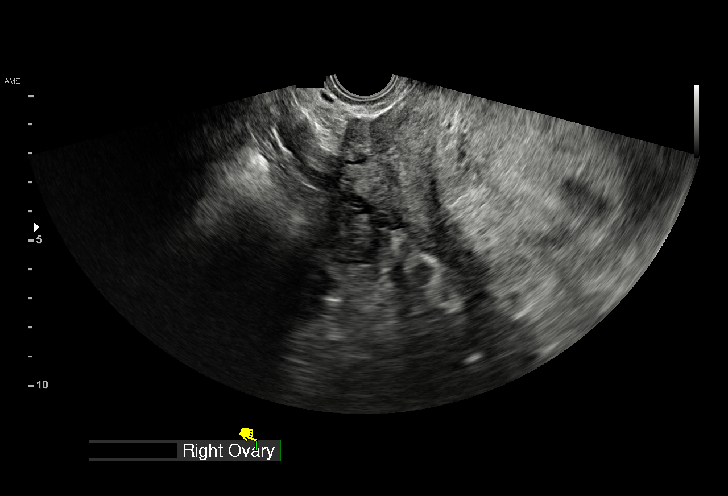
[im 20/29]
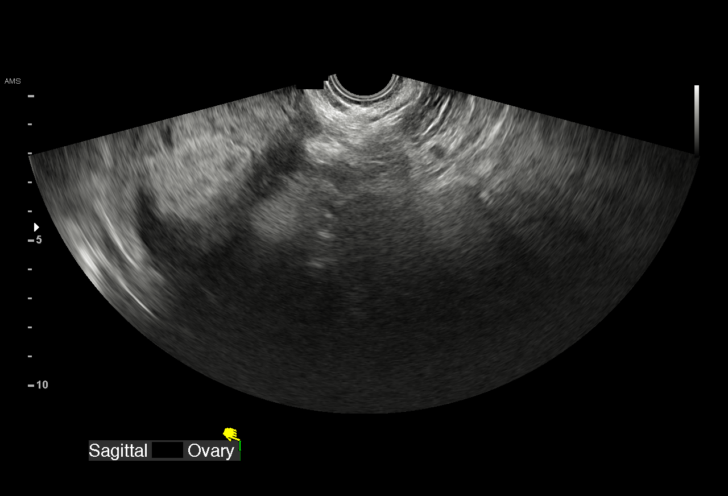
[im 22/29]
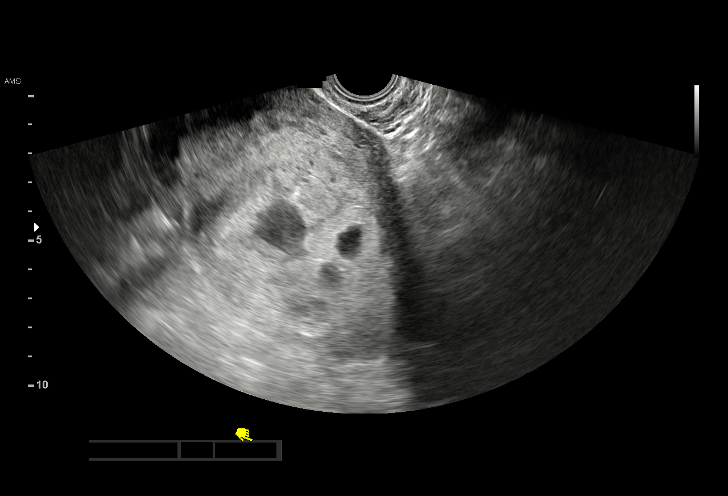
[im 24/29]
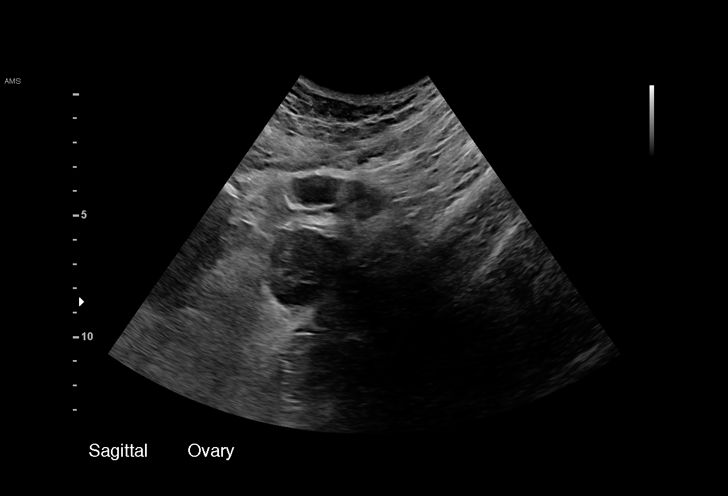
[im 26/29]
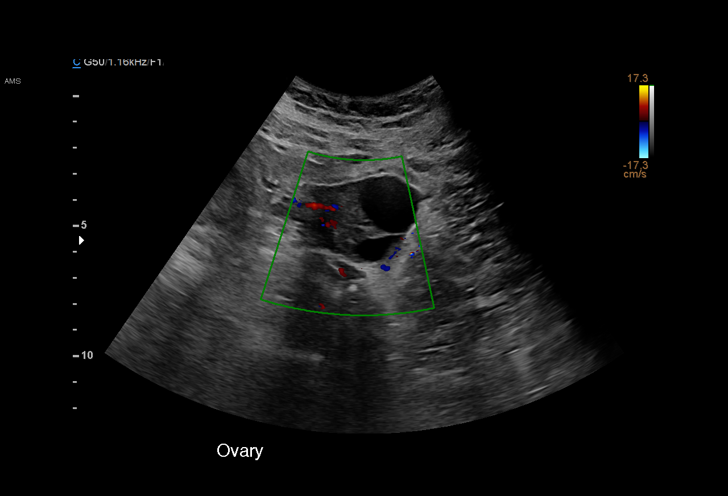
[im 29/29]
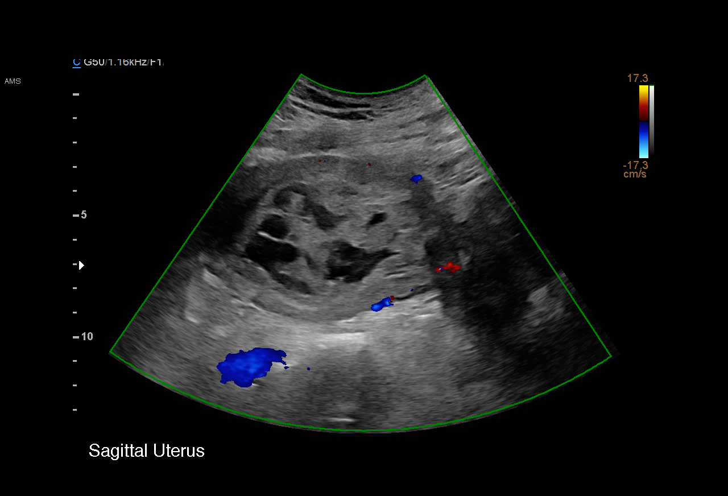

[15 of 28 positions shown; findings below may reference images not displayed]

FINDINGS: Intrauterine gestational sac: None

Yolk sac:  Not Visualized.

Embryo:  Not Visualized.

Cardiac Activity: Not Visualized.

Maternal uterus/adnexae: Normal right and left ovaries. No adnexal
mass. No free fluid. The endometrium is distended and contains mixed
solid and cystic material.
IMPRESSION: Mixed solid and cystic material within the endometrium raising the
possibility of gestational trophoblastic disease/molar pregnancy.

No live intrauterine gestation identified.

These results will be called to the ordering clinician or
representative by the Radiologist Assistant, and communication
documented in the PACS or zVision Dashboard.

## 2019-12-23 ENCOUNTER — Emergency Department (HOSPITAL_BASED_OUTPATIENT_CLINIC_OR_DEPARTMENT_OTHER)
Admission: EM | Admit: 2019-12-23 | Discharge: 2019-12-23 | Disposition: A | Discharge status: Home / Self Care | Payer: Medicaid Other | Attending: Emergency Medicine | Admitting: Emergency Medicine

## 2019-12-23 ENCOUNTER — Encounter (HOSPITAL_BASED_OUTPATIENT_CLINIC_OR_DEPARTMENT_OTHER): Payer: Self-pay

## 2019-12-23 ENCOUNTER — Other Ambulatory Visit: Payer: Self-pay

## 2019-12-23 DIAGNOSIS — F17210 Nicotine dependence, cigarettes, uncomplicated: Secondary | ICD-10-CM | POA: Diagnosis not present

## 2019-12-23 DIAGNOSIS — Z91040 Latex allergy status: Secondary | ICD-10-CM | POA: Diagnosis not present

## 2019-12-23 DIAGNOSIS — S3992XA Unspecified injury of lower back, initial encounter: Secondary | ICD-10-CM | POA: Diagnosis present

## 2019-12-23 DIAGNOSIS — Y92410 Unspecified street and highway as the place of occurrence of the external cause: Secondary | ICD-10-CM | POA: Diagnosis not present

## 2019-12-23 DIAGNOSIS — Z79899 Other long term (current) drug therapy: Secondary | ICD-10-CM | POA: Diagnosis not present

## 2019-12-23 DIAGNOSIS — S39012A Strain of muscle, fascia and tendon of lower back, initial encounter: Secondary | ICD-10-CM | POA: Insufficient documentation

## 2019-12-23 MED ORDER — CYCLOBENZAPRINE HCL 10 MG PO TABS: 5.0000 mg | ORAL_TABLET | Freq: Three times a day (TID) | ORAL | 0 refills | Status: DC | PRN
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
## 2019-12-23 NOTE — Discharge Instructions (Addendum)
You can take 600 mg of ibuprofen every 6 hours, you can take 1000 mg of Tylenol every 6 hours, you can alternate these every 3 or you can take them together.  

## 2020-05-02 ENCOUNTER — Encounter (HOSPITAL_COMMUNITY): Payer: Self-pay | Admitting: Family Medicine

## 2020-05-02 ENCOUNTER — Inpatient Hospital Stay (HOSPITAL_COMMUNITY)
Admission: AD | Admit: 2020-05-02 | Discharge: 2020-05-02 | Disposition: A | Discharge status: Home / Self Care | Payer: Medicaid Other | Attending: Family Medicine | Admitting: Family Medicine

## 2020-05-02 ENCOUNTER — Other Ambulatory Visit: Payer: Self-pay

## 2020-05-02 ENCOUNTER — Inpatient Hospital Stay (HOSPITAL_COMMUNITY): Payer: Medicaid Other

## 2020-05-02 DIAGNOSIS — G35 Multiple sclerosis: Secondary | ICD-10-CM | POA: Diagnosis not present

## 2020-05-02 DIAGNOSIS — O200 Threatened abortion: Secondary | ICD-10-CM | POA: Diagnosis present

## 2020-05-02 DIAGNOSIS — Z3A01 Less than 8 weeks gestation of pregnancy: Secondary | ICD-10-CM

## 2020-05-02 DIAGNOSIS — O99331 Smoking (tobacco) complicating pregnancy, first trimester: Secondary | ICD-10-CM | POA: Diagnosis not present

## 2020-05-02 DIAGNOSIS — O4691 Antepartum hemorrhage, unspecified, first trimester: Secondary | ICD-10-CM | POA: Diagnosis not present

## 2020-05-02 DIAGNOSIS — O99351 Diseases of the nervous system complicating pregnancy, first trimester: Secondary | ICD-10-CM | POA: Diagnosis not present

## 2020-05-02 DIAGNOSIS — D573 Sickle-cell trait: Secondary | ICD-10-CM | POA: Diagnosis not present

## 2020-05-02 DIAGNOSIS — F17210 Nicotine dependence, cigarettes, uncomplicated: Secondary | ICD-10-CM | POA: Insufficient documentation

## 2020-05-02 DIAGNOSIS — O209 Hemorrhage in early pregnancy, unspecified: Secondary | ICD-10-CM

## 2020-05-02 LAB — CBC
HCT: 36.9 % (ref 36.0–46.0)
Hemoglobin: 13.2 g/dL (ref 12.0–15.0)
MCH: 31.6 pg (ref 26.0–34.0)
MCHC: 35.8 g/dL (ref 30.0–36.0)
MCV: 88.3 fL (ref 80.0–100.0)
Platelets: 279 10*3/uL (ref 150–400)
RBC: 4.18 MIL/uL (ref 3.87–5.11)
RDW: 14 % (ref 11.5–15.5)
WBC: 7.1 10*3/uL (ref 4.0–10.5)
nRBC: 0 % (ref 0.0–0.2)

## 2020-05-02 LAB — URINALYSIS, ROUTINE W REFLEX MICROSCOPIC
Bacteria, UA: NONE SEEN
Bilirubin Urine: NEGATIVE
Glucose, UA: NEGATIVE mg/dL
Hgb urine dipstick: NEGATIVE
Ketones, ur: NEGATIVE mg/dL
Leukocytes,Ua: NEGATIVE
Nitrite: NEGATIVE
Protein, ur: NEGATIVE mg/dL
Specific Gravity, Urine: 1.011 (ref 1.005–1.030)
pH: 6 (ref 5.0–8.0)

## 2020-05-02 LAB — HCG, QUANTITATIVE, PREGNANCY: hCG, Beta Chain, Quant, S: 103959 m[IU]/mL — ABNORMAL HIGH (ref <5)

## 2020-05-02 LAB — POCT PREGNANCY, URINE: Preg Test, Ur: POSITIVE — AB

## 2020-05-02 NOTE — MAU Note (Addendum)
Pt reports positive HPT earlier this month. LMP was 03/15/2020.  Pt reports intermittent vaginal bleeding since last week.Also reporting dull pain at night off and on. No current pain and no vaginal discharge.

## 2020-05-02 NOTE — MAU Provider Note (Signed)
History     CSN: [id number]  Arrival date and time: 05/02/20 1220   Event Date/Time   First Provider Initiated Contact with Patient 05/02/20 1351      Chief Complaint  Patient presents with  . Vaginal Bleeding   HPI Patient is a 35 yo [id number] at [redacted]w[redacted]d by LMP. She presents with vaginal bleeding that started last Tuesday. She has had a few episodes of bleeding since then, along with some intermittent lower pelvic cramping. No radiation to her pain. Describes current bleeding as spotting. Some nausea, but no fevers, chills. No palliating or provoking factors.  OB History    Gravida  8   Para  5   Term  5   Preterm      AB  2   Living  5     SAB  1   IAB      Ectopic      Multiple  0   Live Births  5           Past Medical History:  Diagnosis Date  . Depression   . MS (multiple sclerosis) (HCC)   . Pregnancy induced hypertension   . Sickle cell trait [facility])     Past Surgical History:  Procedure Laterality Date  . CESAREAN SECTION    . DILATION AND EVACUATION N/A 03/06/2018   Procedure: DILATATION AND EVACUATION;  Surgeon: Tereso Newcomer, MD;  Location: [facility];  Service: Gynecology;  Laterality: N/A;  . FOOT SURGERY      Family History  Problem Relation Age of Onset  . Multiple sclerosis Sister     Social History   Tobacco Use  . Smoking status: Current Every Day Smoker    Packs/day: 0.50    Years: 5.00    Pack years: 2.50    Types: Cigarettes  . Smokeless tobacco: Never Used  Vaping Use  . Vaping Use: Never used  Substance Use Topics  . Alcohol use: Yes    Comment: weekly  . Drug use: Yes    Types: Marijuana    Allergies:  Allergies  Allergen Reactions  . Sulfa Drugs Cross Reactors Hives and Swelling  . Latex Rash    Medications Prior to Admission  Medication Sig Dispense Refill Last Dose  . acetaminophen (TYLENOL) 325 MG tablet Take 2 tablets (650 mg total) by mouth every 4 (four) hours as needed (for pain scale < 4).    Unknown at Unknown time  . benzocaine-Menthol (DERMOPLAST) 20-0.5 % AERO Apply 1 application topically as needed for irritation (perineal discomfort).   Unknown at Unknown time  . coconut oil OIL Apply 1 application topically as needed.  0 Unknown at Unknown time  . cyclobenzaprine (FLEXERIL) 10 MG tablet Take 0.5 tablets (5 mg total) by mouth 3 (three) times daily as needed for up to 15 doses for muscle spasms. 7.5 tablet 0 Unknown at Unknown time  . Ferrous Sulfate (IRON PO) Take by mouth.   Unknown at Unknown time  . ibuprofen (ADVIL) 600 MG tablet Take 1 tablet (600 mg total) by mouth every 6 (six) hours. 30 tablet 0 Unknown at Unknown time  . NIFEdipine (ADALAT CC) 30 MG 24 hr tablet Take 1 tablet (30 mg total) by mouth daily. 30 tablet 0 Unknown at Unknown time  . Prenatal Vit-Fe Fumarate-FA (PREPLUS) 27-1 MG TABS Take 1 tablet by mouth daily.   Unknown at Unknown time    Review of Systems  All other systems reviewed and are negative.  Physical Exam   Blood pressure 136/82, pulse 78, temperature 98.2 F (36.8 C), resp. rate 17, height 5\' 2"  (1.575 m), weight 95.2 kg, last menstrual period 03/15/2020, SpO2 100 %, unknown if currently breastfeeding.  Physical Exam Vitals and nursing note reviewed.  Constitutional:      Appearance: Normal appearance.  HENT:     Head: Normocephalic and atraumatic.  Cardiovascular:     Rate and Rhythm: Normal rate and regular rhythm.     Pulses: Normal pulses.     Heart sounds: Normal heart sounds.  Pulmonary:     Effort: Pulmonary effort is normal.     Breath sounds: Normal breath sounds.  Abdominal:     General: Abdomen is flat. Bowel sounds are normal. There is no distension.     Palpations: Abdomen is soft. There is no mass.     Tenderness: There is no abdominal tenderness. There is no guarding or rebound.     Hernia: No hernia is present.  Skin:    General: Skin is warm and dry.     Capillary Refill: Capillary refill takes less than 2  seconds.  Neurological:     General: No focal deficit present.     Mental Status: She is alert.  Psychiatric:        Mood and Affect: Mood normal.        Behavior: Behavior normal.        Thought Content: Thought content normal.        Judgment: Judgment normal.     Results for orders placed or performed during the hospital encounter of 05/02/20 (from the past 24 hour(s))  Pregnancy, urine POC     Status: Abnormal   Collection Time: 05/02/20 12:31 PM  Result Value Ref Range   Preg Test, Ur POSITIVE (A) NEGATIVE  Urinalysis, Routine w reflex microscopic Urine, Clean Catch     Status: Abnormal   Collection Time: 05/02/20  1:18 PM  Result Value Ref Range   Color, Urine STRAW (A) YELLOW   APPearance CLEAR CLEAR   Specific Gravity, Urine 1.011 1.005 - 1.030   pH 6.0 5.0 - 8.0   Glucose, UA NEGATIVE NEGATIVE mg/dL   Hgb urine dipstick NEGATIVE NEGATIVE   Bilirubin Urine NEGATIVE NEGATIVE   Ketones, ur NEGATIVE NEGATIVE mg/dL   Protein, ur NEGATIVE NEGATIVE mg/dL   Nitrite NEGATIVE NEGATIVE   Leukocytes,Ua NEGATIVE NEGATIVE   RBC / HPF 0-5 0 - 5 RBC/hpf   WBC, UA 0-5 0 - 5 WBC/hpf   Bacteria, UA NONE SEEN NONE SEEN   Squamous Epithelial / LPF 0-5 0 - 5   Mucus PRESENT   ABO/Rh     Status: None   Collection Time: 05/02/20  2:00 PM  Result Value Ref Range   ABO/RH(D)      O POS Performed at [facility], [address] [place], [place]   CBC     Status: None   Collection Time: 05/02/20  2:00 PM  Result Value Ref Range   WBC 7.1 4.0 - 10.5 K/uL   RBC 4.18 3.87 - 5.11 MIL/uL   Hemoglobin 13.2 12.0 - 15.0 g/dL   HCT 05/04/20 94.7 - 09.6 %   MCV 88.3 80.0 - 100.0 fL   MCH 31.6 26.0 - 34.0 pg   MCHC 35.8 30.0 - 36.0 g/dL   RDW 28.3 66.2 - 94.7 %   Platelets 279 150 - 400 K/uL   nRBC 0.0 0.0 - 0.2 %  hCG, quantitative, pregnancy     Status: Abnormal   Collection Time: 05/02/20  2:00 PM  Result Value Ref Range   hCG, Beta Chain, Quant, S 103,959 (H) <5 mIU/mL    US OB LESS THAN 14 WEEKS WITH OB TRANSVAGINAL  Result Date: 05/02/2020 CLINICAL DATA:  Vaginal bleeding and positive pregnancy test EXAM: OBSTETRIC <14 WK [place] AND TRANSVAGINAL OB US TECHNIQUE: Both transabdominal and transvaginal ultrasound examinations were performed for complete evaluation of the gestation as well as the maternal uterus, adnexal regions, and pelvic cul-de-sac. Transvaginal technique was performed to assess early pregnancy. COMPARISON:  None. FINDINGS: Intrauterine gestational sac: Present Yolk sac:  Present Embryo:  Present Cardiac Activity: Present Heart Rate: 130 bpm CRL:  10.4 mm   7 w   1 d                  [place] EDC: 12/18/2020 Subchorionic hemorrhage:  None visualized. Maternal uterus/adnexae: Right ovary is not well visualized. Left ovary is within normal limits. IMPRESSION: Single live intrauterine gestation at 7 weeks 1 day. No complicating factors are noted. Electronically Signed   By: Alcide Clever M.D.   On: 05/02/2020 14:50      MAU Course  Procedures  MDM Ectopic r/o with [place] that showed IUP with FHR of 130. No subchorionic hematoma visualized.   Assessment and Plan     ICD-10-CM   1. [redacted] weeks gestation of pregnancy  Z3A.01   2. Bleeding in early pregnancy  O20.9 US OB LESS THAN 14 WEEKS WITH OB TRANSVAGINAL    US OB LESS THAN 14 WEEKS WITH OB TRANSVAGINAL  3. Vaginal bleeding affecting early pregnancy  O20.9   4. Threatened miscarriage in early pregnancy  O20.0    Precautions given.  Start OB care. Return precautions given.  Levie Heritage 05/02/2020, 3:17 PM

## 2020-05-02 NOTE — Discharge Instructions (Signed)
Vaginal Bleeding During Pregnancy, First Trimester A small amount of bleeding from the vagina is common during early pregnancy. This kind of bleeding is also called spotting. Sometimes the bleeding is normal and does not cause problems. At other times, though, bleeding may be a sign of something serious. Normal bleeding in pregnancy can happen:  When the fertilized egg attaches itself to your womb.  When blood vessels change because of the pregnancy.  When you have pelvic exams.  When you have sex. Abnormal bleeding can happen:  When you have an infection.  When you have growths in your womb. The growths are called polyps.  If you are having a miscarriage or at risk of having one.  If you have other problems in your pregnancy. Tell your doctor right away about any bleeding from your vagina. Follow these instructions at home: Watch your bleeding  Watch your condition for any changes. Let your doctor know if you are worried about something.  Try to know what causes your bleeding. Ask yourself these questions: ? Does the bleeding start on its own? ? Does the bleeding start after something is done, such as sex or a pelvic exam?  Use a diary to write the things you see about your bleeding. Write in your diary: ? If the bleeding flows freely without stopping, or if it starts and stops, and then starts again. ? If the bleeding is heavy or light. ? How many pads you use in a day and how much blood is in them.  Tell your doctor if you pass tissue. He or she may want to see it.   Activity  Follow your doctor's instructions about how active you can be. Ask what activities are safe for you.  Do not have sex or orgasms until your doctor says that this is safe.  If needed, make plans for someone to help with your normal activities. General instructions  Take over-the-counter and prescription medicines only as told by your doctor.  Do not take aspirin because it can cause  bleeding.  Do not use tampons.  Do not douche.  Keep all follow-up visits. Contact a doctor if:  You have vaginal bleeding at any time while you are pregnant.  You have cramps.  You have a fever or chills. Get help right away if:  You have very bad cramps in your back or belly (abdomen).  You pass large clots or a lot of tissue from your vagina.  Your bleeding gets worse.  You feel light-headed.  You feel weak.  You pass out (faint).  You have chills.  You are leaking fluid from your vagina.  You have a gush of fluid from your vagina. Summary  Sometimes vaginal bleeding during pregnancy is normal and does not cause problems. At other times, bleeding may be a sign of something serious.  Tell your doctor right away about any bleeding from your vagina.  Follow your doctor's instructions about how active you can be. You may need someone to help you with your normal activities.  Keep all follow-up visits. This information is not intended to replace advice given to you by your health care provider. Make sure you discuss any questions you have with your health care provider. Document Revised: 11/12/2019 Document Reviewed: 11/12/2019 Elsevier Patient Education  2021 [employer] Inc[employer]  

## 2020-05-03 LAB — ABO/RH: ABO/RH(D): O POS

## 2021-04-16 ENCOUNTER — Emergency Department (HOSPITAL_BASED_OUTPATIENT_CLINIC_OR_DEPARTMENT_OTHER)
Admission: EM | Admit: 2021-04-16 | Discharge: 2021-04-16 | Disposition: A | Discharge status: Home / Self Care | Payer: Medicaid Other | Attending: Emergency Medicine | Admitting: Emergency Medicine

## 2021-04-16 ENCOUNTER — Other Ambulatory Visit: Payer: Self-pay

## 2021-04-16 ENCOUNTER — Encounter (HOSPITAL_BASED_OUTPATIENT_CLINIC_OR_DEPARTMENT_OTHER): Payer: Self-pay

## 2021-04-16 DIAGNOSIS — J029 Acute pharyngitis, unspecified: Secondary | ICD-10-CM | POA: Insufficient documentation

## 2021-04-16 DIAGNOSIS — Z91040 Latex allergy status: Secondary | ICD-10-CM | POA: Insufficient documentation

## 2021-04-16 DIAGNOSIS — J069 Acute upper respiratory infection, unspecified: Secondary | ICD-10-CM | POA: Insufficient documentation

## 2021-04-16 DIAGNOSIS — Z20822 Contact with and (suspected) exposure to covid-19: Secondary | ICD-10-CM | POA: Diagnosis not present

## 2021-04-16 LAB — RESP PANEL BY RT-PCR (FLU A&B, COVID) ARPGX2
Influenza A by PCR: NEGATIVE
Influenza B by PCR: NEGATIVE
SARS Coronavirus 2 by RT PCR: NEGATIVE

## 2021-04-16 LAB — GROUP A STREP BY PCR: Group A Strep by PCR: NOT DETECTED

## 2021-04-16 NOTE — ED Triage Notes (Signed)
Sore throat for a day.

## 2021-04-16 NOTE — ED Provider Notes (Signed)
[facility] EMERGENCY DEPARTMENT Provider Note   CSN: [id number] Arrival date & time: 04/16/21  0841     History  Chief Complaint  Patient presents with   Sore Throat    Cheryl Bright is a 36 y.o. female.  HPI     36yo female presents with concern for sore throat. Daughter is also here with fever, congestion.  SYmptoms started today. Pain with swallowing. Some congestion> no significant cough. No known fever. No nausea, vomiting, diarrhea, urinary symptoms, abdominal pain.   Home Medications Prior to Admission medications   Medication Sig Start Date End Date Taking? Authorizing Provider  Prenatal Vit-Fe Fumarate-FA (PREPLUS) 27-1 MG TABS Take 1 tablet by mouth daily.    [provider]      Allergies    Sulfa drugs cross reactors and Latex    Review of Systems   Review of Systems  Physical Exam Updated Vital Signs BP (!) 154/105 (BP Location: Right Arm)    Pulse 76    Temp (!) 97.5 F (36.4 C) (Oral)    Resp 18    Ht 5\' 2"  (1.575 m)    Wt 96.2 kg    SpO2 97%    BMI 38.78 kg/m  Physical Exam Vitals and nursing note reviewed.  Constitutional:      General: She is not in acute distress.    Appearance: She is well-developed. She is not diaphoretic.  HENT:     Head: Normocephalic and atraumatic.     Mouth/Throat:     Pharynx: No oropharyngeal exudate or posterior oropharyngeal erythema.  Eyes:     Conjunctiva/sclera: Conjunctivae normal.  Cardiovascular:     Rate and Rhythm: Normal rate and regular rhythm.     Heart sounds: Normal heart sounds. No murmur heard.   No friction rub. No gallop.  Pulmonary:     Effort: Pulmonary effort is normal. No respiratory distress.     Breath sounds: Normal breath sounds. No wheezing or rales.  Abdominal:     General: There is no distension.     Palpations: Abdomen is soft.     Tenderness: There is no abdominal tenderness. There is no guarding.  Musculoskeletal:        General: No tenderness.     Cervical  back: Normal range of motion.  Skin:    General: Skin is warm and dry.     Findings: No erythema or rash.  Neurological:     Mental Status: She is alert and oriented to person, place, and time.    ED Results / Procedures / Treatments   Labs (all labs ordered are listed, but only abnormal results are displayed) Labs Reviewed  GROUP A STREP BY PCR  RESP PANEL BY RT-PCR (FLU A&B, COVID) [id number]    EKG None  Radiology No results found.  Procedures Procedures    Medications Ordered in ED Medications - No data to display  ED Course/ Medical Decision Making/ A&P                           Medical Decision Making   36yo female presents with concern for sore throat. Daughter is also here with fever, congestion.  No sign of RPA, PTA, epiglottitis.  Strep testing negative. COVIE/flu testing negative. Suspect other viral pharyngitis. Recommend continued supportive care. Patient discharged in stable condition with understanding of reasons to return.         Final Clinical Impression(s) / ED  Diagnoses Final diagnoses:  Pharyngitis, unspecified etiology  Upper respiratory tract infection, unspecified type    Rx / DC Orders ED Discharge Orders     None         Alvira Monday, MD 04/16/21 2359

## 2021-10-24 ENCOUNTER — Encounter (HOSPITAL_BASED_OUTPATIENT_CLINIC_OR_DEPARTMENT_OTHER): Payer: Self-pay | Admitting: Emergency Medicine

## 2021-10-24 ENCOUNTER — Emergency Department (HOSPITAL_BASED_OUTPATIENT_CLINIC_OR_DEPARTMENT_OTHER)
Admission: EM | Admit: 2021-10-24 | Discharge: 2021-10-24 | Discharge status: Against Medical Advice | Payer: Medicaid Other | Attending: Emergency Medicine | Admitting: Emergency Medicine

## 2021-10-24 DIAGNOSIS — M545 Low back pain, unspecified: Secondary | ICD-10-CM | POA: Diagnosis not present

## 2021-10-24 DIAGNOSIS — Z5321 Procedure and treatment not carried out due to patient leaving prior to being seen by health care provider: Secondary | ICD-10-CM | POA: Diagnosis not present

## 2021-10-24 NOTE — ED Triage Notes (Signed)
Patient reports lower back pain X1 week. Denies injury. Ambulatory. Has been taking Tylneol for pain, last dose last night.

## 2021-10-25 ENCOUNTER — Inpatient Hospital Stay (HOSPITAL_COMMUNITY)
Admission: AD | Admit: 2021-10-25 | Discharge: 2021-10-25 | Disposition: A | Discharge status: Home / Self Care | Payer: Medicaid Other | Attending: Obstetrics and Gynecology | Admitting: Obstetrics and Gynecology

## 2021-10-25 ENCOUNTER — Other Ambulatory Visit: Payer: Self-pay

## 2021-10-25 ENCOUNTER — Inpatient Hospital Stay (HOSPITAL_COMMUNITY): Payer: Medicaid Other

## 2021-10-25 ENCOUNTER — Encounter (HOSPITAL_COMMUNITY): Payer: Self-pay

## 2021-10-25 DIAGNOSIS — O2341 Unspecified infection of urinary tract in pregnancy, first trimester: Secondary | ICD-10-CM | POA: Diagnosis not present

## 2021-10-25 DIAGNOSIS — Z3A01 Less than 8 weeks gestation of pregnancy: Secondary | ICD-10-CM | POA: Insufficient documentation

## 2021-10-25 DIAGNOSIS — R109 Unspecified abdominal pain: Secondary | ICD-10-CM

## 2021-10-25 DIAGNOSIS — O99331 Smoking (tobacco) complicating pregnancy, first trimester: Secondary | ICD-10-CM | POA: Diagnosis not present

## 2021-10-25 DIAGNOSIS — O26891 Other specified pregnancy related conditions, first trimester: Secondary | ICD-10-CM | POA: Diagnosis present

## 2021-10-25 DIAGNOSIS — O09521 Supervision of elderly multigravida, first trimester: Secondary | ICD-10-CM | POA: Insufficient documentation

## 2021-10-25 DIAGNOSIS — O99011 Anemia complicating pregnancy, first trimester: Secondary | ICD-10-CM | POA: Insufficient documentation

## 2021-10-25 DIAGNOSIS — O0941 Supervision of pregnancy with grand multiparity, first trimester: Secondary | ICD-10-CM | POA: Insufficient documentation

## 2021-10-25 DIAGNOSIS — R110 Nausea: Secondary | ICD-10-CM | POA: Diagnosis not present

## 2021-10-25 DIAGNOSIS — O99351 Diseases of the nervous system complicating pregnancy, first trimester: Secondary | ICD-10-CM | POA: Diagnosis not present

## 2021-10-25 DIAGNOSIS — M545 Low back pain, unspecified: Secondary | ICD-10-CM | POA: Diagnosis not present

## 2021-10-25 DIAGNOSIS — O99891 Other specified diseases and conditions complicating pregnancy: Secondary | ICD-10-CM | POA: Insufficient documentation

## 2021-10-25 DIAGNOSIS — O99352 Diseases of the nervous system complicating pregnancy, second trimester: Secondary | ICD-10-CM | POA: Diagnosis not present

## 2021-10-25 DIAGNOSIS — F17210 Nicotine dependence, cigarettes, uncomplicated: Secondary | ICD-10-CM | POA: Diagnosis not present

## 2021-10-25 LAB — URINALYSIS, ROUTINE W REFLEX MICROSCOPIC
Bilirubin Urine: NEGATIVE
Glucose, UA: NEGATIVE mg/dL
Ketones, ur: NEGATIVE mg/dL
Leukocytes,Ua: NEGATIVE
Nitrite: POSITIVE — AB
Protein, ur: NEGATIVE mg/dL
Specific Gravity, Urine: 1.011 (ref 1.005–1.030)
pH: 6 (ref 5.0–8.0)

## 2021-10-25 LAB — CBC
HCT: 39.3 % (ref 36.0–46.0)
Hemoglobin: 13.6 g/dL (ref 12.0–15.0)
MCH: 30.8 pg (ref 26.0–34.0)
MCHC: 34.6 g/dL (ref 30.0–36.0)
MCV: 89.1 fL (ref 80.0–100.0)
Platelets: 270 10*3/uL (ref 150–400)
RBC: 4.41 MIL/uL (ref 3.87–5.11)
RDW: 14.1 % (ref 11.5–15.5)
WBC: 6.6 10*3/uL (ref 4.0–10.5)
nRBC: 0 % (ref 0.0–0.2)

## 2021-10-25 LAB — WET PREP, GENITAL
Clue Cells Wet Prep HPF POC: NONE SEEN
Sperm: NONE SEEN
Trich, Wet Prep: NONE SEEN
WBC, Wet Prep HPF POC: <10 (ref <10)
Yeast Wet Prep HPF POC: NONE SEEN

## 2021-10-25 LAB — POCT PREGNANCY, URINE: Preg Test, Ur: POSITIVE — AB

## 2021-10-25 LAB — HCG, QUANTITATIVE, PREGNANCY: hCG, Beta Chain, Quant, S: 24574 m[IU]/mL — ABNORMAL HIGH (ref <5)

## 2021-10-25 MED ORDER — CEFADROXIL 500 MG PO CAPS
500.0000 mg | ORAL_CAPSULE | Freq: Two times a day (BID) | ORAL | 0 refills | Status: AC
Start: 2021-10-25 — End: 2021-11-01

## 2021-10-25 NOTE — MAU Note (Signed)
Cheryl Bright is a 36 y.o. at [redacted]w[redacted]d here in MAU reporting: lower back and right lower abdominal pain. Back pain has been ongoing for a week. Abdominal pain has been going on for a few days. No bleeding or discharge. Took ibuprofen for pain a couple days ago with some relief.   LMP: 09/09/2021  Onset of complaint: ongoing  Pain score: back 5/10, abdomen 5/10  Vitals:   10/25/21 1052  BP: 127/76  Pulse: 79  Resp: 16  Temp: 98.6 F (37 C)  SpO2: 98%     Lab orders placed from triage: ua, upt

## 2021-10-25 NOTE — Discharge Instructions (Signed)
[facility]   [facility]             [address], [place], [place] [address] [phone]  [facility]                                                             [address], [address], [place], [place], [address] [phone]  [facility]                                    [address], [address], [place], [place], [address] [phone]  [facility] [place] [address], [address], [place], [place], [address] [phone]  [facility]                                 [address], [place], [place], [address] [phone]  [facility]                                    [address], [place], [place], [address] [phone]  [facility] [address], [address], [place], [place], [address]                              [facility] [address], [address], [place], [place], [address] [phone]  [facility]         Phone: [phone]  [facility]      Phone: [phone]   [facility]      Phone: [phone]  [facility]         Phone: [phone]   [facility]    Phone: [phone]  [facility]      Phone: [phone]  [facility]     Phone: [phone]  [facility]        Phone: [phone]  [facility] (Sheronette Cousins) [phone]  [facility]      Phone: [phone]   

## 2021-10-25 NOTE — MAU Provider Note (Addendum)
History     CSN: [id number]  Arrival date and time: 10/25/21 1029   Event Date/Time   First Provider Initiated Contact with Patient 10/25/21 1150      Chief Complaint  Patient presents with   Abdominal Pain   Back Pain   Cheryl Bright is a 36 y.o. female [phone] at [redacted]w[redacted]d by LMP presents for lower back pain and right sided abdominal cramping for approximately 1 week. She states that the pain is intermittent and occurs a few times per day. She states she has not noticed anything that brings the pain on or makes the pain worse. She states that Ibuprofen, resting, and massages help the pain but do not resolve it. She reports some nausea but denies vomiting. She denies vaginal discharge, bleeding, dysuria, or changes in frequency.  Abdominal Pain Pertinent negatives include no dysuria, fever, nausea or vomiting.  Back Pain Associated symptoms include abdominal pain. Pertinent negatives include no chest pain, dysuria or fever.    OB History     Gravida  9   Para  5   Term  5   Preterm      AB  3   Living  5      SAB  2   IAB      Ectopic      Multiple  0   Live Births  5           Past Medical History:  Diagnosis Date   Depression    MS (multiple sclerosis) (HCC)    Pregnancy induced hypertension    Sickle cell trait (HCC)     Past Surgical History:  Procedure Laterality Date   CESAREAN SECTION     DILATION AND EVACUATION N/A 03/06/2018   Procedure: DILATATION AND EVACUATION;  Surgeon: Tereso Newcomer, MD;  Location: [facility];  Service: Gynecology;  Laterality: N/A;   FOOT SURGERY      Family History  Problem Relation Age of Onset   Multiple sclerosis Sister     Social History   Tobacco Use   Smoking status: Every Day    Packs/day: 0.50    Years: 5.00    Total pack years: 2.50    Types: Cigarettes   Smokeless tobacco: Never  Vaping Use   Vaping Use: Never used  Substance Use Topics   Alcohol use: Yes    Comment: Socially   Drug use: Yes     Types: Marijuana    Allergies:  Allergies  Allergen Reactions   Sulfa Drugs Cross Reactors Hives and Swelling   Latex Rash    Medications Prior to Admission  Medication Sig Dispense Refill Last Dose   Prenatal Vit-Fe Fumarate-FA (PREPLUS) 27-1 MG TABS Take 1 tablet by mouth daily.   Unknown    Review of Systems  Constitutional:  Negative for fever.  Respiratory:  Negative for shortness of breath.   Cardiovascular:  Negative for chest pain.  Gastrointestinal:  Positive for abdominal pain. Negative for nausea and vomiting.  Genitourinary:  Negative for dysuria, vaginal bleeding and vaginal discharge.  Musculoskeletal:  Positive for back pain.  Skin:  Negative for rash.  Neurological:  Negative for dizziness and light-headedness.   Physical Exam   Blood pressure 111/73, pulse 74, temperature 98.6 F (37 C), temperature source Oral, resp. rate 16, height 5\' 2"  (1.575 m), weight 94.4 kg, last menstrual period 09/09/2021, SpO2 98 %, unknown if currently breastfeeding.  Physical Exam Constitutional:      General: She is  not in acute distress.    Appearance: She is not ill-appearing.  Cardiovascular:     Rate and Rhythm: Normal rate and regular rhythm.     Heart sounds: No murmur heard.    No friction rub. No gallop.  Pulmonary:     Effort: Pulmonary effort is normal.     Breath sounds: Normal breath sounds. No wheezing, rhonchi or rales.  Abdominal:     Tenderness: There is no abdominal tenderness.  Musculoskeletal:     Right lower leg: No edema.     Left lower leg: No edema.  Skin:    General: Skin is warm and dry.  Neurological:     General: No focal deficit present.     Mental Status: She is alert and oriented to person, place, and time.     MAU Course  Procedures  [place] results pending  Assessment and Plan  Cheryl Bright is a 36 y.o. female [phone] at [redacted]w[redacted]d presents for 1 week of lower back pain and right sided abdominal cramping. Physical exam and vital signs  are reassuring. Pain is consistent with musculoskeletal pain, though the differential also includes UTI, STI, less likely ectopic pregnancy. Plan for ectopic rule-out workup to include CBC, [place], hCG as well as UA and swab for STI.   B[place] 10/25/2021, 1:13 PM    CNM attestation:  I have seen and examined this patient; I agree with above documentation in the medical student's note.   Cheryl Bright is a 36 y.o. [id number] reporting lower abdominal pain in early pregnancy. Denies VB, urinary symptoms, vaginal itching/burning.  PE: BP 133/88   Pulse 72   Temp 98.6 F (37 C) (Oral)   Resp 16   Ht 5\' 2"  (1.575 m)   Wt 94.4 kg   LMP 09/09/2021 (Exact Date)   SpO2 98% Comment: room air  BMI 38.06 kg/m  Gen: calm comfortable, NAD Resp: normal effort, no distress Abd: soft, nontender  ROS, labs, PMH reviewed     Results for orders placed or performed during the hospital encounter of 10/25/21 (from the past 24 hour(s))  Pregnancy, urine POC     Status: Abnormal   Collection Time: 10/25/21 10:47 AM  Result Value Ref Range   Preg Test, Ur POSITIVE (A) NEGATIVE  Urinalysis, Routine w reflex microscopic     Status: Abnormal   Collection Time: 10/25/21 10:51 AM  Result Value Ref Range   Color, Urine YELLOW YELLOW   APPearance HAZY (A) CLEAR   Specific Gravity, Urine 1.011 1.005 - 1.030   pH 6.0 5.0 - 8.0   Glucose, UA NEGATIVE NEGATIVE mg/dL   Hgb urine dipstick SMALL (A) NEGATIVE   Bilirubin Urine NEGATIVE NEGATIVE   Ketones, ur NEGATIVE NEGATIVE mg/dL   Protein, ur NEGATIVE NEGATIVE mg/dL   Nitrite POSITIVE (A) NEGATIVE   Leukocytes,Ua NEGATIVE NEGATIVE   RBC / HPF 0-5 0 - 5 RBC/hpf   WBC, UA 0-5 0 - 5 WBC/hpf   Bacteria, UA MANY (A) NONE SEEN   Squamous Epithelial / LPF 0-5 0 - 5   Mucus PRESENT   CBC     Status: None   Collection Time: 10/25/21 12:09 PM  Result Value Ref Range   WBC 6.6 4.0 - 10.5 K/uL   RBC 4.41 3.87 - 5.11 MIL/uL   Hemoglobin 13.6 12.0 - 15.0  g/dL   HCT 10/27/21 72.5 - 36.6 %   MCV 89.1 80.0 - 100.0 fL   MCH 30.8 26.0 -  34.0 pg   MCHC 34.6 30.0 - 36.0 g/dL   RDW 93.8 18.2 - 99.3 %   Platelets 270 150 - 400 K/uL   nRBC 0.0 0.0 - 0.2 %  hCG, quantitative, pregnancy     Status: Abnormal   Collection Time: 10/25/21 12:10 PM  Result Value Ref Range   hCG, Beta Chain, Quant, S 24,574 (H) <5 mIU/mL  Wet prep, genital     Status: None   Collection Time: 10/25/21 12:36 PM  Result Value Ref Range   Yeast Wet Prep HPF POC NONE SEEN NONE SEEN   Trich, Wet Prep NONE SEEN NONE SEEN   Clue Cells Wet Prep HPF POC NONE SEEN NONE SEEN   WBC, Wet Prep HPF POC <10 <10   Sperm NONE SEEN     Imaging: US OB LESS THAN 14 WEEKS WITH OB TRANSVAGINAL  Result Date: 10/25/2021 CLINICAL DATA:  Right lower quadrant and pelvic pain for 1 week. Gestational age by LMP of 6 weeks 4 days. EXAM: OBSTETRIC <14 WK [place] AND TRANSVAGINAL OB US TECHNIQUE: Both transabdominal and transvaginal ultrasound examinations were performed for complete evaluation of the gestation as well as the maternal uterus, adnexal regions, and pelvic cul-de-sac. Transvaginal technique was performed to assess early pregnancy. COMPARISON:  None Available. FINDINGS: Intrauterine gestational sac: Single Yolk sac:  Visualized. Embryo:  Visualized. Cardiac Activity: Visualized. Heart Rate: 84 bpm CRL:  4 mm   6 w   1 d                  [place] EDC: 06/19/2022 Subchorionic hemorrhage:  None visualized. Maternal uterus/adnexae: Both ovaries are normal in appearance. No mass or abnormal free fluid identified. IMPRESSION: Single living IUP with estimated gestational age of [redacted] weeks 1 day, and [place] EDC of 06/19/2022. No maternal uterine or adnexal abnormality identified. Electronically Signed   By: Danae Orleans M.D.   On: 10/25/2021 14:18     MDM:  IUP noted on today's [place].  Nitrites in urine likely indicate UTI, so will discharge pt with abx and send urine for culture.  FHR 86 on today's [place], which may be normal  due to early GA but will follow up with viability Korea in 10-14 days.  Pt to f/u with [facility] practice.  Return to MAU as needed for emergencies.  A/P: 1. UTI (urinary tract infection) during pregnancy, first trimester   2. Abdominal pain during pregnancy, first trimester   3. [redacted] weeks gestation of pregnancy      D/C home with return precautions  Allergies as of 10/25/2021       Reactions   Sulfa Drugs Cross Reactors Hives, Swelling   Latex Rash        Medication List     TAKE these medications    cefadroxil 500 MG capsule Commonly known as: DURICEF Take 1 capsule (500 mg total) by mouth 2 (two) times daily for 7 days.   PrePLUS 27-1 MG Tabs Take 1 tablet by mouth daily.         Sharen Counter, CNM 3:06 PM

## 2021-10-26 LAB — GC/CHLAMYDIA PROBE AMP (~~LOC~~) NOT AT ARMC
Chlamydia: NEGATIVE
Comment: NEGATIVE
Comment: NORMAL
Neisseria Gonorrhea: NEGATIVE

## 2021-10-27 LAB — CULTURE, OB URINE: Culture: >=100000 — AB

## 2021-11-09 ENCOUNTER — Ambulatory Visit
Admission: RE | Admit: 2021-11-09 | Discharge: 2021-11-09 | Disposition: A | Discharge status: Home / Self Care | Payer: Medicaid Other | Source: Ambulatory Visit | Attending: Advanced Practice Midwife | Admitting: Advanced Practice Midwife

## 2021-11-09 DIAGNOSIS — R109 Unspecified abdominal pain: Secondary | ICD-10-CM | POA: Diagnosis present

## 2021-11-09 DIAGNOSIS — O26891 Other specified pregnancy related conditions, first trimester: Secondary | ICD-10-CM

## 2021-11-09 DIAGNOSIS — Z3A01 Less than 8 weeks gestation of pregnancy: Secondary | ICD-10-CM

## 2021-11-22 ENCOUNTER — Other Ambulatory Visit: Payer: Self-pay

## 2021-11-22 ENCOUNTER — Encounter (HOSPITAL_COMMUNITY): Payer: Self-pay | Admitting: Obstetrics and Gynecology

## 2021-11-22 ENCOUNTER — Inpatient Hospital Stay (HOSPITAL_COMMUNITY)
Admission: AD | Admit: 2021-11-22 | Discharge: 2021-11-22 | Disposition: A | Discharge status: Home / Self Care | Payer: Medicaid Other | Attending: Obstetrics and Gynecology | Admitting: Obstetrics and Gynecology

## 2021-11-22 DIAGNOSIS — Z3A10 10 weeks gestation of pregnancy: Secondary | ICD-10-CM | POA: Insufficient documentation

## 2021-11-22 DIAGNOSIS — O0941 Supervision of pregnancy with grand multiparity, first trimester: Secondary | ICD-10-CM | POA: Diagnosis not present

## 2021-11-22 DIAGNOSIS — O09521 Supervision of elderly multigravida, first trimester: Secondary | ICD-10-CM | POA: Insufficient documentation

## 2021-11-22 DIAGNOSIS — R109 Unspecified abdominal pain: Secondary | ICD-10-CM | POA: Diagnosis not present

## 2021-11-22 DIAGNOSIS — O209 Hemorrhage in early pregnancy, unspecified: Secondary | ICD-10-CM | POA: Diagnosis not present

## 2021-11-22 DIAGNOSIS — O26891 Other specified pregnancy related conditions, first trimester: Secondary | ICD-10-CM | POA: Diagnosis present

## 2021-11-22 LAB — URINALYSIS, ROUTINE W REFLEX MICROSCOPIC
Bilirubin Urine: NEGATIVE
Glucose, UA: NEGATIVE mg/dL
Ketones, ur: NEGATIVE mg/dL
Leukocytes,Ua: NEGATIVE
Nitrite: NEGATIVE
Protein, ur: NEGATIVE mg/dL
Specific Gravity, Urine: 1.012 (ref 1.005–1.030)
pH: 7 (ref 5.0–8.0)

## 2021-11-22 NOTE — MAU Provider Note (Signed)
Chief Complaint: Abdominal Pain and Vaginal Bleeding   Event Date/Time   First Provider Initiated Contact with Patient 11/22/21 1346      SUBJECTIVE HPI: Cheryl Bright is a 36 y.o. [phone] at [redacted]w[redacted]d by LMP who presents to maternity admissions reporting vaginal bleeding x 12 days. The bleeding is light, enough to require a pantyliner but not a pad. There is mild cramping pain associated with the bleeding.  She had [place] on 11/09/21 with 8 week IUP. She denies vaginal itching/burning, urinary symptoms, h/a, dizziness, n/v, or fever/chills.      HPI  Past Medical History:  Diagnosis Date   Depression    MS (multiple sclerosis) ([place])    Pregnancy induced hypertension    Sickle cell trait ([place])    Past Surgical History:  Procedure Laterality Date   CESAREAN SECTION     DILATION AND EVACUATION N/A 03/06/2018   Procedure: DILATATION AND EVACUATION;  Surgeon: Osborne Oman, MD;  Location: [facility];  Service: Gynecology;  Laterality: N/A;   FOOT SURGERY     Social History   Socioeconomic History   Marital status: Single    Spouse name: Not on file   Number of children: Not on file   Years of education: Not on file   Highest education level: Not on file  Occupational History   Not on file  Tobacco Use   Smoking status: Every Day    Packs/day: 0.50    Years: 5.00    Total pack years: 2.50    Types: Cigarettes   Smokeless tobacco: Never  Vaping Use   Vaping Use: Never used  Substance and Sexual Activity   Alcohol use: Yes    Comment: Socially   Drug use: Not Currently    Types: Marijuana   Sexual activity: Not on file  Other Topics Concern   Not on file  Social History Narrative   Not on file   Social Determinants of Health   Financial Resource Strain: Not on file  Food Insecurity: No Food Insecurity (10/27/2018)   Hunger Vital Sign    Worried About Running Out of Food in the Last Year: Never true    Ran Out of Food in the Last Year: Never true  Transportation Needs: No  Transportation Needs (10/27/2018)   PRAPARE - Hydrologist (Medical): No    Lack of Transportation (Non-Medical): No  Physical Activity: Not on file  Stress: Not on file  Social Connections: Not on file  Intimate Partner Violence: Not on file   No current facility-administered medications on file prior to encounter.   Current Outpatient Medications on File Prior to Encounter  Medication Sig Dispense Refill   Prenatal Vit-Fe Fumarate-FA (PREPLUS) 27-1 MG TABS Take 1 tablet by mouth daily.     Allergies  Allergen Reactions   Sulfa Drugs Cross Reactors Hives and Swelling   Latex Rash    ROS:  Review of Systems  Constitutional:  Negative for chills, fatigue and fever.  Respiratory:  Negative for shortness of breath.   Cardiovascular:  Negative for chest pain.  Gastrointestinal:  Positive for abdominal pain.  Genitourinary:  Positive for vaginal bleeding. Negative for difficulty urinating, dysuria, flank pain, pelvic pain, vaginal discharge and vaginal pain.  Neurological:  Negative for dizziness and headaches.  Psychiatric/Behavioral: Negative.       I have reviewed patient's Past Medical Hx, Surgical Hx, Family Hx, Social Hx, medications and allergies.   Physical Exam  Patient Vitals  for the past 24 hrs:  BP Temp Temp src Pulse Resp SpO2 Height Weight  11/22/21 1454 130/65 -- -- 60 -- -- -- --  11/22/21 1207 118/69 98.3 F (36.8 C) Oral 67 16 98 % -- --  11/22/21 1203 -- -- -- -- -- -- 5\' 2"  (1.575 m) 95 kg   Constitutional: Well-developed, well-nourished female in no acute distress.  Cardiovascular: normal rate Respiratory: normal effort GI: Abd soft, non-tender. Pos BS x 4 MS: Extremities nontender, no edema, normal ROM Neurologic: Alert and oriented x 4.  GU: Neg CVAT.  PELVIC EXAM: Deferred  Bedside US by CNM with FHR 140s, subjectively normal amniotic fluid, no subchorionic hemorrhage visualized. Pt informed that the ultrasound is  considered a limited OB ultrasound and is not intended to be a complete ultrasound exam.  Patient also informed that the ultrasound is not being completed with the intent of assessing for fetal or placental anomalies or any pelvic abnormalities.  Explained that the purpose of today's ultrasound is to assess for  viability.  Patient acknowledges the purpose of the exam and the limitations of the study.     LAB RESULTS Results for orders placed or performed during the hospital encounter of 11/22/21 (from the past 24 hour(s))  Urinalysis, Routine w reflex microscopic Urine, Clean Catch     Status: Abnormal   Collection Time: 11/22/21 12:36 PM  Result Value Ref Range   Color, Urine YELLOW YELLOW   APPearance CLEAR CLEAR   Specific Gravity, Urine 1.012 1.005 - 1.030   pH 7.0 5.0 - 8.0   Glucose, UA NEGATIVE NEGATIVE mg/dL   Hgb urine dipstick SMALL (A) NEGATIVE   Bilirubin Urine NEGATIVE NEGATIVE   Ketones, ur NEGATIVE NEGATIVE mg/dL   Protein, ur NEGATIVE NEGATIVE mg/dL   Nitrite NEGATIVE NEGATIVE   Leukocytes,Ua NEGATIVE NEGATIVE   RBC / HPF 0-5 0 - 5 RBC/hpf   WBC, UA 0-5 0 - 5 WBC/hpf   Bacteria, UA RARE (A) NONE SEEN   Squamous Epithelial / LPF 6-10 0 - 5   Mucus PRESENT        IMAGING US OB Transvaginal  Result Date: 11/09/2021 CLINICAL DATA:  Abdominal pain, first trimester pregnancy EXAM: TRANSVAGINAL OB ULTRASOUND TECHNIQUE: Transvaginal ultrasound was performed for complete evaluation of the gestation as well as the maternal uterus, adnexal regions, and pelvic cul-de-sac. COMPARISON:  10/25/2021 FINDINGS: Intrauterine gestational sac: Single Yolk sac:  Seen Embryo:  Seen Cardiac Activity: Seen Heart Rate: 183 bpm CRL:   16.1 mm   8 w 0 d                  [place]orea EDC: 06/21/2022 Subchorionic hemorrhage:  None visualized. Maternal uterus/adnexae: No significant abnormalities are seen. There is 2.2 cm anechoic structure in the right ovary, possibly a follicle or functional cyst. IMPRESSION:  Single live intrauterine pregnancy is seen. Sonographically estimated gestational age is 62 weeks. There has been interval growth. There is no demonstrable subchorionic bleed. Cervix is closed. There is no free fluid or adnexal masses in pelvis. Electronically Signed   By: Elmer Picker M.D.   On: 11/09/2021 16:42   US OB LESS THAN 14 WEEKS WITH OB TRANSVAGINAL  Result Date: 10/25/2021 CLINICAL DATA:  Right lower quadrant and pelvic pain for 1 week. Gestational age by LMP of 6 weeks 4 days. EXAM: OBSTETRIC <14 WK [place] AND TRANSVAGINAL OB US TECHNIQUE: Both transabdominal and transvaginal ultrasound examinations were performed for complete evaluation of the gestation as  well as the maternal uterus, adnexal regions, and pelvic cul-de-sac. Transvaginal technique was performed to assess early pregnancy. COMPARISON:  None Available. FINDINGS: Intrauterine gestational sac: Single Yolk sac:  Visualized. Embryo:  Visualized. Cardiac Activity: Visualized. Heart Rate: 84 bpm CRL:  4 mm   6 w   1 d                  [place] EDC: 06/19/2022 Subchorionic hemorrhage:  None visualized. Maternal uterus/adnexae: Both ovaries are normal in appearance. No mass or abnormal free fluid identified. IMPRESSION: Single living IUP with estimated gestational age of [redacted] weeks 1 day, and [place] EDC of 06/19/2022. No maternal uterine or adnexal abnormality identified. Electronically Signed   By: Marlaine Hind M.D.   On: 10/25/2021 14:18    MAU Management/MDM: Orders Placed This Encounter  Procedures   US OB Transvaginal   Urinalysis, Routine w reflex microscopic Urine, Clean Catch   Discharge patient    No orders of the defined types were placed in this encounter.   Bedside US confirms viable IUP today. Pt bleeding light but consistent x 12 days.  Wet prep and GCC negative on 10/25/21, pt declines any symptoms or concerns about infection.  Planned SSE but pt ready to leave, reassured by normal Korea so SSE deferred.  Pt to f/u with prenatal  care at [facility] so outpatient Korea ordered there in 2 weeks due to bleeding.  Bleeding precautions/reasons to return to care reviewed.    ASSESSMENT 1. Vaginal bleeding in pregnancy, first trimester   2. [redacted] weeks gestation of pregnancy     PLAN Discharge home Allergies as of 11/22/2021       Reactions   Sulfa Drugs Cross Reactors Hives, Swelling   Latex Rash        Medication List     TAKE these medications    PrePLUS 27-1 MG Tabs Take 1 tablet by mouth daily.        Follow-up Information     [facility] Follow up.   Specialty: Obstetrics and Gynecology Why: The office will call you with appointment or call the number listed. Contact information: [place] [phone] [phone]        [facility] Follow up.   Specialty: Obstetrics and Gynecology Why: As needed for emergencies Contact information: [address] [phone] [place] Certified Nurse-Midwife 11/22/2021  3:08 PM

## 2021-11-22 NOTE — MAU Note (Signed)
Cheryl Bright is a 36 y.o. at [redacted]w[redacted]d here in MAU reporting: intermittent bleeding since sept 8. States it is not heavy but occurs daily and has seen some clots. Mild intermittent pain.   Onset of complaint: ongoing  Pain score: 5/10  Vitals:   11/22/21 1207  BP: 118/69  Pulse: 67  Resp: 16  Temp: 98.3 F (36.8 C)  SpO2: 98%     Lab orders placed from triage: ua

## 2021-11-23 ENCOUNTER — Encounter (HOSPITAL_BASED_OUTPATIENT_CLINIC_OR_DEPARTMENT_OTHER): Payer: Self-pay | Admitting: *Deleted

## 2021-11-30 ENCOUNTER — Ambulatory Visit
Admission: RE | Admit: 2021-11-30 | Discharge: 2021-11-30 | Disposition: A | Discharge status: Home / Self Care | Payer: Medicaid Other | Source: Ambulatory Visit | Attending: Advanced Practice Midwife | Admitting: Advanced Practice Midwife

## 2021-11-30 ENCOUNTER — Other Ambulatory Visit (HOSPITAL_COMMUNITY): Payer: Self-pay | Admitting: Advanced Practice Midwife

## 2021-11-30 DIAGNOSIS — O209 Hemorrhage in early pregnancy, unspecified: Secondary | ICD-10-CM

## 2021-12-05 ENCOUNTER — Encounter (HOSPITAL_BASED_OUTPATIENT_CLINIC_OR_DEPARTMENT_OTHER): Payer: Self-pay

## 2021-12-05 ENCOUNTER — Encounter (HOSPITAL_BASED_OUTPATIENT_CLINIC_OR_DEPARTMENT_OTHER): Payer: Self-pay | Admitting: Advanced Practice Midwife

## 2021-12-05 ENCOUNTER — Other Ambulatory Visit (HOSPITAL_COMMUNITY)
Admission: RE | Admit: 2021-12-05 | Discharge: 2021-12-05 | Disposition: A | Discharge status: Home / Self Care | Payer: Medicaid Other | Source: Ambulatory Visit | Attending: Advanced Practice Midwife | Admitting: Advanced Practice Midwife

## 2021-12-05 ENCOUNTER — Ambulatory Visit (INDEPENDENT_AMBULATORY_CARE_PROVIDER_SITE_OTHER): Payer: Medicaid Other | Admitting: Advanced Practice Midwife

## 2021-12-05 ENCOUNTER — Ambulatory Visit (INDEPENDENT_AMBULATORY_CARE_PROVIDER_SITE_OTHER): Payer: Medicaid Other | Admitting: *Deleted

## 2021-12-05 VITALS — BP 127/89 | HR 81 | Wt 212.6 lb

## 2021-12-05 DIAGNOSIS — G35 Multiple sclerosis: Secondary | ICD-10-CM

## 2021-12-05 DIAGNOSIS — O0991 Supervision of high risk pregnancy, unspecified, first trimester: Secondary | ICD-10-CM

## 2021-12-05 DIAGNOSIS — N760 Acute vaginitis: Secondary | ICD-10-CM | POA: Insufficient documentation

## 2021-12-05 DIAGNOSIS — Z98891 History of uterine scar from previous surgery: Secondary | ICD-10-CM

## 2021-12-05 DIAGNOSIS — Z3A12 12 weeks gestation of pregnancy: Secondary | ICD-10-CM

## 2021-12-05 DIAGNOSIS — B9689 Other specified bacterial agents as the cause of diseases classified elsewhere: Secondary | ICD-10-CM

## 2021-12-05 DIAGNOSIS — O0990 Supervision of high risk pregnancy, unspecified, unspecified trimester: Secondary | ICD-10-CM

## 2021-12-05 DIAGNOSIS — O26851 Spotting complicating pregnancy, first trimester: Secondary | ICD-10-CM

## 2021-12-05 DIAGNOSIS — O99350 Diseases of the nervous system complicating pregnancy, unspecified trimester: Secondary | ICD-10-CM

## 2021-12-05 DIAGNOSIS — Z3481 Encounter for supervision of other normal pregnancy, first trimester: Secondary | ICD-10-CM

## 2021-12-05 DIAGNOSIS — O219 Vomiting of pregnancy, unspecified: Secondary | ICD-10-CM

## 2021-12-05 MED ORDER — METOCLOPRAMIDE HCL 10 MG PO TABS: 10.0000 mg | ORAL_TABLET | Freq: Three times a day (TID) | ORAL | 5 refills | Status: DC | PRN

## 2021-12-05 MED ORDER — PROMETHAZINE HCL 12.5 MG PO TABS: 12.5000 mg | ORAL_TABLET | Freq: Every evening | ORAL | 2 refills | Status: DC | PRN

## 2021-12-05 MED ORDER — BLOOD PRESSURE KIT DEVI: 1.0000 | Freq: Once | 0 refills | Status: AC

## 2021-12-05 NOTE — Progress Notes (Unsigned)
   PRENATAL VISIT NOTE  Subjective:  Cheryl Bright is a 36 y.o. [phone] at [redacted]w[redacted]d being seen today for ongoing prenatal care.  She is currently monitored for the following issues for this high-risk pregnancy and has Supervision of high risk pregnancy, antepartum; History of pregnancy induced hypertension; VBAC, delivered; Postpartum care following VBAC delivery 3/26; Postpartum hypertension; History of VBAC; and Multiple sclerosis during pregnancy ([place]) on their problem list.  Patient reports bleeding and nausea.  Contractions: Irritability. Vag. Bleeding: Scant.  Movement: Absent. Denies leaking of fluid.   The following portions of the patient's history were reviewed and updated as appropriate: allergies, current medications, past family history, past medical history, past social history, past surgical history and problem list.   Objective:   Vitals:   12/05/21 1333  BP: 127/89  Pulse: 81  Weight: 212 lb 9.6 oz (96.4 kg)    Fetal Status: Fetal Heart Rate (bpm): 174   Movement: Absent     General:  Alert, oriented and cooperative. Patient is in no acute distress.  Skin: Skin is warm and dry. No rash noted.   Cardiovascular: Normal heart rate noted  Respiratory: Normal respiratory effort, no problems with respiration noted  Abdomen: Soft, gravid, appropriate for gestational age.  Pain/Pressure: Present     Pelvic: Cervical exam deferred        Extremities: Normal range of motion.  Edema: None  Mental Status: Normal mood and affect. Normal behavior. Normal judgment and thought content.   Assessment and Plan:  Pregnancy: [id number] at [redacted]w[redacted]d 1. [redacted] weeks gestation of pregnancy   2. Multiple sclerosis during pregnancy ([place]) --Not on medications at this time, has not had follow up recently  3. Supervision of high risk pregnancy in first trimester --Anticipatory guidance about next visits/weeks of pregnancy given.   - ABO/Rh - Antibody screen - CBC - Hepatitis B surface antigen -  HIV Antibody (routine testing w rflx) - HIV (Save tube for possible reflex) - RPR - Rubella screen - Hepatitis C antibody - [place] MFM OB DETAIL +14 WK; Future - Cervicovaginal ancillary only( [place]) - Urine Culture  4. History of VBAC --Vaginal delivery followed by cesarean section, then successful VBAC x 3  5. Encounter for supervision of other normal pregnancy in first trimester  - Panorama Prenatal Test Full Panel - [facility] Custom  6. Nausea and vomiting during pregnancy prior to [redacted] weeks gestation --Discussed diet changes, Rx for antiemetics  - metoCLOPramide (REGLAN) 10 MG tablet; Take 1 tablet (10 mg total) by mouth 3 (three) times daily with meals as needed for nausea.  Dispense: 30 tablet; Refill: 5 - promethazine (PHENERGAN) 12.5 MG tablet; Take 1-2 tablets (12.5-25 mg total) by mouth at bedtime as needed for nausea or vomiting.  Dispense: 30 tablet; Refill: 2  7. Spotting affecting pregnancy in first trimester --Intermittent, seen only when wiping, last bleeding 2 days ago   Preterm labor symptoms and general obstetric precautions including but not limited to vaginal bleeding, contractions, leaking of fluid and fetal movement were reviewed in detail with the patient. Please refer to After Visit Summary for other counseling recommendations.   Return in about 4 weeks (around 01/02/2022) for LOB, Any provider.  Future Appointments  Date Time Provider [place]  01/04/2022 11:15 AM Megan Salon, MD [facility]  01/19/2022  1:15 PM [facility] NURSE [facility]  01/19/2022  1:30 PM [facility] US3 [facility]    Fatima Blank, CNM

## 2021-12-05 NOTE — Progress Notes (Signed)
New OB Intake  I explained I am completing New OB Intake today. We discussed her EDD of 06/16/22 that is based on LMP of 09/09/21. Pt is G9/P5. I reviewed her allergies, medications, Medical/Surgical/OB history, and appropriate screenings. I informed her of [facility] services. [facility] information placed in AVS. Based on history, this is a/an  pregnancy complicated by [facility] and MS  .   Patient Active Problem List   Diagnosis Date Noted   History of VBAC 12/05/2021   Multiple sclerosis during pregnancy ([place]) 12/05/2021   Postpartum care following VBAC delivery 3/26 05/31/2019   Postpartum hypertension 05/31/2019   VBAC, delivered 05/30/2019   History of pregnancy induced hypertension 10/23/2018   Supervision of high risk pregnancy, antepartum 10/02/2014    Concerns addressed today  Delivery Plans Plans to deliver at [facility] Patient given information for [facility] Healthy Baby website for more information about Women's and [place].   MyChart/Babyscripts MyChart access verified. I explained pt will have some visits in office and some virtually. Babyscripts instructions given and order placed.   Blood Pressure Cuff/Weight Scale Blood pressure cuff ordered for patient to pick-up from [employer]. Explained after first prenatal appt pt will check weekly and document in 73.  Anatomy US Explained first scheduled [place] will be around 19 weeks. Anatomy US scheduled for 01/19/22 at 1330. Pt notified to arrive at 1315.  Labs Discussed Johnsie Cancel genetic screening with patient. Would like both Panorama and Horizon drawn at new OB visit. Routine prenatal labs needed.  Social Determinants of Health Food Insecurity: Patient denies food insecurity. WIC Referral: Patient is interested in referral to [facility].  Transportation: Patient denies transportation needs.    Blenda Nicely, RN 12/05/2021  3:14 PM

## 2021-12-06 LAB — CERVICOVAGINAL ANCILLARY ONLY
Bacterial Vaginitis (gardnerella): POSITIVE — AB
Candida Glabrata: NEGATIVE
Candida Vaginitis: NEGATIVE
Chlamydia: NEGATIVE
Comment: NEGATIVE
Comment: NEGATIVE
Comment: NEGATIVE
Comment: NEGATIVE
Comment: NORMAL
Neisseria Gonorrhea: NEGATIVE

## 2021-12-06 LAB — CBC
Hematocrit: 39.6 % (ref 34.0–46.6)
Hemoglobin: 13.2 g/dL (ref 11.1–15.9)
MCH: 31.4 pg (ref 26.6–33.0)
MCHC: 33.3 g/dL (ref 31.5–35.7)
MCV: 94 fL (ref 79–97)
Platelets: 285 10*3/uL (ref 150–450)
RBC: 4.21 x10E6/uL (ref 3.77–5.28)
RDW: 13.1 % (ref 11.7–15.4)
WBC: 8.2 10*3/uL (ref 3.4–10.8)

## 2021-12-06 LAB — HIV ANTIBODY (ROUTINE TESTING W REFLEX): HIV Screen 4th Generation wRfx: NONREACTIVE

## 2021-12-06 LAB — ABO/RH: Rh Factor: POSITIVE

## 2021-12-06 LAB — ANTIBODY SCREEN: Antibody Screen: NEGATIVE

## 2021-12-06 LAB — HEPATITIS C ANTIBODY: Hep C Virus Ab: NONREACTIVE

## 2021-12-06 LAB — HEPATITIS B SURFACE ANTIGEN: Hepatitis B Surface Ag: NEGATIVE

## 2021-12-06 LAB — RPR: RPR Ser Ql: NONREACTIVE

## 2021-12-06 LAB — RUBELLA SCREEN: Rubella Antibodies, IGG: 2.54 index (ref >0.99)

## 2021-12-07 ENCOUNTER — Other Ambulatory Visit (HOSPITAL_BASED_OUTPATIENT_CLINIC_OR_DEPARTMENT_OTHER): Payer: Self-pay

## 2021-12-07 DIAGNOSIS — O0990 Supervision of high risk pregnancy, unspecified, unspecified trimester: Secondary | ICD-10-CM | POA: Insufficient documentation

## 2021-12-07 DIAGNOSIS — O219 Vomiting of pregnancy, unspecified: Secondary | ICD-10-CM

## 2021-12-07 LAB — URINE CULTURE

## 2021-12-07 MED ORDER — METRONIDAZOLE 500 MG PO TABS: 500.0000 mg | ORAL_TABLET | Freq: Two times a day (BID) | ORAL | 0 refills | Status: AC

## 2021-12-07 MED ORDER — METOCLOPRAMIDE HCL 10 MG PO TABS: 10.0000 mg | ORAL_TABLET | Freq: Three times a day (TID) | ORAL | 5 refills | Status: DC | PRN

## 2021-12-07 MED ORDER — PROMETHAZINE HCL 12.5 MG PO TABS: 12.5000 mg | ORAL_TABLET | Freq: Every evening | ORAL | 2 refills | Status: DC | PRN

## 2021-12-11 LAB — PANORAMA PRENATAL TEST FULL PANEL:PANORAMA TEST PLUS 5 ADDITIONAL MICRODELETIONS: FETAL FRACTION: 7

## 2021-12-12 ENCOUNTER — Institutional Professional Consult (permissible substitution): Payer: Medicaid Other | Admitting: Licensed Clinical Social Worker

## 2021-12-19 LAB — HORIZON CUSTOM: REPORT SUMMARY: POSITIVE — AB

## 2022-01-04 ENCOUNTER — Encounter (HOSPITAL_BASED_OUTPATIENT_CLINIC_OR_DEPARTMENT_OTHER): Payer: Self-pay | Admitting: Obstetrics & Gynecology

## 2022-01-04 ENCOUNTER — Ambulatory Visit (INDEPENDENT_AMBULATORY_CARE_PROVIDER_SITE_OTHER): Payer: Medicaid Other | Admitting: Obstetrics & Gynecology

## 2022-01-04 VITALS — BP 123/85 | HR 84 | Wt 210.8 lb

## 2022-01-04 DIAGNOSIS — O99350 Diseases of the nervous system complicating pregnancy, unspecified trimester: Secondary | ICD-10-CM

## 2022-01-04 DIAGNOSIS — Z641 Problems related to multiparity: Secondary | ICD-10-CM | POA: Insufficient documentation

## 2022-01-04 DIAGNOSIS — Z8759 Personal history of other complications of pregnancy, childbirth and the puerperium: Secondary | ICD-10-CM

## 2022-01-04 DIAGNOSIS — O09522 Supervision of elderly multigravida, second trimester: Secondary | ICD-10-CM | POA: Insufficient documentation

## 2022-01-04 DIAGNOSIS — Z98891 History of uterine scar from previous surgery: Secondary | ICD-10-CM

## 2022-01-04 DIAGNOSIS — O0992 Supervision of high risk pregnancy, unspecified, second trimester: Secondary | ICD-10-CM

## 2022-01-04 DIAGNOSIS — O0990 Supervision of high risk pregnancy, unspecified, unspecified trimester: Secondary | ICD-10-CM

## 2022-01-04 DIAGNOSIS — D573 Sickle-cell trait: Secondary | ICD-10-CM

## 2022-01-04 DIAGNOSIS — G35 Multiple sclerosis: Secondary | ICD-10-CM

## 2022-01-04 MED ORDER — ASPIRIN 81 MG PO TBEC: 81.0000 mg | DELAYED_RELEASE_TABLET | Freq: Every day | ORAL | 12 refills | Status: DC

## 2022-01-04 NOTE — Progress Notes (Signed)
   PRENATAL VISIT NOTE  Subjective:  Cheryl Bright is a 36 y.o. [id number] at [redacted]w[redacted]d being seen today for ongoing prenatal care.  She is currently monitored for the following issues for this high-risk pregnancy and has History of pregnancy induced hypertension; Postpartum hypertension; History of VBAC; Multiple sclerosis during pregnancy ([place]); Supervision of high risk pregnancy, antepartum; Grand multiparity; AMA (advanced maternal age) multigravida 14+, second trimester; and Sickle cell trait ([place]) on their problem list.  Patient reports no complaints.  Contractions: Not present. Vag. Bleeding: None.  Movement: Present. Denies leaking of fluid.   The following portions of the patient's history were reviewed and updated as appropriate: allergies, current medications, past family history, past medical history, past social history, past surgical history and problem list.   Objective:   Vitals:   01/04/22 1124  BP: 123/85  Pulse: 84  Weight: 210 lb 12.8 oz (95.6 kg)    Fetal Status: Fetal Heart Rate (bpm): 147   Movement: Present     General:  Alert, oriented and cooperative. Patient is in no acute distress.  Skin: Skin is warm and dry. No rash noted.   Cardiovascular: Normal heart rate noted  Respiratory: Normal respiratory effort, no problems with respiration noted  Abdomen: Soft, gravid, appropriate for gestational age.  Pain/Pressure: Present     Pelvic: Cervical exam deferred        Extremities: Normal range of motion.  Edema: None  Mental Status: Normal mood and affect. Normal behavior. Normal judgment and thought content.   Assessment and Plan:  Pregnancy: [id number] at [redacted]w[redacted]d 1. Supervision of high risk pregnancy in second trimester - Recommended taking PNV - baby ASA recommended - anatomy scan scheduled - follow up 4 weeks - AFP, Serum, Open Spina Bifida  2. Multigravida of advanced maternal age in second trimester  3. History of VBAC (3 successful VBACs)  4. Supervision of  high risk pregnancy, antepartum  5. Multiple sclerosis during pregnancy [facility]) - has seen Dr. Cher Nakai at [place].  Last appt was 01/2020.  Declines being seen for follow up at this time  6. Sickle cell trait ([place]) - FOB declines testing  7. History of pregnancy induced hypertension  8. [place] multiparity   Preterm labor symptoms and general obstetric precautions including but not limited to vaginal bleeding, contractions, leaking of fluid and fetal movement were reviewed in detail with the patient. Please refer to After Visit Summary for other counseling recommendations.   Return in about 4 weeks (around 02/01/2022).  Future Appointments  Date Time Provider [address]  01/19/2022  1:15 PM [facility] NURSE [facility]  01/19/2022  1:30 PM [facility] US3 [facility]  01/30/2022  9:55 AM Leftwich-Kirby, Kathie Dike, CNM [facility]  02/28/2022 10:45 AM Megan Salon, MD [facility]  03/21/2022  9:45 AM Megan Salon, MD [facility]  04/09/2022  9:35 AM Megan Salon, MD [facility]  04/23/2022  1:35 PM Megan Salon, MD [facility]  05/07/2022  1:35 PM Megan Salon, MD [facility]  05/21/2022  1:35 PM Megan Salon, MD [facility]  05/28/2022 10:15 AM Megan Salon, MD [facility]  06/04/2022  1:35 PM Megan Salon, MD [facility]  06/11/2022 10:35 AM Megan Salon, MD [facility]  06/18/2022  1:35 PM Megan Salon, MD [facility]    Megan Salon, MD

## 2022-01-05 ENCOUNTER — Encounter (HOSPITAL_BASED_OUTPATIENT_CLINIC_OR_DEPARTMENT_OTHER): Payer: Self-pay | Admitting: Obstetrics & Gynecology

## 2022-01-06 LAB — AFP, SERUM, OPEN SPINA BIFIDA
AFP MoM: 1.24
AFP Value: 30 ng/mL
Gest. Age on Collection Date: 16 weeks
Maternal Age At EDD: 36.1 yr
OSBR Risk 1 IN: 3432
Test Results:: NEGATIVE
Weight: 210 [lb_av]

## 2022-01-19 ENCOUNTER — Other Ambulatory Visit: Payer: Self-pay | Admitting: *Deleted

## 2022-01-19 ENCOUNTER — Ambulatory Visit: Payer: Medicaid Other | Admitting: *Deleted

## 2022-01-19 ENCOUNTER — Encounter: Payer: Self-pay | Admitting: *Deleted

## 2022-01-19 ENCOUNTER — Ambulatory Visit: Payer: Medicaid Other | Attending: Advanced Practice Midwife

## 2022-01-19 VITALS — BP 107/57 | HR 87

## 2022-01-19 DIAGNOSIS — O0991 Supervision of high risk pregnancy, unspecified, first trimester: Secondary | ICD-10-CM

## 2022-01-19 DIAGNOSIS — O0990 Supervision of high risk pregnancy, unspecified, unspecified trimester: Secondary | ICD-10-CM | POA: Insufficient documentation

## 2022-01-19 DIAGNOSIS — O34219 Maternal care for unspecified type scar from previous cesarean delivery: Secondary | ICD-10-CM

## 2022-01-19 DIAGNOSIS — O09522 Supervision of elderly multigravida, second trimester: Secondary | ICD-10-CM

## 2022-01-19 DIAGNOSIS — O99352 Diseases of the nervous system complicating pregnancy, second trimester: Secondary | ICD-10-CM

## 2022-01-19 DIAGNOSIS — Z362 Encounter for other antenatal screening follow-up: Secondary | ICD-10-CM

## 2022-01-19 DIAGNOSIS — O99212 Obesity complicating pregnancy, second trimester: Secondary | ICD-10-CM

## 2022-01-30 ENCOUNTER — Ambulatory Visit (INDEPENDENT_AMBULATORY_CARE_PROVIDER_SITE_OTHER): Payer: Medicaid Other | Admitting: Advanced Practice Midwife

## 2022-01-30 VITALS — BP 124/80 | HR 76 | Wt 218.0 lb

## 2022-01-30 DIAGNOSIS — Z3A20 20 weeks gestation of pregnancy: Secondary | ICD-10-CM

## 2022-01-30 DIAGNOSIS — G35 Multiple sclerosis: Secondary | ICD-10-CM

## 2022-01-30 DIAGNOSIS — O0990 Supervision of high risk pregnancy, unspecified, unspecified trimester: Secondary | ICD-10-CM

## 2022-01-30 DIAGNOSIS — Z98891 History of uterine scar from previous surgery: Secondary | ICD-10-CM

## 2022-01-30 DIAGNOSIS — O99350 Diseases of the nervous system complicating pregnancy, unspecified trimester: Secondary | ICD-10-CM

## 2022-01-30 NOTE — Patient Instructions (Signed)

## 2022-01-30 NOTE — Progress Notes (Signed)
   PRENATAL VISIT NOTE  Subjective:  Cheryl Bright is a 36 y.o. [phone] at [redacted]w[redacted]d being seen today for ongoing prenatal care.  She is currently monitored for the following issues for this high-risk pregnancy and has History of pregnancy induced hypertension; Postpartum hypertension; History of VBAC; Multiple sclerosis during pregnancy (HCC); Supervision of high risk pregnancy, antepartum; Grand multiparity; AMA (advanced maternal age) multigravida 35+, second trimester; and Sickle cell trait (HCC) on their problem list.  Patient reports no complaints.  Contractions: Irritability. Vag. Bleeding: None.  Movement: Present. Denies leaking of fluid.   The following portions of the patient's history were reviewed and updated as appropriate: allergies, current medications, past family history, past medical history, past social history, past surgical history and problem list.   Objective:   Vitals:   01/30/22 1007  BP: 124/80  Pulse: 76  Weight: 218 lb (98.9 kg)    Fetal Status: Fetal Heart Rate (bpm): 158   Movement: Present     General:  Alert, oriented and cooperative. Patient is in no acute distress.  Skin: Skin is warm and dry. No rash noted.   Cardiovascular: Normal heart rate noted  Respiratory: Normal respiratory effort, no problems with respiration noted  Abdomen: Soft, gravid, appropriate for gestational age.  Pain/Pressure: Absent     Pelvic: Cervical exam deferred        Extremities: Normal range of motion.  Edema: None  Mental Status: Normal mood and affect. Normal behavior. Normal judgment and thought content.   Assessment and Plan:  Pregnancy: [id number] at [redacted]w[redacted]d 1. Supervision of high risk pregnancy, antepartum --Anticipatory guidance about next visits/weeks of pregnancy given.   --Reviewed [place] on 01/19/22 --EFW 5%,Growth [place] in 5 weeks  2. History of VBAC --VBAC x 3 after initial C/S. Desires VBAC, consent at next visit with Dr Hyacinth Meeker.  3. Multiple sclerosis during  pregnancy (HCC) --Stable without medications, pt followed up with Dr Zena Amos ([facility]) on 11/21  4. [redacted] weeks gestation of pregnancy   Preterm labor symptoms and general obstetric precautions including but not limited to vaginal bleeding, contractions, leaking of fluid and fetal movement were reviewed in detail with the patient. Please refer to After Visit Summary for other counseling recommendations.   No follow-ups on file.  Future Appointments  Date Time Provider Department Center  02/16/2022 12:30 PM [facility] NURSE [facility]  02/16/2022 12:45 PM [facility] US5 [facility]  03/02/2022  9:45 AM Jerene Bears, MD [facility]  03/21/2022  9:45 AM Jerene Bears, MD [facility]  04/09/2022  9:35 AM Jerene Bears, MD [facility]  04/23/2022  1:35 PM Jerene Bears, MD [facility]  05/07/2022  1:35 PM Jerene Bears, MD [facility]  05/21/2022  1:35 PM Jerene Bears, MD [facility]  05/28/2022 10:15 AM Jerene Bears, MD [facility]  06/04/2022  1:35 PM Jerene Bears, MD [facility]  06/11/2022 10:35 AM Jerene Bears, MD [facility]  06/18/2022  1:35 PM Jerene Bears, MD [facility]    Sharen Counter, CNM

## 2022-02-16 ENCOUNTER — Other Ambulatory Visit: Payer: Self-pay | Admitting: Obstetrics and Gynecology

## 2022-02-16 ENCOUNTER — Ambulatory Visit (HOSPITAL_BASED_OUTPATIENT_CLINIC_OR_DEPARTMENT_OTHER): Payer: Medicaid Other | Admitting: Maternal & Fetal Medicine

## 2022-02-16 ENCOUNTER — Other Ambulatory Visit: Payer: Self-pay | Admitting: *Deleted

## 2022-02-16 ENCOUNTER — Ambulatory Visit: Payer: Medicaid Other | Admitting: *Deleted

## 2022-02-16 ENCOUNTER — Ambulatory Visit: Payer: Medicaid Other | Attending: Obstetrics and Gynecology

## 2022-02-16 VITALS — BP 119/73 | HR 76

## 2022-02-16 DIAGNOSIS — G35 Multiple sclerosis: Secondary | ICD-10-CM | POA: Diagnosis present

## 2022-02-16 DIAGNOSIS — Z362 Encounter for other antenatal screening follow-up: Secondary | ICD-10-CM | POA: Diagnosis present

## 2022-02-16 DIAGNOSIS — O09522 Supervision of elderly multigravida, second trimester: Secondary | ICD-10-CM

## 2022-02-16 DIAGNOSIS — Z3A22 22 weeks gestation of pregnancy: Secondary | ICD-10-CM | POA: Insufficient documentation

## 2022-02-16 DIAGNOSIS — E669 Obesity, unspecified: Secondary | ICD-10-CM | POA: Diagnosis not present

## 2022-02-16 DIAGNOSIS — O365990 Maternal care for other known or suspected poor fetal growth, unspecified trimester, not applicable or unspecified: Secondary | ICD-10-CM | POA: Insufficient documentation

## 2022-02-16 DIAGNOSIS — Z720 Tobacco use: Secondary | ICD-10-CM

## 2022-02-16 DIAGNOSIS — D573 Sickle-cell trait: Secondary | ICD-10-CM

## 2022-02-16 DIAGNOSIS — O365992 Maternal care for other known or suspected poor fetal growth, unspecified trimester, fetus 2: Secondary | ICD-10-CM | POA: Diagnosis not present

## 2022-02-16 DIAGNOSIS — O99212 Obesity complicating pregnancy, second trimester: Secondary | ICD-10-CM

## 2022-02-16 DIAGNOSIS — O99352 Diseases of the nervous system complicating pregnancy, second trimester: Secondary | ICD-10-CM | POA: Diagnosis present

## 2022-02-16 DIAGNOSIS — O34219 Maternal care for unspecified type scar from previous cesarean delivery: Secondary | ICD-10-CM

## 2022-02-16 DIAGNOSIS — F17210 Nicotine dependence, cigarettes, uncomplicated: Secondary | ICD-10-CM

## 2022-02-16 DIAGNOSIS — O365120 Maternal care for known or suspected placental insufficiency, second trimester, not applicable or unspecified: Secondary | ICD-10-CM

## 2022-02-16 DIAGNOSIS — O99332 Smoking (tobacco) complicating pregnancy, second trimester: Secondary | ICD-10-CM

## 2022-02-16 DIAGNOSIS — O0992 Supervision of high risk pregnancy, unspecified, second trimester: Secondary | ICD-10-CM

## 2022-02-16 DIAGNOSIS — O0990 Supervision of high risk pregnancy, unspecified, unspecified trimester: Secondary | ICD-10-CM

## 2022-02-16 DIAGNOSIS — O99012 Anemia complicating pregnancy, second trimester: Secondary | ICD-10-CM

## 2022-02-16 DIAGNOSIS — O365921 Maternal care for other known or suspected poor fetal growth, second trimester, fetus 1: Secondary | ICD-10-CM

## 2022-02-16 DIAGNOSIS — O09293 Supervision of pregnancy with other poor reproductive or obstetric history, third trimester: Secondary | ICD-10-CM

## 2022-02-16 NOTE — Progress Notes (Signed)
MFM Consult Note Patient Name: Cheryl Bright  Patient MRN:   [id number]  Referring provider: [facility]  Reason for Consult: FGR   HPI: Cheryl Bright is a 36 y.o. [id number] at [redacted]w[redacted]d here for ultrasound and consultation.   Counseling  I discussed the finding of fetal growth restriction (FGR) with the patient today. The ultrasound shows an overall growth at the 9th percentile and the abdominal circumference at the 6th percentile. The umbilical artery Dopplers are normal.  I counseled her about the clinical significance of the Doppler findings and antenatal testing. I discussed the various causes of growth restriction including constitutionally small fetus, placental insufficiency, genetic problems and chronic maternal disease. Currently there is no evidence of sonographic stigmata suggesting infection. The only feature suggesting aneuploidy is an absent nasal bone, however, she had normal aneuploidy screening.  I discussed the most likely cause of her fetal growth restriction is either a constitutionally small fetus or placental insufficiency. We discussed it is often challenging to differentiate between a fetus that is constitutionally small but is fulfilling its growth potential and a fetus that is not fulfilling its growth potential because of an underlying pathologic condition. Approximately 70% of fetuses with birth weight below the 10th percentile for gestational age are constitutionally small; in the remaining 30%, the cause of the small size is pathologic, meeting intrauterine growth restriction definition. I also discussed the importance of antenatal fetal surveillance including antenatal testing and umbilical artery Doppler assessment to reduce the risk of stillbirth.  I discussed the management going forward in the pregnancy with potential alteration in the timing of delivery. Currently she feels well and denies headache, vision changes, right upper quadrant pain, contractions, vaginal bleeding or loss of  fluid.  She reports good fetal movement.  Review of Systems: A review of systems was performed and was negative except per HPI   Vitals and Physical Exam See intake sheet for vitals Sitting comfortably on the sonogram table Nonlabored breathing Normal rate and rhythm Abdomen is nontender  Genetic testing: low risk NIPS  Sonographic findings Single intrauterine pregnancy at 22w 6d.  Observed fetal cardiac activity. Cephalic presentation. Interval fetal anatomy appears normal.  Fetal biometry shows the estimated fetal weight at the 9 percentile.  Amniotic fluid volume: Within normal limits. MVP: 5. cm. Placenta is posterior. Umbilical artery dopplers findings: -S/D:4 which are normal at this gestational age.  -Absent end-diastolic flow: No.  -Reversed end-diastolic flow:  No.  Assessment/Plan FGR - F/u UA doppler in 2 weeks and growth Korea in 3 weeks. If FGR is still present we can schedule her out with serial UA dopplers, growth [place] and NST (starting around 28 weeks).   The patient had time to ask questions which were answered to her satisfaction.  She verbalized understanding and request to proceed with the plan. I spent 30 minutes total in patient care including chart review and counseling with the patient.  Braxton Feathers [facility] MFM  02/16/2022  1:40 PM

## 2022-02-18 ENCOUNTER — Encounter (HOSPITAL_BASED_OUTPATIENT_CLINIC_OR_DEPARTMENT_OTHER): Payer: Self-pay | Admitting: Advanced Practice Midwife

## 2022-02-18 DIAGNOSIS — O365990 Maternal care for other known or suspected poor fetal growth, unspecified trimester, not applicable or unspecified: Secondary | ICD-10-CM | POA: Insufficient documentation

## 2022-02-28 ENCOUNTER — Encounter (HOSPITAL_BASED_OUTPATIENT_CLINIC_OR_DEPARTMENT_OTHER): Payer: Medicaid Other | Admitting: Obstetrics & Gynecology

## 2022-03-01 ENCOUNTER — Ambulatory Visit: Payer: Medicaid Other | Attending: Maternal & Fetal Medicine

## 2022-03-01 ENCOUNTER — Ambulatory Visit: Payer: Medicaid Other | Admitting: *Deleted

## 2022-03-01 VITALS — BP 131/82 | HR 76

## 2022-03-01 DIAGNOSIS — O99212 Obesity complicating pregnancy, second trimester: Secondary | ICD-10-CM | POA: Insufficient documentation

## 2022-03-01 DIAGNOSIS — O365921 Maternal care for other known or suspected poor fetal growth, second trimester, fetus 1: Secondary | ICD-10-CM | POA: Insufficient documentation

## 2022-03-01 DIAGNOSIS — O0990 Supervision of high risk pregnancy, unspecified, unspecified trimester: Secondary | ICD-10-CM | POA: Insufficient documentation

## 2022-03-01 DIAGNOSIS — O365920 Maternal care for other known or suspected poor fetal growth, second trimester, not applicable or unspecified: Secondary | ICD-10-CM | POA: Diagnosis not present

## 2022-03-01 DIAGNOSIS — O09292 Supervision of pregnancy with other poor reproductive or obstetric history, second trimester: Secondary | ICD-10-CM

## 2022-03-01 DIAGNOSIS — Z3A24 24 weeks gestation of pregnancy: Secondary | ICD-10-CM

## 2022-03-01 DIAGNOSIS — E669 Obesity, unspecified: Secondary | ICD-10-CM

## 2022-03-01 DIAGNOSIS — O34219 Maternal care for unspecified type scar from previous cesarean delivery: Secondary | ICD-10-CM

## 2022-03-01 DIAGNOSIS — O99891 Other specified diseases and conditions complicating pregnancy: Secondary | ICD-10-CM

## 2022-03-01 DIAGNOSIS — O99332 Smoking (tobacco) complicating pregnancy, second trimester: Secondary | ICD-10-CM

## 2022-03-01 DIAGNOSIS — Z862 Personal history of diseases of the blood and blood-forming organs and certain disorders involving the immune mechanism: Secondary | ICD-10-CM

## 2022-03-02 ENCOUNTER — Ambulatory Visit (INDEPENDENT_AMBULATORY_CARE_PROVIDER_SITE_OTHER): Payer: Medicaid Other | Admitting: Obstetrics & Gynecology

## 2022-03-02 ENCOUNTER — Encounter (HOSPITAL_BASED_OUTPATIENT_CLINIC_OR_DEPARTMENT_OTHER): Payer: Self-pay | Admitting: Obstetrics & Gynecology

## 2022-03-02 VITALS — BP 94/81 | HR 78 | Wt 220.0 lb

## 2022-03-02 DIAGNOSIS — O99350 Diseases of the nervous system complicating pregnancy, unspecified trimester: Secondary | ICD-10-CM

## 2022-03-02 DIAGNOSIS — D573 Sickle-cell trait: Secondary | ICD-10-CM

## 2022-03-02 DIAGNOSIS — O365990 Maternal care for other known or suspected poor fetal growth, unspecified trimester, not applicable or unspecified: Secondary | ICD-10-CM

## 2022-03-02 DIAGNOSIS — O09522 Supervision of elderly multigravida, second trimester: Secondary | ICD-10-CM

## 2022-03-02 DIAGNOSIS — Z641 Problems related to multiparity: Secondary | ICD-10-CM

## 2022-03-02 DIAGNOSIS — O0990 Supervision of high risk pregnancy, unspecified, unspecified trimester: Secondary | ICD-10-CM

## 2022-03-02 DIAGNOSIS — Z98891 History of uterine scar from previous surgery: Secondary | ICD-10-CM

## 2022-03-02 DIAGNOSIS — Z3A24 24 weeks gestation of pregnancy: Secondary | ICD-10-CM

## 2022-03-02 DIAGNOSIS — G35 Multiple sclerosis: Secondary | ICD-10-CM

## 2022-03-04 NOTE — Progress Notes (Signed)
   PRENATAL VISIT NOTE  Subjective:  Cheryl Bright is a 36 y.o. [phone] at [redacted]w[redacted]d being seen today for ongoing prenatal care.  She is currently monitored for the following issues for this high-risk pregnancy and has History of pregnancy induced hypertension; Postpartum hypertension; History of VBAC; Multiple sclerosis during pregnancy (HCC); Supervision of high risk pregnancy, antepartum; Grand multiparity; AMA (advanced maternal age) multigravida 35+, second trimester; Sickle cell trait (HCC); and Fetal growth restriction antepartum on their problem list.  Patient reports no complaints.  Contractions: Not present. Vag. Bleeding: None.  Movement: Present. Denies leaking of fluid.   The following portions of the patient's history were reviewed and updated as appropriate: allergies, current medications, past family history, past medical history, past social history, past surgical history and problem list.   Objective:   Vitals:   03/02/22 0951  BP: 94/81  Pulse: 78  Weight: 220 lb (99.8 kg)    Fetal Status: Fetal Heart Rate (bpm): 150 Fundal Height: 24 cm Movement: Present     General:  Alert, oriented and cooperative. Patient is in no acute distress.  Skin: Skin is warm and dry. No rash noted.   Cardiovascular: Normal heart rate noted  Respiratory: Normal respiratory effort, no problems with respiration noted  Abdomen: Soft, gravid, appropriate for gestational age.  Pain/Pressure: Present     Pelvic: Cervical exam deferred        Extremities: Normal range of motion.  Edema: None  Mental Status: Normal mood and affect. Normal behavior. Normal judgment and thought content.   Assessment and Plan:  Pregnancy: [id number] at [redacted]w[redacted]d 1. Supervision of high risk pregnancy, antepartum - on PNV and baby ASA - follow up 4 weeks for GTT and lab work/Tdap  2. [redacted] weeks gestation of pregnancy  3. Multiple sclerosis during pregnancy [facility]) - has not seen neurologist since 2021, Dr. Clayburn Pert at  [facility]  4. AMA (advanced maternal age) multigravida 35+, second trimester  5. History of VBAC - pt planning on VBAC with this pregnancy as well  6. [place] multiparity - BTL papers signed today  7. Sickle cell trait (HCC)  8. Fetal growth restriction antepartum - having q 3 week growth scans and weekly monitoring  Preterm labor symptoms and general obstetric precautions including but not limited to vaginal bleeding, contractions, leaking of fluid and fetal movement were reviewed in detail with the patient. Please refer to After Visit Summary for other counseling recommendations.   Return in about 4 weeks (around 03/30/2022).  Future Appointments  Date Time Provider Department Center  03/09/2022  8:45 AM [facility] NURSE [facility]  03/09/2022  9:00 AM [facility] US6 [facility]  03/16/2022  8:30 AM [facility]MFC NURSE [facility]  03/16/2022  8:45 AM [facility]MFC US6 [facility]  03/21/2022  9:45 AM Jerene Bears, MD [facility]  04/09/2022  9:35 AM Jerene Bears, MD [facility]  04/23/2022  1:35 PM Jerene Bears, MD [facility]  05/07/2022  1:35 PM Jerene Bears, MD [facility]  05/21/2022  1:35 PM Jerene Bears, MD [facility]  05/28/2022 10:15 AM Jerene Bears, MD [facility]  06/04/2022  1:35 PM Jerene Bears, MD [facility]  06/11/2022 10:35 AM Jerene Bears, MD [facility]  06/18/2022  1:35 PM Jerene Bears, MD [facility]    Jerene Bears, MD

## 2022-03-05 NOTE — L&D Delivery Note (Addendum)
Delivery Note 37 y.o. [id number] at [redacted]w[redacted]d admitted for spontaneous onset of labor.   At 10:47 AM a viable female was delivered via Vaginal, Spontaneous (Presentation:   Occiput Anterior), (VBAC).  APGAR: 8, 9; weight TBD   Placenta status: Spontaneous;Expressed, Intact.  Cord: 3 vessels with the following complications: None.  Cord pH: not collected  Anesthesia: None Episiotomy: None Lacerations: None Suture Repair:  None Est. Blood Loss (mL): 132  Mom to postpartum.  Baby to Couplet care / Skin to Skin.  Sheppard Evens MD MPH OB Fellow, Faculty Practice [facility], Center for [facility] 06/20/2022

## 2022-03-09 ENCOUNTER — Ambulatory Visit: Payer: Medicaid Other | Admitting: *Deleted

## 2022-03-09 ENCOUNTER — Ambulatory Visit: Payer: Medicaid Other | Attending: Maternal & Fetal Medicine

## 2022-03-09 VITALS — BP 119/75 | HR 63

## 2022-03-09 DIAGNOSIS — O99332 Smoking (tobacco) complicating pregnancy, second trimester: Secondary | ICD-10-CM

## 2022-03-09 DIAGNOSIS — Z3A25 25 weeks gestation of pregnancy: Secondary | ICD-10-CM

## 2022-03-09 DIAGNOSIS — O99212 Obesity complicating pregnancy, second trimester: Secondary | ICD-10-CM | POA: Diagnosis not present

## 2022-03-09 DIAGNOSIS — O09292 Supervision of pregnancy with other poor reproductive or obstetric history, second trimester: Secondary | ICD-10-CM

## 2022-03-09 DIAGNOSIS — O365920 Maternal care for other known or suspected poor fetal growth, second trimester, not applicable or unspecified: Secondary | ICD-10-CM | POA: Diagnosis not present

## 2022-03-09 DIAGNOSIS — E669 Obesity, unspecified: Secondary | ICD-10-CM | POA: Diagnosis not present

## 2022-03-09 DIAGNOSIS — O0990 Supervision of high risk pregnancy, unspecified, unspecified trimester: Secondary | ICD-10-CM | POA: Diagnosis present

## 2022-03-09 DIAGNOSIS — D573 Sickle-cell trait: Secondary | ICD-10-CM

## 2022-03-09 DIAGNOSIS — O34219 Maternal care for unspecified type scar from previous cesarean delivery: Secondary | ICD-10-CM

## 2022-03-09 DIAGNOSIS — O99012 Anemia complicating pregnancy, second trimester: Secondary | ICD-10-CM

## 2022-03-09 DIAGNOSIS — O365921 Maternal care for other known or suspected poor fetal growth, second trimester, fetus 1: Secondary | ICD-10-CM | POA: Diagnosis not present

## 2022-03-09 DIAGNOSIS — O09522 Supervision of elderly multigravida, second trimester: Secondary | ICD-10-CM | POA: Diagnosis not present

## 2022-03-15 ENCOUNTER — Other Ambulatory Visit: Payer: Self-pay | Admitting: *Deleted

## 2022-03-15 DIAGNOSIS — O34219 Maternal care for unspecified type scar from previous cesarean delivery: Secondary | ICD-10-CM

## 2022-03-15 DIAGNOSIS — O09299 Supervision of pregnancy with other poor reproductive or obstetric history, unspecified trimester: Secondary | ICD-10-CM

## 2022-03-15 DIAGNOSIS — D573 Sickle-cell trait: Secondary | ICD-10-CM

## 2022-03-15 DIAGNOSIS — O09522 Supervision of elderly multigravida, second trimester: Secondary | ICD-10-CM

## 2022-03-15 DIAGNOSIS — Z720 Tobacco use: Secondary | ICD-10-CM

## 2022-03-15 DIAGNOSIS — O99212 Obesity complicating pregnancy, second trimester: Secondary | ICD-10-CM

## 2022-03-16 ENCOUNTER — Other Ambulatory Visit: Payer: Medicaid Other

## 2022-03-16 ENCOUNTER — Ambulatory Visit: Payer: Medicaid Other

## 2022-03-21 ENCOUNTER — Ambulatory Visit (INDEPENDENT_AMBULATORY_CARE_PROVIDER_SITE_OTHER): Payer: Medicaid Other | Admitting: Obstetrics & Gynecology

## 2022-03-21 VITALS — BP 129/85 | HR 84 | Wt 215.0 lb

## 2022-03-21 DIAGNOSIS — Z3A27 27 weeks gestation of pregnancy: Secondary | ICD-10-CM

## 2022-03-21 DIAGNOSIS — Z23 Encounter for immunization: Secondary | ICD-10-CM

## 2022-03-21 DIAGNOSIS — O0990 Supervision of high risk pregnancy, unspecified, unspecified trimester: Secondary | ICD-10-CM

## 2022-03-21 DIAGNOSIS — Z98891 History of uterine scar from previous surgery: Secondary | ICD-10-CM

## 2022-03-21 DIAGNOSIS — Z641 Problems related to multiparity: Secondary | ICD-10-CM

## 2022-03-21 DIAGNOSIS — Z3482 Encounter for supervision of other normal pregnancy, second trimester: Secondary | ICD-10-CM | POA: Diagnosis not present

## 2022-03-21 DIAGNOSIS — O09522 Supervision of elderly multigravida, second trimester: Secondary | ICD-10-CM

## 2022-03-21 DIAGNOSIS — O365990 Maternal care for other known or suspected poor fetal growth, unspecified trimester, not applicable or unspecified: Secondary | ICD-10-CM

## 2022-03-21 DIAGNOSIS — D573 Sickle-cell trait: Secondary | ICD-10-CM

## 2022-03-21 MED ORDER — BLOOD PRESSURE KIT DEVI: 1.0000 | Freq: Once | 0 refills | Status: AC

## 2022-03-22 ENCOUNTER — Encounter (HOSPITAL_BASED_OUTPATIENT_CLINIC_OR_DEPARTMENT_OTHER): Payer: Self-pay | Admitting: Obstetrics & Gynecology

## 2022-03-22 LAB — CBC
Hematocrit: 33.9 % — ABNORMAL LOW (ref 34.0–46.6)
Hemoglobin: 11.3 g/dL (ref 11.1–15.9)
MCH: 31 pg (ref 26.6–33.0)
MCHC: 33.3 g/dL (ref 31.5–35.7)
MCV: 93 fL (ref 79–97)
Platelets: 277 10*3/uL (ref 150–450)
RBC: 3.65 x10E6/uL — ABNORMAL LOW (ref 3.77–5.28)
RDW: 13 % (ref 11.7–15.4)
WBC: 6.5 10*3/uL (ref 3.4–10.8)

## 2022-03-22 LAB — GLUCOSE TOLERANCE, 2 HOURS W/ 1HR
Glucose, 1 hour: 166 mg/dL (ref 70–179)
Glucose, 2 hour: 117 mg/dL (ref 70–152)
Glucose, Fasting: 88 mg/dL (ref 70–91)

## 2022-03-22 LAB — HIV ANTIBODY (ROUTINE TESTING W REFLEX): HIV Screen 4th Generation wRfx: NONREACTIVE

## 2022-03-22 LAB — RPR: RPR Ser Ql: NONREACTIVE

## 2022-03-22 NOTE — Progress Notes (Signed)
   PRENATAL VISIT NOTE  Subjective:  Cheryl Bright is a 37 y.o. [phone] at [redacted]w[redacted]d being seen today for ongoing prenatal care.  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  Patient reports  she is getting very tired at work and having lower back and leg pain.  Feeling she may need to cut back at work.  Discussed possible accommodations we could request.  Pt will discuss with HR at her work .  Contractions: Irregular. Vag. Bleeding: None.  Movement: Present. Denies leaking of fluid.   The following portions of the patient's history were reviewed and updated as appropriate: allergies, current medications, past family history, past medical history, past social history, past surgical history and problem list.   Objective:   Vitals:   03/21/22 1002  BP: 129/85  Pulse: 84  Weight: 215 lb (97.5 kg)    Fetal Status: Fetal Heart Rate (bpm): 135 Fundal Height: 27 cm Movement: Present     General:  Alert, oriented and cooperative. Patient is in no acute distress.  Skin: Skin is warm and dry. No rash noted.   Cardiovascular: Normal heart rate noted  Respiratory: Normal respiratory effort, no problems with respiration noted  Abdomen: Soft, gravid, appropriate for gestational age.  Pain/Pressure: Absent     Pelvic: Cervical exam deferred        Extremities: Normal range of motion.  Edema: None  Mental Status: Normal mood and affect. Normal behavior. Normal judgment and thought content.   Assessment and Plan:  Pregnancy: [id number] at [redacted]w[redacted]d 1. Supervision of high risk pregnancy, antepartumtrimester - on PNV - recheck 4  weeks - CBC - Glucose Tolerance, 2 Hours w/1 Hour - HIV Antibody (routine testing w rflx) - RPR  2. [redacted] weeks gestation of pregnancy  3. History of VBAC - desires same for this pregnancy  4. Sickle cell trait ([place])  5. Fetal growth restriction antepartum - most recent ultrasound showed improved growth and baby is now not IUGR.  However, follow up growth scan scheduled for 3 weeks from prior.  Cord dopplers not currently needed  6. [place] multiparity  7. AMA (advanced maternal age) multigravida 56+, second trimester   Preterm labor symptoms and general obstetric precautions including but not limited to vaginal bleeding, contractions, leaking of fluid and fetal movement were reviewed in detail with the patient. Please refer to After Visit Summary for other counseling recommendations.   Return in about 4 weeks (around 04/18/2022).  Future Appointments  Date Time Provider [place]  04/02/2022 10:45 AM W[facility] NURSE [facility]  04/02/2022 11:00 AM [facility]MFC US1 [facility]  04/09/2022  9:35 AM Megan Salon, MD [facility]  04/24/2022  1:35 PM Leftwich-Kirby, Kathie Dike, CNM [facility]  05/07/2022  1:35 PM Megan Salon, MD [facility]  05/21/2022  1:35 PM Megan Salon, MD [facility]  05/28/2022 10:15 AM Megan Salon, MD [facility]  06/04/2022  1:35 PM Megan Salon, MD [facility]  06/11/2022 10:35 AM Megan Salon, MD [facility]  06/18/2022  1:35 PM Megan Salon, MD [facility]    Megan Salon, MD

## 2022-04-02 ENCOUNTER — Other Ambulatory Visit: Payer: Self-pay | Admitting: *Deleted

## 2022-04-02 ENCOUNTER — Ambulatory Visit: Payer: Medicaid Other | Admitting: *Deleted

## 2022-04-02 ENCOUNTER — Ambulatory Visit: Payer: Medicaid Other | Attending: Maternal & Fetal Medicine

## 2022-04-02 VITALS — BP 129/75 | HR 80

## 2022-04-02 DIAGNOSIS — O99213 Obesity complicating pregnancy, third trimester: Secondary | ICD-10-CM | POA: Diagnosis not present

## 2022-04-02 DIAGNOSIS — O09299 Supervision of pregnancy with other poor reproductive or obstetric history, unspecified trimester: Secondary | ICD-10-CM | POA: Diagnosis present

## 2022-04-02 DIAGNOSIS — O99333 Smoking (tobacco) complicating pregnancy, third trimester: Secondary | ICD-10-CM | POA: Diagnosis not present

## 2022-04-02 DIAGNOSIS — O0990 Supervision of high risk pregnancy, unspecified, unspecified trimester: Secondary | ICD-10-CM

## 2022-04-02 DIAGNOSIS — E669 Obesity, unspecified: Secondary | ICD-10-CM

## 2022-04-02 DIAGNOSIS — O09523 Supervision of elderly multigravida, third trimester: Secondary | ICD-10-CM | POA: Diagnosis not present

## 2022-04-02 DIAGNOSIS — O09522 Supervision of elderly multigravida, second trimester: Secondary | ICD-10-CM | POA: Insufficient documentation

## 2022-04-02 DIAGNOSIS — Z720 Tobacco use: Secondary | ICD-10-CM | POA: Insufficient documentation

## 2022-04-02 DIAGNOSIS — O99212 Obesity complicating pregnancy, second trimester: Secondary | ICD-10-CM | POA: Insufficient documentation

## 2022-04-02 DIAGNOSIS — O99113 Other diseases of the blood and blood-forming organs and certain disorders involving the immune mechanism complicating pregnancy, third trimester: Secondary | ICD-10-CM

## 2022-04-02 DIAGNOSIS — O34219 Maternal care for unspecified type scar from previous cesarean delivery: Secondary | ICD-10-CM

## 2022-04-02 DIAGNOSIS — D573 Sickle-cell trait: Secondary | ICD-10-CM | POA: Diagnosis present

## 2022-04-02 DIAGNOSIS — F17210 Nicotine dependence, cigarettes, uncomplicated: Secondary | ICD-10-CM

## 2022-04-02 DIAGNOSIS — Z3A29 29 weeks gestation of pregnancy: Secondary | ICD-10-CM

## 2022-04-02 DIAGNOSIS — G35 Multiple sclerosis: Secondary | ICD-10-CM

## 2022-04-02 DIAGNOSIS — O99019 Anemia complicating pregnancy, unspecified trimester: Secondary | ICD-10-CM | POA: Insufficient documentation

## 2022-04-02 DIAGNOSIS — O99353 Diseases of the nervous system complicating pregnancy, third trimester: Secondary | ICD-10-CM

## 2022-04-02 DIAGNOSIS — O09293 Supervision of pregnancy with other poor reproductive or obstetric history, third trimester: Secondary | ICD-10-CM

## 2022-04-09 ENCOUNTER — Encounter (HOSPITAL_BASED_OUTPATIENT_CLINIC_OR_DEPARTMENT_OTHER): Payer: Self-pay | Admitting: Obstetrics & Gynecology

## 2022-04-09 ENCOUNTER — Ambulatory Visit (INDEPENDENT_AMBULATORY_CARE_PROVIDER_SITE_OTHER): Payer: Medicaid Other | Admitting: Obstetrics & Gynecology

## 2022-04-09 VITALS — BP 117/75 | HR 77 | Wt 217.2 lb

## 2022-04-09 DIAGNOSIS — G35 Multiple sclerosis: Secondary | ICD-10-CM

## 2022-04-09 DIAGNOSIS — O09522 Supervision of elderly multigravida, second trimester: Secondary | ICD-10-CM

## 2022-04-09 DIAGNOSIS — D573 Sickle-cell trait: Secondary | ICD-10-CM

## 2022-04-09 DIAGNOSIS — Z98891 History of uterine scar from previous surgery: Secondary | ICD-10-CM

## 2022-04-09 DIAGNOSIS — Z641 Problems related to multiparity: Secondary | ICD-10-CM

## 2022-04-09 DIAGNOSIS — O99350 Diseases of the nervous system complicating pregnancy, unspecified trimester: Secondary | ICD-10-CM

## 2022-04-09 DIAGNOSIS — O0990 Supervision of high risk pregnancy, unspecified, unspecified trimester: Secondary | ICD-10-CM

## 2022-04-09 DIAGNOSIS — O365990 Maternal care for other known or suspected poor fetal growth, unspecified trimester, not applicable or unspecified: Secondary | ICD-10-CM

## 2022-04-09 DIAGNOSIS — Z3A30 30 weeks gestation of pregnancy: Secondary | ICD-10-CM

## 2022-04-09 NOTE — Progress Notes (Unsigned)
PRENATAL VISIT NOTE  Subjective:  Cheryl Bright is a 37 y.o. [phone] at [redacted]w[redacted]d being seen today for ongoing prenatal care.  She is currently monitored for the following issues for this high-risk pregnancy and has History of pregnancy induced hypertension; Postpartum hypertension; History of VBAC; Multiple sclerosis during pregnancy ([place]); Supervision of high risk pregnancy, antepartum; Grand multiparity; AMA (advanced maternal age) multigravida 35+, second trimester; Sickle cell trait ([place]); and Fetal growth restriction antepartum on their problem list.  Patient reports she is very tired.  Has a lot of contractions when she works.  Does housekeeping at [employer].  Feels being out of work now is best for her.  Feel this is reasonable.  Asked pt to have any necessary paperwork sent here for me to fill out.  Contractions: Irregular. Vag. Bleeding: None.  Movement: Present. Denies leaking of fluid.   The following portions of the patient's history were reviewed and updated as appropriate: allergies, current medications, past family history, past medical history, past social history, past surgical history and problem list.   Objective:   Vitals:   04/09/22 0959  BP: 117/75  Pulse: 77  Weight: 217 lb 3.2 oz (98.5 kg)    Fetal Status: Fetal Heart Rate (bpm): 153 Fundal Height: 30 cm Movement: Present     General:  Alert, oriented and cooperative. Patient is in no acute distress.  Skin: Skin is warm and dry. No rash noted.   Cardiovascular: Normal heart rate noted  Respiratory: Normal respiratory effort, no problems with respiration noted  Abdomen: Soft, gravid, appropriate for gestational age.  Pain/Pressure: Present     Pelvic: Cervical exam deferred        Extremities: Normal range of motion.  Edema: None  Mental Status: Normal mood and affect. Normal behavior. Normal judgment and thought content.   Assessment and Plan:  Pregnancy: [id number] at [redacted]w[redacted]d 1. Supervision of high risk  pregnancy, antepartum - taking PNV and baby ASA - Recheck 2 weeks  2. History of VBAC - pt planning trial of VBAC again with this pregnancy.  Risks reviewed again today including uterine rupture, bleeding, need for cesarean section, possible non reassuring tracing, fetal stress or demise.  Do feel she is a good candidate for this considering prior successful VBACs  3. Multiple sclerosis during pregnancy ([place]) - last appt was with Dr. Elayne Snare 01/2020  4. [place] multiparity  5. AMA (advanced maternal age) multigravida 20+, second trimester  6. Sickle cell trait ([place])  7. Fetal growth restriction antepartum - last growth scan on 129/2024 with 14%-tile growth.  Repeat growth in 3 weeks is planned.   Preterm labor symptoms and general obstetric precautions including but not limited to vaginal bleeding, contractions, leaking of fluid and fetal movement were reviewed in detail with the patient. Please refer to After Visit Summary for other counseling recommendations.   Return in about 2 weeks (around 04/23/2022).  Future Appointments  Date Time Provider [place]  04/24/2022  1:35 PM Simona Huh [facility]  04/30/2022 10:30 AM [facility] NURSE [facility]  04/30/2022 10:45 AM [facility] US6 [facility]  05/07/2022  1:35 PM Megan Salon, MD [facility]  05/21/2022  1:35 PM Megan Salon, MD [facility]  05/28/2022 10:15 AM Megan Salon, MD [facility]  06/04/2022  1:35 PM Megan Salon, MD [facility]  06/11/2022 10:35 AM Megan Salon, MD [facility]  06/18/2022  1:35 PM Megan Salon, MD [facility]  Megan Salon, MD

## 2022-04-23 ENCOUNTER — Encounter (HOSPITAL_BASED_OUTPATIENT_CLINIC_OR_DEPARTMENT_OTHER): Payer: Medicaid Other | Admitting: Obstetrics & Gynecology

## 2022-04-23 ENCOUNTER — Encounter: Payer: Self-pay | Admitting: *Deleted

## 2022-04-24 ENCOUNTER — Ambulatory Visit (INDEPENDENT_AMBULATORY_CARE_PROVIDER_SITE_OTHER): Payer: Medicaid Other | Admitting: Advanced Practice Midwife

## 2022-04-24 VITALS — BP 118/71 | HR 74 | Wt 217.6 lb

## 2022-04-24 DIAGNOSIS — O99350 Diseases of the nervous system complicating pregnancy, unspecified trimester: Secondary | ICD-10-CM

## 2022-04-24 DIAGNOSIS — G35 Multiple sclerosis: Secondary | ICD-10-CM

## 2022-04-24 DIAGNOSIS — O0990 Supervision of high risk pregnancy, unspecified, unspecified trimester: Secondary | ICD-10-CM

## 2022-04-24 DIAGNOSIS — O09522 Supervision of elderly multigravida, second trimester: Secondary | ICD-10-CM

## 2022-04-24 DIAGNOSIS — O26893 Other specified pregnancy related conditions, third trimester: Secondary | ICD-10-CM

## 2022-04-24 DIAGNOSIS — Z3A32 32 weeks gestation of pregnancy: Secondary | ICD-10-CM

## 2022-04-24 DIAGNOSIS — Z98891 History of uterine scar from previous surgery: Secondary | ICD-10-CM

## 2022-04-24 DIAGNOSIS — R102 Pelvic and perineal pain: Secondary | ICD-10-CM

## 2022-04-24 DIAGNOSIS — O365990 Maternal care for other known or suspected poor fetal growth, unspecified trimester, not applicable or unspecified: Secondary | ICD-10-CM

## 2022-04-24 NOTE — Progress Notes (Signed)
   PRENATAL VISIT NOTE  Subjective:  Cheryl Bright is a 37 y.o. [phone] at 30w3dbeing seen today for ongoing prenatal care.  She is currently monitored for the following issues for this high-risk pregnancy and has History of pregnancy induced hypertension; Postpartum hypertension; History of VBAC; Multiple sclerosis during pregnancy (HH[place]; Supervision of high risk pregnancy, antepartum; Grand multiparity; AMA (advanced maternal age) multigravida 35+, second trimester; Sickle cell trait (HC[place]; and Fetal growth restriction antepartum on their problem list.  Patient reports  pelvic pressure, intermittent,more on right side, resolves with rest .  Contractions: Irregular. Vag. Bleeding: None.  Movement: Present. Denies leaking of fluid.   The following portions of the patient's history were reviewed and updated as appropriate: allergies, current medications, past family history, past medical history, past social history, past surgical history and problem list.   Objective:   Vitals:   04/24/22 1345  BP: 118/71  Pulse: 74  Weight: 217 lb 9.6 oz (98.7 kg)    Fetal Status: Fetal Heart Rate (bpm): 137 Fundal Height: 33 cm Movement: Present     General:  Alert, oriented and cooperative. Patient is in no acute distress.  Skin: Skin is warm and dry. No rash noted.   Cardiovascular: Normal heart rate noted  Respiratory: Normal respiratory effort, no problems with respiration noted  Abdomen: Soft, gravid, appropriate for gestational age.  Pain/Pressure: Present     Pelvic: Cervical exam deferred        Extremities: Normal range of motion.  Edema: None  Mental Status: Normal mood and affect. Normal behavior. Normal judgment and thought content.   Assessment and Plan:  Pregnancy: [id number] 372w3d. Supervision of high risk pregnancy, antepartum --Anticipatory guidance about next visits/weeks of pregnancy given.   2. History of VBAC --x3 following c/s x 1  3. Multiple sclerosis during  pregnancy (HCEd[place]--No s/sx  4. AMA (advanced maternal age) multigravida 3536+second trimester   5. Fetal growth restriction antepartum  --EFW  10% on 03/09/22, then 14% on 1/29, growth Q 4 weeks   6. Pelvic pain affecting pregnancy in third trimester, antepartum --Pain in right lower abdomen c/w round ligament pain, with vaginal and rectal pressure occasionally.  No pattern to pain, irregular and resolves with rest.  --Rest/ice/heat/warm bath/increase PO fluids/Tylenol/pregnancy support belt   - Culture, OB Urine   Preterm labor symptoms and general obstetric precautions including but not limited to vaginal bleeding, contractions, leaking of fluid and fetal movement were reviewed in detail with the patient. Please refer to After Visit Summary for other counseling recommendations.   Return in about 2 weeks (around 05/08/2022) for As scheduled.  Future Appointments  Date Time Provider DeE. Lopez2/26/2024 10:30 AM [facility]2/26/2024 10:45 AM [facility] US6 [facility]/06/2022  1:35 PM MiMegan SalonMD [facility]  05/21/2022  1:35 PM MiMegan SalonMD [facility]  05/28/2022 10:15 AM MiMegan SalonMD [facility]  06/04/2022  1:35 PM MiMegan SalonMD [facility]  06/11/2022 10:35 AM MiMegan SalonMD [facility]  06/18/2022  1:35 PM MiMegan SalonMD [facility]    LiFatima BlankCNM

## 2022-04-27 LAB — CULTURE, OB URINE

## 2022-04-27 LAB — URINE CULTURE, OB REFLEX

## 2022-04-30 ENCOUNTER — Other Ambulatory Visit: Payer: Self-pay | Admitting: *Deleted

## 2022-04-30 ENCOUNTER — Ambulatory Visit: Payer: Medicaid Other | Attending: Maternal & Fetal Medicine

## 2022-04-30 ENCOUNTER — Ambulatory Visit: Payer: Medicaid Other | Admitting: *Deleted

## 2022-04-30 VITALS — BP 132/72 | HR 86

## 2022-04-30 DIAGNOSIS — O99213 Obesity complicating pregnancy, third trimester: Secondary | ICD-10-CM

## 2022-04-30 DIAGNOSIS — O34219 Maternal care for unspecified type scar from previous cesarean delivery: Secondary | ICD-10-CM

## 2022-04-30 DIAGNOSIS — O09523 Supervision of elderly multigravida, third trimester: Secondary | ICD-10-CM | POA: Diagnosis present

## 2022-04-30 DIAGNOSIS — D573 Sickle-cell trait: Secondary | ICD-10-CM

## 2022-04-30 DIAGNOSIS — O09293 Supervision of pregnancy with other poor reproductive or obstetric history, third trimester: Secondary | ICD-10-CM

## 2022-04-30 DIAGNOSIS — O99333 Smoking (tobacco) complicating pregnancy, third trimester: Secondary | ICD-10-CM

## 2022-04-30 DIAGNOSIS — O0990 Supervision of high risk pregnancy, unspecified, unspecified trimester: Secondary | ICD-10-CM

## 2022-04-30 DIAGNOSIS — Z3A33 33 weeks gestation of pregnancy: Secondary | ICD-10-CM

## 2022-04-30 DIAGNOSIS — O99353 Diseases of the nervous system complicating pregnancy, third trimester: Secondary | ICD-10-CM

## 2022-04-30 DIAGNOSIS — G35 Multiple sclerosis: Secondary | ICD-10-CM

## 2022-04-30 DIAGNOSIS — E669 Obesity, unspecified: Secondary | ICD-10-CM

## 2022-05-07 ENCOUNTER — Ambulatory Visit (INDEPENDENT_AMBULATORY_CARE_PROVIDER_SITE_OTHER): Payer: Medicaid Other | Admitting: Obstetrics & Gynecology

## 2022-05-07 VITALS — BP 127/84 | HR 84 | Wt 216.2 lb

## 2022-05-07 DIAGNOSIS — G35 Multiple sclerosis: Secondary | ICD-10-CM

## 2022-05-07 DIAGNOSIS — Z3A34 34 weeks gestation of pregnancy: Secondary | ICD-10-CM

## 2022-05-07 DIAGNOSIS — Z8759 Personal history of other complications of pregnancy, childbirth and the puerperium: Secondary | ICD-10-CM

## 2022-05-07 DIAGNOSIS — O0990 Supervision of high risk pregnancy, unspecified, unspecified trimester: Secondary | ICD-10-CM

## 2022-05-07 DIAGNOSIS — O365990 Maternal care for other known or suspected poor fetal growth, unspecified trimester, not applicable or unspecified: Secondary | ICD-10-CM

## 2022-05-07 DIAGNOSIS — O09522 Supervision of elderly multigravida, second trimester: Secondary | ICD-10-CM

## 2022-05-07 DIAGNOSIS — D573 Sickle-cell trait: Secondary | ICD-10-CM

## 2022-05-07 DIAGNOSIS — R102 Pelvic and perineal pain: Secondary | ICD-10-CM

## 2022-05-07 DIAGNOSIS — O99350 Diseases of the nervous system complicating pregnancy, unspecified trimester: Secondary | ICD-10-CM

## 2022-05-07 DIAGNOSIS — Z641 Problems related to multiparity: Secondary | ICD-10-CM

## 2022-05-07 DIAGNOSIS — O26893 Other specified pregnancy related conditions, third trimester: Secondary | ICD-10-CM

## 2022-05-07 DIAGNOSIS — Z98891 History of uterine scar from previous surgery: Secondary | ICD-10-CM

## 2022-05-07 NOTE — Progress Notes (Signed)
   PRENATAL VISIT NOTE  Subjective:  Cheryl Bright is a 37 y.o. [id number] at 72w2dbeing seen today for ongoing prenatal care.  She is currently monitored for the following issues for this high-risk pregnancy and has History of pregnancy induced hypertension; Postpartum hypertension; History of VBAC; Multiple sclerosis during pregnancy ([place]; Supervision of high risk pregnancy, antepartum; Grand multiparity; AMA (advanced maternal age) multigravida 35+, second trimester; Sickle cell trait (HS[place]; and Fetal growth restriction antepartum on their problem list.  Patient reports  some contractions and lots of back and pelvic pain .  Contractions: Irregular. Vag. Bleeding: None.  Movement: Present. Denies leaking of fluid.   The following portions of the patient's history were reviewed and updated as appropriate: allergies, current medications, past family history, past medical history, past social history, past surgical history and problem list.   Objective:   Vitals:   05/07/22 1348  BP: 127/84  Pulse: 84  Weight: 216 lb 3.2 oz (98.1 kg)    Fetal Status: Fetal Heart Rate (bpm): 148   Movement: Present     General:  Alert, oriented and cooperative. Patient is in no acute distress.  Skin: Skin is warm and dry. No rash noted.   Cardiovascular: Normal heart rate noted  Respiratory: Normal respiratory effort, no problems with respiration noted  Abdomen: Soft, gravid, appropriate for gestational age.  Pain/Pressure: Present     Pelvic: Cervical exam deferred        Extremities: Normal range of motion.  Edema: None  Mental Status: Normal mood and affect. Normal behavior. Normal judgment and thought content.   Assessment and Plan:  Pregnancy: [id number] 324w2d. Supervision of high risk pregnancy, antepartum - on PNV.  Stopped baby ASA.  Recommended she restart.  2. [redacted] weeks gestation of pregnancy  3. AMA (advanced maternal age) multigravida 3559+second trimester  4. [facility] last appt with neurologist was 01/2020  5. [place]arity  6. Sickle cell trait (HCSha[place] 7. History of VBAC - desires VBAC.  Feels she did consent at last visit.  Not in EPIC.  Will need to resign.  8. Fetal growth restriction antepartum - 9th %til growth on 12/15 scan.  Improved to 10th and then 14% tile and has remained that way.  Last growth scan was 2/26.  Scheduled again in 3 weeks.  9. History of pregnancy induced hypertension - blood pressures normal in this pregnancy  10. Pelvic pain affecting pregnancy in third trimester, antepartum - FMLA paperwork filled out for pt today   Preterm labor symptoms and general obstetric precautions including but not limited to vaginal bleeding, contractions, leaking of fluid and fetal movement were reviewed in detail with the patient. Please refer to After Visit Summary for other counseling recommendations.   Return in about 2 weeks (around 05/21/2022).  Future Appointments  Date Time Provider [place]3/18/2024  1:35 PM MiMegan SalonMD [facility]  05/28/2022 10:15 AM MiMegan SalonMD [facility]  05/29/2022 10:15 AM [facility]/26/2024 10:30 AM [facility]4/05/2022  1:35 PM MiMegan SalonMD [facility]  06/11/2022 10:35 AM MiMegan SalonMD [facility]  06/18/2022  1:35 PM MiMegan SalonMD [facility]    MaMegan SalonMD

## 2022-05-14 ENCOUNTER — Encounter (HOSPITAL_BASED_OUTPATIENT_CLINIC_OR_DEPARTMENT_OTHER): Payer: Self-pay | Admitting: *Deleted

## 2022-05-21 ENCOUNTER — Ambulatory Visit (INDEPENDENT_AMBULATORY_CARE_PROVIDER_SITE_OTHER): Payer: Medicaid Other | Admitting: Obstetrics & Gynecology

## 2022-05-21 ENCOUNTER — Other Ambulatory Visit (HOSPITAL_COMMUNITY)
Admission: RE | Admit: 2022-05-21 | Discharge: 2022-05-21 | Disposition: A | Discharge status: Home / Self Care | Payer: Medicaid Other | Source: Ambulatory Visit | Attending: Obstetrics & Gynecology | Admitting: Obstetrics & Gynecology

## 2022-05-21 VITALS — BP 110/78 | HR 85 | Wt 214.0 lb

## 2022-05-21 DIAGNOSIS — G35 Multiple sclerosis: Secondary | ICD-10-CM | POA: Diagnosis not present

## 2022-05-21 DIAGNOSIS — D573 Sickle-cell trait: Secondary | ICD-10-CM

## 2022-05-21 DIAGNOSIS — O0990 Supervision of high risk pregnancy, unspecified, unspecified trimester: Secondary | ICD-10-CM

## 2022-05-21 DIAGNOSIS — Z641 Problems related to multiparity: Secondary | ICD-10-CM

## 2022-05-21 DIAGNOSIS — Z98891 History of uterine scar from previous surgery: Secondary | ICD-10-CM | POA: Insufficient documentation

## 2022-05-21 DIAGNOSIS — O09523 Supervision of elderly multigravida, third trimester: Secondary | ICD-10-CM | POA: Diagnosis not present

## 2022-05-21 DIAGNOSIS — O365930 Maternal care for other known or suspected poor fetal growth, third trimester, not applicable or unspecified: Secondary | ICD-10-CM | POA: Insufficient documentation

## 2022-05-21 DIAGNOSIS — O0993 Supervision of high risk pregnancy, unspecified, third trimester: Secondary | ICD-10-CM

## 2022-05-21 DIAGNOSIS — O365993 Maternal care for other known or suspected poor fetal growth, unspecified trimester, fetus 3: Secondary | ICD-10-CM

## 2022-05-21 DIAGNOSIS — Z3A36 36 weeks gestation of pregnancy: Secondary | ICD-10-CM | POA: Diagnosis not present

## 2022-05-21 DIAGNOSIS — O99353 Diseases of the nervous system complicating pregnancy, third trimester: Secondary | ICD-10-CM | POA: Insufficient documentation

## 2022-05-21 DIAGNOSIS — Z8759 Personal history of other complications of pregnancy, childbirth and the puerperium: Secondary | ICD-10-CM

## 2022-05-21 DIAGNOSIS — O365990 Maternal care for other known or suspected poor fetal growth, unspecified trimester, not applicable or unspecified: Secondary | ICD-10-CM

## 2022-05-21 DIAGNOSIS — O165 Unspecified maternal hypertension, complicating the puerperium: Secondary | ICD-10-CM

## 2022-05-21 DIAGNOSIS — O99350 Diseases of the nervous system complicating pregnancy, unspecified trimester: Secondary | ICD-10-CM

## 2022-05-21 DIAGNOSIS — O09522 Supervision of elderly multigravida, second trimester: Secondary | ICD-10-CM

## 2022-05-21 NOTE — Progress Notes (Signed)
   PRENATAL VISIT NOTE  Subjective:  Cheryl Bright is a 37 y.o. [phone] at [redacted]w[redacted]d being seen today for ongoing prenatal care.  She is currently monitored for the following issues for this high-risk pregnancy and has History of pregnancy induced hypertension; Postpartum hypertension; History of VBAC; Multiple sclerosis during pregnancy ([place]); Supervision of high risk pregnancy, antepartum; Grand multiparity; AMA (advanced maternal age) multigravida 35+, second trimester; Sickle cell trait ([place]); and Fetal growth restriction antepartum on their problem list.  Patient reports no complaints.  Contractions: Irregular. Vag. Bleeding: None.  Movement: Present. Denies leaking of fluid.   The following portions of the patient's history were reviewed and updated as appropriate: allergies, current medications, past family history, past medical history, past social history, past surgical history and problem list.   Objective:   Vitals:   05/21/22 1350  BP: 110/78  Pulse: 85  Weight: 214 lb (97.1 kg)    Fetal Status: Fetal Heart Rate (bpm): 130 Fundal Height: 36 cm Movement: Present  Presentation: Vertex  General:  Alert, oriented and cooperative. Patient is in no acute distress.  Skin: Skin is warm and dry. No rash noted.   Cardiovascular: Normal heart rate noted  Respiratory: Normal respiratory effort, no problems with respiration noted  Abdomen: Soft, gravid, appropriate for gestational age.  Pain/Pressure: Present     Pelvic: Cervical exam deferred        Extremities: Normal range of motion.  Edema: None  Mental Status: Normal mood and affect. Normal behavior. Normal judgment and thought content.   Assessment and Plan:  Pregnancy: [id number] at [redacted]w[redacted]d 1. Supervision of high risk pregnancy, antepartum - GBS and GC/Chl obtained today - on PNV and baby ASA  2. [redacted] weeks gestation of pregnancy  3. Sickle cell trait ([place]) - FOB testing negative  4. Fetal growth restriction antepartum - last  growth scan at 14%-tile.  Has repeat scheduled  5. [place] multiparity  6. History of VBAC - Consent reviewed.  Risks reviewed and consent signed.  7. History of pregnancy induced hypertension  8. Multiple sclerosis during pregnancy ([place])  9. AMA (advanced maternal age) multigravida 60+, second trimester   Preterm labor symptoms and general obstetric precautions including but not limited to vaginal bleeding, contractions, leaking of fluid and fetal movement were reviewed in detail with the patient. Please refer to After Visit Summary for other counseling recommendations.   No follow-ups on file.  Future Appointments  Date Time Provider [place]  05/29/2022 10:15 AM [facility]  05/29/2022 10:30 AM [facility] US2 [facility]  05/30/2022  1:55 PM Megan Salon, MD [facility]  06/06/2022  1:35 PM Megan Salon, MD [facility]  06/11/2022 10:35 AM Megan Salon, MD [facility]  06/18/2022  1:35 PM Megan Salon, MD [facility]    Megan Salon, MD

## 2022-05-22 LAB — CERVICOVAGINAL ANCILLARY ONLY
Chlamydia: NEGATIVE
Comment: NEGATIVE
Comment: NORMAL
Neisseria Gonorrhea: NEGATIVE

## 2022-05-25 LAB — CULTURE, BETA STREP (GROUP B ONLY): Strep Gp B Culture: NEGATIVE

## 2022-05-28 ENCOUNTER — Encounter (HOSPITAL_BASED_OUTPATIENT_CLINIC_OR_DEPARTMENT_OTHER): Payer: Medicaid Other | Admitting: Obstetrics & Gynecology

## 2022-05-29 ENCOUNTER — Ambulatory Visit: Payer: Medicaid Other | Admitting: *Deleted

## 2022-05-29 ENCOUNTER — Ambulatory Visit: Payer: Medicaid Other | Attending: Maternal & Fetal Medicine

## 2022-05-29 VITALS — BP 134/82 | HR 92

## 2022-05-29 DIAGNOSIS — O0990 Supervision of high risk pregnancy, unspecified, unspecified trimester: Secondary | ICD-10-CM | POA: Diagnosis present

## 2022-05-29 DIAGNOSIS — O285 Abnormal chromosomal and genetic finding on antenatal screening of mother: Secondary | ICD-10-CM | POA: Diagnosis not present

## 2022-05-29 DIAGNOSIS — O09523 Supervision of elderly multigravida, third trimester: Secondary | ICD-10-CM

## 2022-05-29 DIAGNOSIS — O09293 Supervision of pregnancy with other poor reproductive or obstetric history, third trimester: Secondary | ICD-10-CM

## 2022-05-29 DIAGNOSIS — G35 Multiple sclerosis: Secondary | ICD-10-CM

## 2022-05-29 DIAGNOSIS — O99333 Smoking (tobacco) complicating pregnancy, third trimester: Secondary | ICD-10-CM

## 2022-05-29 DIAGNOSIS — E669 Obesity, unspecified: Secondary | ICD-10-CM

## 2022-05-29 DIAGNOSIS — D573 Sickle-cell trait: Secondary | ICD-10-CM | POA: Diagnosis not present

## 2022-05-29 DIAGNOSIS — O99213 Obesity complicating pregnancy, third trimester: Secondary | ICD-10-CM | POA: Diagnosis present

## 2022-05-29 DIAGNOSIS — O34219 Maternal care for unspecified type scar from previous cesarean delivery: Secondary | ICD-10-CM

## 2022-05-29 DIAGNOSIS — O99353 Diseases of the nervous system complicating pregnancy, third trimester: Secondary | ICD-10-CM

## 2022-05-29 DIAGNOSIS — Z3A37 37 weeks gestation of pregnancy: Secondary | ICD-10-CM

## 2022-05-29 DIAGNOSIS — O0943 Supervision of pregnancy with grand multiparity, third trimester: Secondary | ICD-10-CM

## 2022-05-30 ENCOUNTER — Ambulatory Visit (INDEPENDENT_AMBULATORY_CARE_PROVIDER_SITE_OTHER): Payer: Medicaid Other | Admitting: Obstetrics & Gynecology

## 2022-05-30 VITALS — BP 126/82 | HR 85 | Wt 219.6 lb

## 2022-05-30 DIAGNOSIS — D573 Sickle-cell trait: Secondary | ICD-10-CM

## 2022-05-30 DIAGNOSIS — O09523 Supervision of elderly multigravida, third trimester: Secondary | ICD-10-CM

## 2022-05-30 DIAGNOSIS — O99350 Diseases of the nervous system complicating pregnancy, unspecified trimester: Secondary | ICD-10-CM

## 2022-05-30 DIAGNOSIS — O0990 Supervision of high risk pregnancy, unspecified, unspecified trimester: Secondary | ICD-10-CM

## 2022-05-30 DIAGNOSIS — G35 Multiple sclerosis: Secondary | ICD-10-CM

## 2022-05-30 DIAGNOSIS — Z8759 Personal history of other complications of pregnancy, childbirth and the puerperium: Secondary | ICD-10-CM

## 2022-05-30 DIAGNOSIS — Z641 Problems related to multiparity: Secondary | ICD-10-CM

## 2022-05-30 DIAGNOSIS — O365990 Maternal care for other known or suspected poor fetal growth, unspecified trimester, not applicable or unspecified: Secondary | ICD-10-CM

## 2022-05-30 DIAGNOSIS — Z98891 History of uterine scar from previous surgery: Secondary | ICD-10-CM

## 2022-05-30 DIAGNOSIS — Z3A37 37 weeks gestation of pregnancy: Secondary | ICD-10-CM

## 2022-05-30 NOTE — Progress Notes (Signed)
   PRENATAL VISIT NOTE  Subjective:  Cheryl Bright is a 37 y.o. [id number] at [redacted]w[redacted]d being seen today for ongoing prenatal care.  She is currently monitored for the following issues for this high-risk pregnancy and has History of pregnancy induced hypertension; Postpartum hypertension; History of VBAC; Multiple sclerosis during pregnancy ([place]); Supervision of high risk pregnancy, antepartum; Grand multiparity; AMA (advanced maternal age) multigravida 35+, second trimester; Sickle cell trait ([place]); and Fetal growth restriction antepartum on their problem list.  Patient reports no complaints.  Contractions: Irregular. Vag. Bleeding: None.  Movement: Present. Denies leaking of fluid.   The following portions of the patient's history were reviewed and updated as appropriate: allergies, current medications, past family history, past medical history, past social history, past surgical history and problem list.   Objective:   Vitals:   05/30/22 1413  BP: 126/82  Pulse: 85  Weight: 219 lb 9.6 oz (99.6 kg)    Fetal Status: Fetal Heart Rate (bpm): 136   Movement: Present  Presentation: Vertex  General:  Alert, oriented and cooperative. Patient is in no acute distress.  Skin: Skin is warm and dry. No rash noted.   Cardiovascular: Normal heart rate noted  Respiratory: Normal respiratory effort, no problems with respiration noted  Abdomen: Soft, gravid, appropriate for gestational age.  Pain/Pressure: Absent     Pelvic: Cervical exam performed in the presence of a chaperone Dilation: 1.5 Effacement (%): 20 Station: -3  Extremities: Normal range of motion.  Edema: None  Mental Status: Normal mood and affect. Normal behavior. Normal judgment and thought content.   Assessment and Plan:  Pregnancy: [id number] at [redacted]w[redacted]d 1. Supervision of high risk pregnancy, antepartum - on PNV.  Has stopped baby ASA - Recheck 1 week  2. [redacted] weeks gestation of pregnancy  3. History of VBAC - planning VBAC  4. [place]  multiparity  5. Multiple sclerosis during pregnancy ([place])  6. AMA (advanced maternal age) multigravida 62+, third trimester  7. Sickle cell trait ([facility]) - FOB has not been tested  8. Fetal growth restriction antepartum - most recent growth scan (05/29/2022) at 16th %-tile.  No additional follow up for this recommended  9. History of pregnancy induced hypertension - stable blood pressure  Term labor symptoms and general obstetric precautions including but not limited to vaginal bleeding, contractions, leaking of fluid and fetal movement were reviewed in detail with the patient. Please refer to After Visit Summary for other counseling recommendations.   Return in about 1 week (around 06/06/2022).  Future Appointments  Date Time Provider [place]  06/06/2022  1:35 PM Megan Salon, MD [facility]  06/11/2022 10:35 AM Megan Salon, MD [facility]  06/18/2022  1:35 PM Megan Salon, MD [facility]    Megan Salon, MD

## 2022-06-04 ENCOUNTER — Encounter (HOSPITAL_BASED_OUTPATIENT_CLINIC_OR_DEPARTMENT_OTHER): Payer: Medicaid Other | Admitting: Obstetrics & Gynecology

## 2022-06-06 ENCOUNTER — Ambulatory Visit (INDEPENDENT_AMBULATORY_CARE_PROVIDER_SITE_OTHER): Payer: Medicaid Other | Admitting: Obstetrics & Gynecology

## 2022-06-06 VITALS — BP 124/67 | HR 87 | Wt 221.4 lb

## 2022-06-06 DIAGNOSIS — D573 Sickle-cell trait: Secondary | ICD-10-CM

## 2022-06-06 DIAGNOSIS — Z98891 History of uterine scar from previous surgery: Secondary | ICD-10-CM

## 2022-06-06 DIAGNOSIS — O99350 Diseases of the nervous system complicating pregnancy, unspecified trimester: Secondary | ICD-10-CM

## 2022-06-06 DIAGNOSIS — O365990 Maternal care for other known or suspected poor fetal growth, unspecified trimester, not applicable or unspecified: Secondary | ICD-10-CM

## 2022-06-06 DIAGNOSIS — Z3A38 38 weeks gestation of pregnancy: Secondary | ICD-10-CM

## 2022-06-06 DIAGNOSIS — Z641 Problems related to multiparity: Secondary | ICD-10-CM

## 2022-06-06 DIAGNOSIS — G35 Multiple sclerosis: Secondary | ICD-10-CM

## 2022-06-06 DIAGNOSIS — O0990 Supervision of high risk pregnancy, unspecified, unspecified trimester: Secondary | ICD-10-CM

## 2022-06-06 DIAGNOSIS — Z8759 Personal history of other complications of pregnancy, childbirth and the puerperium: Secondary | ICD-10-CM

## 2022-06-06 DIAGNOSIS — O09522 Supervision of elderly multigravida, second trimester: Secondary | ICD-10-CM

## 2022-06-06 NOTE — Progress Notes (Unsigned)
   PRENATAL VISIT NOTE  Subjective:  Cheryl Bright is a 37 y.o. [id number] at [redacted]w[redacted]d being seen today for ongoing prenatal care.  She is currently monitored for the following issues for this high-risk pregnancy and has History of pregnancy induced hypertension; Postpartum hypertension; History of VBAC; Multiple sclerosis during pregnancy; Supervision of high risk pregnancy, antepartum; Grand multiparity; AMA (advanced maternal age) multigravida 73+, second trimester; Sickle cell trait; and Fetal growth restriction antepartum on their problem list.  Patient reports  some contractions today.  Thought maybe she needed to go to the hospital .  Contractions: Irregular. Vag. Bleeding: None.  Movement: Present. Denies leaking of fluid.   The following portions of the patient's history were reviewed and updated as appropriate: allergies, current medications, past family history, past medical history, past social history, past surgical history and problem list.   Objective:   Vitals:   06/06/22 1349  BP: 124/67  Pulse: 87  Weight: 221 lb 6.4 oz (100.4 kg)    Fetal Status: Fetal Bright Rate (bpm): 142 Fundal Height: 37 cm Movement: Present  Presentation: Vertex  General:  Alert, oriented and cooperative. Patient is in no acute distress.  Skin: Skin is warm and dry. No rash noted.   Cardiovascular: Normal Bright rate noted  Respiratory: Normal respiratory effort, no problems with respiration noted  Abdomen: Soft, gravid, appropriate for gestational age.  Pain/Pressure: Present     Pelvic: Cervical exam performed in the presence of a chaperone Dilation: 1.5 Effacement (%): 20 Station: -3  Extremities: Normal range of motion.  Edema: None  Mental Status: Normal mood and affect. Normal behavior. Normal judgment and thought content.   Assessment and Plan:  Pregnancy: [id number] at [redacted]w[redacted]d 1. Supervision of high risk pregnancy, antepartum - on PNV and baby ASA  2. [redacted] weeks gestation of pregnancy - recheck 1  week  3. History of VBAC - consent has been signed and is in chart - pt also desires BTL and papers signed 03/02/2022  4. [place] multiparity  5. Sickle cell trait  6. Fetal growth restriction antepartum - most recent growth scan at 16% tile  7. AMA (advanced maternal age) multigravida 69+, second trimester  8. History of pregnancy induced hypertension - normal BPs today  Term labor symptoms and general obstetric precautions including but not limited to vaginal bleeding, contractions, leaking of fluid and fetal movement were reviewed in detail with the patient. Please refer to After Visit Summary for other counseling recommendations.   No follow-ups on file.  Future Appointments  Date Time Provider [place]  06/11/2022 10:35 AM Megan Salon, MD [facility]  06/18/2022  1:35 PM Megan Salon, MD [facility]    Megan Salon, MD

## 2022-06-11 ENCOUNTER — Ambulatory Visit (INDEPENDENT_AMBULATORY_CARE_PROVIDER_SITE_OTHER): Payer: Medicaid Other | Admitting: Obstetrics & Gynecology

## 2022-06-11 ENCOUNTER — Telehealth (HOSPITAL_BASED_OUTPATIENT_CLINIC_OR_DEPARTMENT_OTHER): Payer: Self-pay | Admitting: Obstetrics & Gynecology

## 2022-06-11 VITALS — BP 124/82 | HR 79 | Wt 219.6 lb

## 2022-06-11 DIAGNOSIS — Z3A39 39 weeks gestation of pregnancy: Secondary | ICD-10-CM

## 2022-06-11 DIAGNOSIS — O09522 Supervision of elderly multigravida, second trimester: Secondary | ICD-10-CM

## 2022-06-11 DIAGNOSIS — O0990 Supervision of high risk pregnancy, unspecified, unspecified trimester: Secondary | ICD-10-CM

## 2022-06-11 DIAGNOSIS — G35 Multiple sclerosis: Secondary | ICD-10-CM

## 2022-06-11 DIAGNOSIS — D573 Sickle-cell trait: Secondary | ICD-10-CM

## 2022-06-11 DIAGNOSIS — Z641 Problems related to multiparity: Secondary | ICD-10-CM

## 2022-06-11 DIAGNOSIS — Z8759 Personal history of other complications of pregnancy, childbirth and the puerperium: Secondary | ICD-10-CM

## 2022-06-11 DIAGNOSIS — O365990 Maternal care for other known or suspected poor fetal growth, unspecified trimester, not applicable or unspecified: Secondary | ICD-10-CM

## 2022-06-11 DIAGNOSIS — Z6838 Body mass index (BMI) 38.0-38.9, adult: Secondary | ICD-10-CM

## 2022-06-11 DIAGNOSIS — Z98891 History of uterine scar from previous surgery: Secondary | ICD-10-CM

## 2022-06-11 NOTE — Telephone Encounter (Addendum)
Called patient  and left a message to please call the office back about today's appt

## 2022-06-14 DIAGNOSIS — Z6838 Body mass index (BMI) 38.0-38.9, adult: Secondary | ICD-10-CM | POA: Insufficient documentation

## 2022-06-14 NOTE — Progress Notes (Signed)
   PRENATAL VISIT NOTE  Subjective:  Cheryl Bright is a 37 y.o. [phone] at [redacted]w[redacted]d being seen today for ongoing prenatal care.  She is currently monitored for the following issues for this high-risk pregnancy and has History of pregnancy induced hypertension; History of VBAC; Multiple sclerosis during pregnancy; Supervision of high risk pregnancy, antepartum; Grand multiparity; AMA (advanced maternal age) multigravida 35+, second trimester; Sickle cell trait; Fetal growth restriction antepartum; and BMI 38.0-38.9,adult on their problem list.  Patient reports no complaints and just ready to have her baby .  Contractions: Not present. Vag. Bleeding: None.  Movement: Present. Denies leaking of fluid.   The following portions of the patient's history were reviewed and updated as appropriate: allergies, current medications, past family history, past medical history, past social history, past surgical history and problem list.   Objective:   Vitals:   06/11/22 1136  BP: 124/82  Pulse: 79  Weight: 219 lb 9.6 oz (99.6 kg)    Fetal Status: Fetal Heart Rate (bpm): 141 Fundal Height: 38 cm Movement: Present  Presentation: Vertex  General:  Alert, oriented and cooperative. Patient is in no acute distress.  Skin: Skin is warm and dry. No rash noted.   Cardiovascular: Normal heart rate noted  Respiratory: Normal respiratory effort, no problems with respiration noted  Abdomen: Soft, gravid, appropriate for gestational age.  Pain/Pressure: Absent     Pelvic: Cervical exam deferred        Extremities: Normal range of motion.  Edema: Trace  Mental Status: Normal mood and affect. Normal behavior. Normal judgment and thought content.   Assessment and Plan:  Pregnancy: [id number] at [redacted]w[redacted]d 1. Supervision of high risk pregnancy, antepartum - on PNV - recheck 1 week - induction planned at 41 weeks  2. [redacted] weeks gestation of pregnancy - will plan NST x 2 next weeks  3. History of VBAC - desires VBAC again.   Consent signed 05/04/2022  4. MS (multiple sclerosis) - no current symptoms.  Last app with Dr. Zena Amos was 11/21  5. Grand multiparity  6. History of pregnancy induced hypertension - has had normal blood pressures with this pregnancy  7. AMA (advanced maternal age) multigravida 35+, second trimester  8. Sickle cell trait  9. Fetal growth restriction antepartum - ultrasounds 1/29, 2/26, and 3/26 all >10th percentile.    10. BMI 38.0-38.9,adult  Term labor symptoms and general obstetric precautions including but not limited to vaginal bleeding, contractions, leaking of fluid and fetal movement were reviewed in detail with the patient. Please refer to After Visit Summary for other counseling recommendations.   Return in about 1 week (around 06/18/2022).  Future Appointments  Date Time Provider Department Center  06/18/2022  1:35 PM Jerene Bears, MD [facility]    Jerene Bears, MD

## 2022-06-18 ENCOUNTER — Telehealth (HOSPITAL_COMMUNITY): Payer: Self-pay | Admitting: *Deleted

## 2022-06-18 ENCOUNTER — Ambulatory Visit (INDEPENDENT_AMBULATORY_CARE_PROVIDER_SITE_OTHER): Payer: Medicaid Other | Admitting: Obstetrics & Gynecology

## 2022-06-18 ENCOUNTER — Encounter (HOSPITAL_COMMUNITY): Payer: Self-pay

## 2022-06-18 VITALS — BP 126/87 | HR 85 | Wt 218.4 lb

## 2022-06-18 DIAGNOSIS — O365990 Maternal care for other known or suspected poor fetal growth, unspecified trimester, not applicable or unspecified: Secondary | ICD-10-CM

## 2022-06-18 DIAGNOSIS — O99350 Diseases of the nervous system complicating pregnancy, unspecified trimester: Secondary | ICD-10-CM

## 2022-06-18 DIAGNOSIS — O09522 Supervision of elderly multigravida, second trimester: Secondary | ICD-10-CM

## 2022-06-18 DIAGNOSIS — O0990 Supervision of high risk pregnancy, unspecified, unspecified trimester: Secondary | ICD-10-CM

## 2022-06-18 DIAGNOSIS — Z98891 History of uterine scar from previous surgery: Secondary | ICD-10-CM

## 2022-06-18 DIAGNOSIS — Z6838 Body mass index (BMI) 38.0-38.9, adult: Secondary | ICD-10-CM

## 2022-06-18 DIAGNOSIS — Z641 Problems related to multiparity: Secondary | ICD-10-CM

## 2022-06-18 DIAGNOSIS — Z3A40 40 weeks gestation of pregnancy: Secondary | ICD-10-CM

## 2022-06-18 DIAGNOSIS — G35 Multiple sclerosis: Secondary | ICD-10-CM

## 2022-06-18 DIAGNOSIS — D573 Sickle-cell trait: Secondary | ICD-10-CM

## 2022-06-18 DIAGNOSIS — Z8759 Personal history of other complications of pregnancy, childbirth and the puerperium: Secondary | ICD-10-CM

## 2022-06-18 NOTE — Telephone Encounter (Signed)
Preadmission screen  

## 2022-06-19 ENCOUNTER — Telehealth (HOSPITAL_COMMUNITY): Payer: Self-pay | Admitting: *Deleted

## 2022-06-19 ENCOUNTER — Other Ambulatory Visit: Payer: Self-pay | Admitting: Advanced Practice Midwife

## 2022-06-19 ENCOUNTER — Encounter: Payer: Self-pay | Admitting: Advanced Practice Midwife

## 2022-06-19 ENCOUNTER — Other Ambulatory Visit (HOSPITAL_BASED_OUTPATIENT_CLINIC_OR_DEPARTMENT_OTHER): Payer: Self-pay | Admitting: Obstetrics & Gynecology

## 2022-06-19 DIAGNOSIS — O480 Post-term pregnancy: Secondary | ICD-10-CM

## 2022-06-19 NOTE — Telephone Encounter (Signed)
Preadmission screen  

## 2022-06-19 NOTE — Progress Notes (Signed)
   PRENATAL VISIT NOTE  Subjective:  Cheryl Bright is a 37 y.o. [id number] at [redacted]w[redacted]d being seen today for ongoing prenatal care.  She is currently monitored for the following issues for this high-risk pregnancy and has History of pregnancy induced hypertension; History of VBAC; Multiple sclerosis during pregnancy; Supervision of high risk pregnancy, antepartum; Grand multiparity; AMA (advanced maternal age) multigravida 35+, second trimester; Sickle cell trait; Fetal growth restriction antepartum; and BMI 38.0-38.9,adult on their problem list.  Patient reports  she's ready to have her baby .  Contractions: Not present. Vag. Bleeding: None.  Movement: Present. Denies leaking of fluid.   The following portions of the patient's history were reviewed and updated as appropriate: allergies, current medications, past family history, past medical history, past social history, past surgical history and problem list.   Objective:   Vitals:   06/18/22 1416  BP: 126/87  Pulse: 85  Weight: 218 lb 6.4 oz (99.1 kg)    Fetal Status: Fetal Heart Rate (bpm): 145 Fundal Height: 40 cm Movement: Present  Presentation: Vertex  General:  Alert, oriented and cooperative. Patient is in no acute distress.  Skin: Skin is warm and dry. No rash noted.   Cardiovascular: Normal heart rate noted  Respiratory: Normal respiratory effort, no problems with respiration noted  Abdomen: Soft, gravid, appropriate for gestational age.  Pain/Pressure: Present     Pelvic: Cervical exam performed in the presence of a chaperone Dilation: 4.5 Effacement (%): 70 Station: -3  Extremities: Normal range of motion.  Edema: Trace  Mental Status: Normal mood and affect. Normal behavior. Normal judgment and thought content.   NST today was reactive. AFI normal.  Assessment and Plan:  Pregnancy: [id number] at [redacted]w[redacted]d 1. Supervision of high risk pregnancy, antepartum - on PNV - induction scheduled for pt for 06/21/2022  2. History of  pregnancy induced hypertension - has had normal blood pressures with this pregnancy  3. History of VBAC - desires again and consent have been obtained  4. Multiple sclerosis during pregnancy - has not seen neurology since 2021  5. AMA (advanced maternal age) multigravida 35+, second trimester  6. Grand multiparity  7. Fetal growth restriction antepartum - resolved but was followed by MFM with growth scans  8. BMI 38.0-38.9,adult  9. Sickle cell trait  Term labor symptoms and general obstetric precautions including but not limited to vaginal bleeding, contractions, leaking of fluid and fetal movement were reviewed in detail with the patient. Please refer to After Visit Summary for other counseling recommendations.   Induction has been scheduled for pt for 06/21/2022  Future Appointments  Date Time Provider Department Center  06/21/2022  6:45 AM [facility] SCHED ROOM [facility]INDC None  06/21/2022 10:30 AM [facility] OBGYN NURSE [facility]    Jerene Bears, MD

## 2022-06-20 ENCOUNTER — Inpatient Hospital Stay (HOSPITAL_COMMUNITY)
Admission: AD | Admit: 2022-06-20 | Discharge: 2022-06-21 | DRG: 806 | Disposition: A | Discharge status: Home / Self Care | Payer: Medicaid Other | Attending: Family Medicine | Admitting: Family Medicine

## 2022-06-20 ENCOUNTER — Other Ambulatory Visit: Payer: Self-pay

## 2022-06-20 ENCOUNTER — Telehealth (HOSPITAL_COMMUNITY): Payer: Self-pay | Admitting: *Deleted

## 2022-06-20 ENCOUNTER — Encounter (HOSPITAL_COMMUNITY): Payer: Self-pay | Admitting: Obstetrics and Gynecology

## 2022-06-20 DIAGNOSIS — O9902 Anemia complicating childbirth: Secondary | ICD-10-CM | POA: Diagnosis present

## 2022-06-20 DIAGNOSIS — F17210 Nicotine dependence, cigarettes, uncomplicated: Secondary | ICD-10-CM | POA: Diagnosis present

## 2022-06-20 DIAGNOSIS — O365990 Maternal care for other known or suspected poor fetal growth, unspecified trimester, not applicable or unspecified: Secondary | ICD-10-CM | POA: Diagnosis present

## 2022-06-20 DIAGNOSIS — Z5986 Financial insecurity: Secondary | ICD-10-CM | POA: Diagnosis not present

## 2022-06-20 DIAGNOSIS — O0990 Supervision of high risk pregnancy, unspecified, unspecified trimester: Principal | ICD-10-CM

## 2022-06-20 DIAGNOSIS — D573 Sickle-cell trait: Secondary | ICD-10-CM | POA: Diagnosis present

## 2022-06-20 DIAGNOSIS — D62 Acute posthemorrhagic anemia: Secondary | ICD-10-CM | POA: Diagnosis not present

## 2022-06-20 DIAGNOSIS — G35 Multiple sclerosis: Secondary | ICD-10-CM | POA: Diagnosis present

## 2022-06-20 DIAGNOSIS — Z98891 History of uterine scar from previous surgery: Secondary | ICD-10-CM

## 2022-06-20 DIAGNOSIS — O139 Gestational [pregnancy-induced] hypertension without significant proteinuria, unspecified trimester: Secondary | ICD-10-CM | POA: Insufficient documentation

## 2022-06-20 DIAGNOSIS — G35D Multiple sclerosis, unspecified: Secondary | ICD-10-CM | POA: Diagnosis present

## 2022-06-20 DIAGNOSIS — O134 Gestational [pregnancy-induced] hypertension without significant proteinuria, complicating childbirth: Secondary | ICD-10-CM | POA: Diagnosis present

## 2022-06-20 DIAGNOSIS — O365930 Maternal care for other known or suspected poor fetal growth, third trimester, not applicable or unspecified: Secondary | ICD-10-CM | POA: Diagnosis present

## 2022-06-20 DIAGNOSIS — O99350 Diseases of the nervous system complicating pregnancy, unspecified trimester: Secondary | ICD-10-CM | POA: Diagnosis present

## 2022-06-20 DIAGNOSIS — O09523 Supervision of elderly multigravida, third trimester: Secondary | ICD-10-CM | POA: Diagnosis not present

## 2022-06-20 DIAGNOSIS — O480 Post-term pregnancy: Principal | ICD-10-CM | POA: Diagnosis present

## 2022-06-20 DIAGNOSIS — Z641 Problems related to multiparity: Secondary | ICD-10-CM

## 2022-06-20 DIAGNOSIS — Z8759 Personal history of other complications of pregnancy, childbirth and the puerperium: Secondary | ICD-10-CM | POA: Insufficient documentation

## 2022-06-20 DIAGNOSIS — O99354 Diseases of the nervous system complicating childbirth: Secondary | ICD-10-CM | POA: Diagnosis present

## 2022-06-20 DIAGNOSIS — O09522 Supervision of elderly multigravida, second trimester: Secondary | ICD-10-CM | POA: Diagnosis present

## 2022-06-20 DIAGNOSIS — O34219 Maternal care for unspecified type scar from previous cesarean delivery: Secondary | ICD-10-CM | POA: Diagnosis present

## 2022-06-20 DIAGNOSIS — O99334 Smoking (tobacco) complicating childbirth: Secondary | ICD-10-CM | POA: Diagnosis present

## 2022-06-20 DIAGNOSIS — O34211 Maternal care for low transverse scar from previous cesarean delivery: Secondary | ICD-10-CM | POA: Diagnosis not present

## 2022-06-20 DIAGNOSIS — Z3A40 40 weeks gestation of pregnancy: Secondary | ICD-10-CM

## 2022-06-20 LAB — CBC
HCT: 33.1 % — ABNORMAL LOW (ref 36.0–46.0)
Hemoglobin: 11.8 g/dL — ABNORMAL LOW (ref 12.0–15.0)
MCH: 32 pg (ref 26.0–34.0)
MCHC: 35.6 g/dL (ref 30.0–36.0)
MCV: 89.7 fL (ref 80.0–100.0)
Platelets: 316 10*3/uL (ref 150–400)
RBC: 3.69 MIL/uL — ABNORMAL LOW (ref 3.87–5.11)
RDW: 14.4 % (ref 11.5–15.5)
WBC: 9.6 10*3/uL (ref 4.0–10.5)
nRBC: 0 % (ref 0.0–0.2)

## 2022-06-20 LAB — COMPREHENSIVE METABOLIC PANEL
ALT: 31 U/L (ref 0–44)
AST: 19 U/L (ref 15–41)
Albumin: 3.1 g/dL — ABNORMAL LOW (ref 3.5–5.0)
Alkaline Phosphatase: 108 U/L (ref 38–126)
Anion gap: 12 (ref 5–15)
BUN: 8 mg/dL (ref 6–20)
CO2: 16 mmol/L — ABNORMAL LOW (ref 22–32)
Calcium: 9 mg/dL (ref 8.9–10.3)
Chloride: 107 mmol/L (ref 98–111)
Creatinine, Ser: 0.74 mg/dL (ref 0.44–1.00)
GFR, Estimated: >60 mL/min (ref >60)
Glucose, Bld: 91 mg/dL (ref 70–99)
Potassium: 4.5 mmol/L (ref 3.5–5.1)
Sodium: 135 mmol/L (ref 135–145)
Total Bilirubin: 0.1 mg/dL — ABNORMAL LOW (ref 0.3–1.2)
Total Protein: 6.7 g/dL (ref 6.5–8.1)

## 2022-06-20 LAB — TYPE AND SCREEN
ABO/RH(D): O POS
Antibody Screen: NEGATIVE

## 2022-06-20 MED ORDER — FUROSEMIDE 20 MG PO TABS
20.0000 mg | ORAL_TABLET | Freq: Every day | ORAL | Status: DC
  Administered 2022-06-21: 20 mg via ORAL
  Filled 2022-06-20: qty 1

## 2022-06-20 MED ORDER — SODIUM CHLORIDE 0.9% FLUSH: 3.0000 mL | INTRAVENOUS | Status: DC | PRN

## 2022-06-20 MED ORDER — LACTATED RINGERS IV SOLN: INTRAVENOUS | Status: DC

## 2022-06-20 MED ORDER — LIDOCAINE HCL (PF) 1 % IJ SOLN: 30.0000 mL | INTRAMUSCULAR | Status: DC | PRN

## 2022-06-20 MED ORDER — SOD CITRATE-CITRIC ACID 500-334 MG/5ML PO SOLN: 30.0000 mL | ORAL | Status: DC | PRN

## 2022-06-20 MED ORDER — ZOLPIDEM TARTRATE 5 MG PO TABS: 5.0000 mg | ORAL_TABLET | Freq: Every evening | ORAL | Status: DC | PRN

## 2022-06-20 MED ORDER — OXYTOCIN BOLUS FROM INFUSION
333.0000 mL | Freq: Once | INTRAVENOUS | Status: AC
  Administered 2022-06-20: 333 mL via INTRAVENOUS

## 2022-06-20 MED ORDER — SIMETHICONE 80 MG PO CHEW: 80.0000 mg | CHEWABLE_TABLET | ORAL | Status: DC | PRN

## 2022-06-20 MED ORDER — TRANEXAMIC ACID-NACL 1000-0.7 MG/100ML-% IV SOLN
INTRAVENOUS | Status: AC
  Administered 2022-06-20: 1000 mg via INTRAVENOUS
  Filled 2022-06-20: qty 100

## 2022-06-20 MED ORDER — SENNOSIDES-DOCUSATE SODIUM 8.6-50 MG PO TABS
2.0000 | ORAL_TABLET | ORAL | Status: DC
  Administered 2022-06-21: 2 via ORAL
  Filled 2022-06-20: qty 2

## 2022-06-20 MED ORDER — ACETAMINOPHEN 325 MG PO TABS: 650.0000 mg | ORAL_TABLET | ORAL | Status: DC | PRN

## 2022-06-20 MED ORDER — OXYTOCIN-SODIUM CHLORIDE 30-0.9 UT/500ML-% IV SOLN
2.5000 [IU]/h | INTRAVENOUS | Status: DC
  Filled 2022-06-20: qty 500

## 2022-06-20 MED ORDER — NIFEDIPINE ER OSMOTIC RELEASE 30 MG PO TB24
30.0000 mg | ORAL_TABLET | Freq: Every day | ORAL | Status: DC
  Administered 2022-06-20 – 2022-06-21 (×2): 30 mg via ORAL
  Filled 2022-06-20 (×2): qty 1

## 2022-06-20 MED ORDER — OXYCODONE-ACETAMINOPHEN 5-325 MG PO TABS: 1.0000 | ORAL_TABLET | ORAL | Status: DC | PRN

## 2022-06-20 MED ORDER — TRANEXAMIC ACID-NACL 1000-0.7 MG/100ML-% IV SOLN: 1000.0000 mg | Freq: Once | INTRAVENOUS | Status: AC

## 2022-06-20 MED ORDER — OXYTOCIN BOLUS FROM INFUSION: 333.0000 mL | Freq: Once | INTRAVENOUS | Status: DC

## 2022-06-20 MED ORDER — ONDANSETRON HCL 4 MG/2ML IJ SOLN: 4.0000 mg | Freq: Four times a day (QID) | INTRAMUSCULAR | Status: DC | PRN

## 2022-06-20 MED ORDER — OXYCODONE-ACETAMINOPHEN 5-325 MG PO TABS: 2.0000 | ORAL_TABLET | ORAL | Status: DC | PRN

## 2022-06-20 MED ORDER — ONDANSETRON HCL 4 MG PO TABS: 4.0000 mg | ORAL_TABLET | ORAL | Status: DC | PRN

## 2022-06-20 MED ORDER — FLEET ENEMA 7-19 GM/118ML RE ENEM: 1.0000 | ENEMA | RECTAL | Status: DC | PRN

## 2022-06-20 MED ORDER — DIBUCAINE (PERIANAL) 1 % EX OINT: 1.0000 | TOPICAL_OINTMENT | CUTANEOUS | Status: DC | PRN

## 2022-06-20 MED ORDER — IBUPROFEN 600 MG PO TABS
600.0000 mg | ORAL_TABLET | Freq: Four times a day (QID) | ORAL | Status: DC
  Administered 2022-06-20 – 2022-06-21 (×4): 600 mg via ORAL
  Filled 2022-06-20 (×4): qty 1

## 2022-06-20 MED ORDER — PRENATAL MULTIVITAMIN CH
1.0000 | ORAL_TABLET | Freq: Every day | ORAL | Status: DC
  Administered 2022-06-20 – 2022-06-21 (×2): 1 via ORAL
  Filled 2022-06-20 (×2): qty 1

## 2022-06-20 MED ORDER — DIPHENHYDRAMINE HCL 25 MG PO CAPS: 25.0000 mg | ORAL_CAPSULE | Freq: Four times a day (QID) | ORAL | Status: DC | PRN

## 2022-06-20 MED ORDER — WITCH HAZEL-GLYCERIN EX PADS: 1.0000 | MEDICATED_PAD | CUTANEOUS | Status: DC | PRN

## 2022-06-20 MED ORDER — LACTATED RINGERS IV SOLN: 500.0000 mL | INTRAVENOUS | Status: DC | PRN

## 2022-06-20 MED ORDER — OXYCODONE HCL 5 MG PO TABS: 5.0000 mg | ORAL_TABLET | ORAL | Status: DC | PRN

## 2022-06-20 MED ORDER — ONDANSETRON HCL 4 MG/2ML IJ SOLN: 4.0000 mg | INTRAMUSCULAR | Status: DC | PRN

## 2022-06-20 MED ORDER — FENTANYL CITRATE (PF) 100 MCG/2ML IJ SOLN
100.0000 ug | INTRAMUSCULAR | Status: DC | PRN
  Administered 2022-06-20: 100 ug via INTRAVENOUS
  Filled 2022-06-20: qty 2

## 2022-06-20 MED ORDER — BENZOCAINE-MENTHOL 20-0.5 % EX AERO
1.0000 | INHALATION_SPRAY | CUTANEOUS | Status: DC | PRN
  Filled 2022-06-20: qty 56

## 2022-06-20 MED ORDER — SODIUM CHLORIDE 0.9 % IV SOLN: INTRAVENOUS | Status: DC | PRN

## 2022-06-20 MED ORDER — OXYTOCIN-SODIUM CHLORIDE 30-0.9 UT/500ML-% IV SOLN: 2.5000 [IU]/h | INTRAVENOUS | Status: DC

## 2022-06-20 MED ORDER — COCONUT OIL OIL: 1.0000 | TOPICAL_OIL | Status: DC | PRN

## 2022-06-20 MED ORDER — SODIUM CHLORIDE 0.9% FLUSH: 3.0000 mL | Freq: Two times a day (BID) | INTRAVENOUS | Status: DC

## 2022-06-20 NOTE — H&P (Addendum)
OBSTETRIC ADMISSION HISTORY AND PHYSICAL  Cheryl Bright is a 37 y.o. female [phone] with IUP at [redacted]w[redacted]d by LMP presenting for pain and contractions occurring 10 minutes apart beginning 0700 this AM. She reports +FMs, No LOF, no VB, no blurry vision, headaches or peripheral edema, and RUQ pain.  She plans on breast feeding. She request BTL for birth control. She desires VBAC. Noted to have elevated blood pressures on admission. She received her prenatal care at [facility]   Dating: By LMP --->  Estimated Date of Delivery: 06/16/22  Sono:   @[redacted]w[redacted]d , CWD, normal anatomy, cephalic presentation, 211g, 5% EFW @[redacted]w[redacted]d , CWD, normal anatomy, cephalic presentation, 2719g, 6lb, 16% EFW   Prenatal History/Complications: FGR that resolved but was followed by MFM, AMA, sickle cell trait in mother, grand multiparity, MS during pregnancy (has not seen neuro since 2021), pregnancy-induced HTN (normal pressures this pregnancy)          Nursing Staff Provider  Office Location [facility] Dating  06/16/2022, by Last Menstrual Period  [facility] Model [x ] Traditional [ ]  Centering [ ]  Mom-Baby Dyad Anatomy US  Wnl, f/u normal  Language  English      Flu Vaccine  declined Genetic/Carrier Screen  NIPS: LR/Female AFP:   screen negative Horizon: carrier beta thalassemia/sickle trait  TDaP Vaccine   03/21/22 Hgb A1C or  GTT Early  Third trimester:  Glucose, Fasting 70 - 91 mg/dL 88  Glucose, 1 hour 70 - 179 mg/dL 771  Glucose, 2 hour 70 - 152 mg/dL 165    COVID Vaccine declined   LAB RESULTS   Rhogam    N/a Blood Type   O+  Baby Feeding Plan  breast Antibody neg  Contraception  BTL Rubella  Immune  Circumcision N/a RPR NR  Pediatrician  [facility] HBsAg Neg  Support Person Rocco Pauls HCVAb   neg  Prenatal Classes Info given HIV Neg  BTL Consent Done 03/02/2022 GBS  Neg (For PCN allergy, check sensitivities)   VBAC Consent Done [id number] Pap 11/24/2020 at [facility] Everywhere, neg           DME Rx [ ]  BP  cuff [ ]  Weight Scale Waterbirth  [ ]  Class [ ]  Consent [ ]  CNM visit  PHQ9 & GAD7 [x]  new OB [x]  28 weeks  [ x ] 36 weeks Induction  [ x] Orders Entered [ ] Foley Y/N       Past Medical History: Past Medical History:  Diagnosis Date   Depression    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, antepartum 03/11/2018   MS (multiple sclerosis)    PCOS (polycystic ovarian syndrome) 09/15/2012   Secondary oligomenorrhea and hirsutism> suspect anovulatory, PCOS> metformin started 7/14   Pregnancy induced hypertension    Sickle cell trait     Past Surgical History: Past Surgical History:  Procedure Laterality Date   CESAREAN SECTION     DILATION AND EVACUATION N/A 03/06/2018   Procedure: DILATATION AND EVACUATION;  Surgeon: Tereso Newcomer, MD;  Location: [facility];  Service: Gynecology;  Laterality: N/A;   FOOT SURGERY      Obstetrical History: OB History     Gravida  9   Para  5   Term  5   Preterm      AB  3   Living  5      SAB  2   IAB      Ectopic      Multiple  0  Live Births  5           Social History Social History   Socioeconomic History   Marital status: Single    Spouse name: Not on file   Number of children: Not on file   Years of education: Not on file   Highest education level: Not on file  Occupational History   Not on file  Tobacco Use   Smoking status: Every Day    Packs/day: 0.50    Years: 5.00    Additional pack years: 0.00    Total pack years: 2.50    Types: Cigarettes   Smokeless tobacco: Never  Vaping Use   Vaping Use: Never used  Substance and Sexual Activity   Alcohol use: Not Currently    Comment: Socially   Drug use: Not Currently    Types: Marijuana    Comment: last use a couple weeks ago(mid Mar 2024)   Sexual activity: Yes    Partners: Male    Birth control/protection: None  Other Topics Concern   Not on file  Social History Narrative   Not on file   Social Determinants of Health   Financial  Resource Strain: Medium Risk (12/05/2021)   Overall Financial Resource Strain (CARDIA)    Difficulty of Paying Living Expenses: Somewhat hard  Food Insecurity: No Food Insecurity (12/05/2021)   Hunger Vital Sign    Worried About Running Out of Food in the Last Year: Never true    Ran Out of Food in the Last Year: Never true  Transportation Needs: No Transportation Needs (12/05/2021)   PRAPARE - Administrator, Civil Service (Medical): No    Lack of Transportation (Non-Medical): No  Physical Activity: Sufficiently Active (12/05/2021)   Exercise Vital Sign    Days of Exercise per Week: 6 days    Minutes of Exercise per Session: 90 min  Stress: Stress Concern Present (12/05/2021)   [employer] of Occupational Health - Occupational Stress Questionnaire    Feeling of Stress : Very much  Social Connections: Unknown (12/05/2021)   Social Connection and Isolation Panel [NHANES]    Frequency of Communication with Friends and Family: More than three times a week    Frequency of Social Gatherings with Friends and Family: Twice a week    Attends Religious Services: Patient declined    Database administrator or Organizations: No    Attends Engineer, structural: Never    Marital Status: Living with partner    Family History: Family History  Problem Relation Age of Onset   Multiple sclerosis Sister    Asthma Son    Hypertension Maternal Grandmother    Heart disease Maternal Grandmother    Asthma Paternal Grandmother    Cancer Neg Hx    Diabetes Neg Hx    Stroke Neg Hx     Allergies: Allergies  Allergen Reactions   Sulfa Drugs Cross Reactors Hives and Swelling   Latex Rash    Medications Prior to Admission  Medication Sig Dispense Refill Last Dose   Prenatal Vit-Fe Fumarate-FA (PREPLUS) 27-1 MG TABS Take 1 tablet by mouth daily.   06/19/2022     Review of Systems   All systems reviewed and negative except as stated in HPI  Blood pressure (!) 155/94, pulse  86, temperature 98.6 F (37 C), temperature source Oral, resp. rate 18, height  (1.575 m), weight 12.3 kg, last menstrual period 09/09/2021, SpO2 96 %, unknown if currently breastfeeding. General appearance:  alert, cooperative, and appears stated age Lungs: clear to auscultation bilaterally Heart: regular rate and rhythm Abdomen: soft, non-tender; bowel sounds normal Pelvic: see below Extremities: Homans sign is negative, no sign of DVT Presentation: cephalic Fetal monitoring: 6-25 BMP, reactive accels, early decels Uterine activity: Frequency: Every 10 minutes Dilation: 10 Effacement (%): 100 Station: Plus 3 Exam by:: Ndule  Prenatal labs: ABO, Rh: O/Positive/-- (10/03 1429) Antibody: Negative (10/03 1429) Rubella: 2.54 (10/03 1429) RPR: Non Reactive (01/17 0909)  HBsAg: Negative (10/03 1429)  HIV: Non Reactive (01/17 0909)  GBS: Negative/-- (03/18 0350)  2 hr GTT: nl  Genetic screening: Beta thal/sickle cell trait carrier, otherwise nl  Anatomy US: Early FGR, resolved. Anatomy otherwise nl.   Prenatal Transfer Tool  Maternal Diabetes: No Genetic Screening: Normal Maternal Ultrasounds/Referrals: IUGR that resolved  Fetal Ultrasounds or other Referrals:  Referred to Materal Fetal Medicine  Maternal Substance Abuse:  No Significant Maternal Medications:  None Significant Maternal Lab Results:  Group B Strep negative Number of Prenatal Visits:greater than 3 verified prenatal visits Other Comments:  None  Results for orders placed or performed during the hospital encounter of 06/20/22 (from the past 24 hour(s))  CBC   Collection Time: 06/20/22 10:38 AM  Result Value Ref Range   WBC 9.6 4.0 - 10.5 K/uL   RBC 3.69 (L) 3.87 - 5.11 MIL/uL   Hemoglobin 11.8 (L) 12.0 - 15.0 g/dL   HCT 16.1 (L) 09.6 - 04.5 %   MCV 89.7 80.0 - 100.0 fL   MCH 32.0 26.0 - 34.0 pg   MCHC 35.6 30.0 - 36.0 g/dL   RDW 40.9 81.1 - 91.4 %   Platelets 316 150 - 400 K/uL   nRBC 0.0 0.0 - 0.2 %     Patient Active Problem List   Diagnosis Date Noted   Normal labor 06/20/2022   Post term pregnancy over 40 weeks 06/20/2022   VBAC, delivered, current hospitalization 06/20/2022   Gestational hypertension 06/20/2022   BMI 38.0-38.9,adult 06/14/2022   Fetal growth restriction antepartum 02/18/2022   Grand multiparity 01/04/2022   AMA (advanced maternal age) multigravida 35+, second trimester 01/04/2022   Sickle cell trait 01/04/2022   Supervision of high risk pregnancy, antepartum 12/07/2021   History of VBAC 12/05/2021   Multiple sclerosis during pregnancy 12/05/2021   History of pregnancy induced hypertension 10/23/2018    Assessment/Plan:  Cheryl Bright is a 37 y.o. [id number] at [redacted]w[redacted]d here for contractions and found to progressing in labor.   #Labor: Labor progressing. Plan for VBAC as tolerated.   #Pain: PRNs including tylenol, fentanyl. Escalate if needed.  #FWB: Cat 1 #ID:  GBS neg #MOF: breast #MOC: desires BTL  Rosario Adie, MS3 [facility]  06/20/22 12:02 PM    Fellow Attestation  I saw and evaluated the patient, performing the key elements of the service.I  personally performed or re-performed the history, physical exam, and medical decision making activities of this service and have verified that the service and findings are accurately documented in the resident's note. I developed the management plan that is described in the resident's note, and I agree with the content, with my edits above.   37 y.o. [id number] at [redacted]w[redacted]d admitted with spontaneous onset of labor. Elevated blood pressures noted consistently at admission. In pain from labor, but meets criteria for gHTN. Will keep close watch on these.  Alfredia Ferguson, MD,[facility] OB Fellow, Faculty Practice  06/20/2022 12:12 PM

## 2022-06-20 NOTE — Telephone Encounter (Signed)
Preadmission screen  

## 2022-06-20 NOTE — Discharge Summary (Signed)
Postpartum Discharge Summary      Patient Name: Cheryl Bright DOB: [birth year] MRN: [id number]  Date of admission: 06/20/2022 Delivery date:06/20/2022  Delivering provider: Alfredia Ferguson  Date of discharge: 06/22/2022  Admitting diagnosis: Normal labor [O80, Z37.9] Post term pregnancy over 40 weeks [O48.0] Intrauterine pregnancy: [redacted]w[redacted]d     Secondary diagnosis:  Principal Problem:   VBAC, delivered, current hospitalization Active Problems:   History of pregnancy induced hypertension   History of VBAC   Multiple sclerosis during pregnancy   Supervision of high risk pregnancy, antepartum   Grand multiparity   AMA (advanced maternal age) multigravida 35+, second trimester   Fetal growth restriction antepartum   Normal labor   Post term pregnancy over [redacted] weeks   Gestational hypertension  Additional problems: acute blood loss anemia   Discharge diagnosis: Term Pregnancy Delivered                                              Post partum procedures: none Augmentation:  none Complications: None  Hospital course: Onset of Labor With Vaginal Delivery      37 y.o. yo [name] at [redacted]w[redacted]d was admitted in Active Labor on 06/20/2022. Labor course was complicated by: elevated blood pressures noted during labor and which continued after delivery, with the diagnosis of gestational hypertension Membrane Rupture Time/Date: 10:47 AM ,06/20/2022   Delivery Method:Vaginal, Spontaneous  Episiotomy: None  Lacerations:  None  Patient had a postpartum course complicated by nothing.  BPs well controlled on lasix and procardia, will complete a 5 day course of lasix and continue on procardia. Will f/u within one week for BP check. She is ambulating, tolerating a regular diet, passing flatus, and urinating well. Patient is discharged home in stable condition on 06/22/22.  Newborn Data: Birth date:06/20/2022  Birth time:10:47 AM  Gender:Female  Living status:Living  Apgars:8 ,9  [phone] g    Magnesium Sulfate received: No BMZ received: No Rhophylac:N/A MMR:N/A, Immune T-DaP:Given prenatally Transfusion:No  Physical exam  Vitals:   06/20/22 1725 06/20/22 2250 06/21/22 0222 06/21/22 1005  BP: 137/82 117/82 120/76 120/77  Pulse: 82 87 98   Resp: Temp:  (!) 97.5 F (36.4 C) 98.2 F (36.8 C)   TempSrc:  Oral Oral   SpO2:   100%   Weight:      Height:       General: alert, cooperative, and no distress Lochia: appropriate Uterine Fundus: firm Incision: Dressing is clean, dry, and intact DVT Evaluation: No cords or calf tenderness. Labs: Lab Results  Component Value Date   WBC 9.6 06/20/2022   HGB 11.8 (L) 06/20/2022   HCT 33.1 (L) 06/20/2022   MCV 89.7 06/20/2022   PLT 316 06/20/2022      Latest Ref Rng & Units 06/20/2022   10:38 AM  CMP  Glucose 70 - 99 mg/dL 91   BUN 6 - 20 mg/dL 8   Creatinine 3.08 - 6.57 mg/dL 8.46   Sodium 962 - 952 mmol/L 135   Potassium 3.5 - 5.1 mmol/L 4.5   Chloride 98 - 111 mmol/L 107   CO2 22 - 32 mmol/L 16   Calcium 8.9 - 10.3 mg/dL 9.0   Total Protein 6.5 - 8.1 g/dL 6.7   Total Bilirubin 0.3 - 1.2 mg/dL 0.1   Alkaline Phos 38 - 126 U/L  108   AST 15 - 41 U/L 19   ALT 0 - 44 U/L 31    Edinburgh Score:    06/21/2022    8:35 AM  Edinburgh Postnatal Depression Scale Screening Tool  I have been able to laugh and see the funny side of things. 0  I have looked forward with enjoyment to things. 0  I have blamed myself unnecessarily when things went wrong. 1  I have been anxious or worried for no good reason. 0  I have felt scared or panicky for no good reason. 0  Things have been getting on top of me. 0  I have been so unhappy that I have had difficulty sleeping. 0  I have felt sad or miserable. 0  I have been so unhappy that I have been crying. 0  The thought of harming myself has occurred to me. 0  Edinburgh Postnatal Depression Scale Total 1     After visit meds:  Allergies as of 06/21/2022        Reactions   Sulfa Drugs Cross Reactors Hives, Swelling   Latex Rash        Medication List     TAKE these medications    acetaminophen 325 MG tablet Commonly known as: Tylenol Take 2 tablets (650 mg total) by mouth every 4 (four) hours as needed (for pain scale < 4).   furosemide 20 MG tablet Commonly known as: LASIX Take 1 tablet (20 mg total) by mouth daily.   ibuprofen 600 MG tablet Commonly known as: ADVIL Take 1 tablet (600 mg total) by mouth every 6 (six) hours.   NIFEdipine 30 MG 24 hr tablet Commonly known as: ADALAT CC Take 1 tablet (30 mg total) by mouth daily.   PrePLUS 27-1 MG Tabs Take 1 tablet by mouth daily.         Discharge home in stable condition Infant Feeding: breast Infant Disposition:home with mother Discharge instruction: per After Visit Summary and Postpartum booklet. Activity: Advance as tolerated. Pelvic rest for 6 weeks.  Diet: routine diet Future Appointments: Future Appointments  Date Time Provider Department Center  06/28/2022  1:30 PM [facility] NURSE [facility]  07/31/2022  2:15 PM Leftwich-Kirby, Wilmer Floor, CNM [facility]   Follow up Visit:  Follow-up Information     Your OB provider Follow up.   Why: within 1 week for check of blood pressure and to check healing of your incision                 Please schedule this patient for a In person postpartum visit in 6 weeks with the following provider: Any provider. Additional Postpartum F/U:BP check 1 week bp check. Message sent to [facility]. High risk pregnancy complicated by: HTN, history of FGR, grandmultiparity Delivery mode:  Vaginal, Spontaneous  Anticipated Birth Control:  undecided (had planned on postpartum tubal sterilization but patient canceled the procedure)   06/22/2022 Silvano Bilis, MD

## 2022-06-20 NOTE — MAU Note (Signed)
Cheryl Bright is a 37 y.o. at [redacted]w[redacted]d here in MAU reporting: having ctxs that are 10 minutes apart, states ctxs began @ 0700 this morning.  Denies VB or LOF.  Endorses +FM. LMP: NA Onset of complaint: today Pain score: 8 Vitals:   06/20/22 1004  BP: (!) 158/104  Pulse: 91  Resp: 18  Temp: 98.6 F (37 C)  SpO2: 96%     [id number] Lab orders placed from triage:   UA

## 2022-06-20 NOTE — Lactation Note (Signed)
This note was copied from a baby's chart. Lactation Consultation Note  Patient Name: Cheryl Bright [id number] Date: 06/20/2022 Age:36 hours Reason for consult: Initial assessment;Term;Infant < 6lbs;Maternal endocrine disorder  P6, Mother plans to breastfeed and formula feed.   Baby recently received 15 ml formula in L&D so LC did not assist mother with latching.  Encouraged latching before offering formula to help establish mother's milk supply.  Reviewed supplementation guidelines. Set up DEBP and suggest pumping q 3 hours if baby is receiving formula.  Feed on demand with cues.  Goal 8-12+ times per day after first 24 hrs.  Place baby STS if not cueing.  Mom made aware of O/P services, breastfeeding support group and our phone # for post-discharge questions.  Suggest mother call [facility] for loaner pump or her insurance.  Mother did not qualify for Stork pump.      Maternal Data Has patient been taught Hand Expression?: Yes Does the patient have breastfeeding experience prior to this delivery?: Yes How long did the patient breastfeed?: 6 mos.  Feeding Mother's Current Feeding Choice: Breast Milk and Formula Nipple Type: Slow - flow Lactation Tools Discussed/Used Tools: Pump Breast pump type: Double-Electric Breast Pump;Manual Pump Education: Setup, frequency, and cleaning;Milk Storage Reason for Pumping: stimulation and supplementation Pumping frequency: q 3 hours  Interventions Interventions: Education;Breast feeding basics reviewed;LC Services brochure  Discharge Pump: Advised to call insurance company (and call [facility])  Consult Status Consult Status: Follow-up Date: 06/21/22 Follow-up type: In-patient    Dahlia Byes [facility] 06/20/2022, 2:50 PM

## 2022-06-21 ENCOUNTER — Inpatient Hospital Stay (HOSPITAL_COMMUNITY): Payer: Medicaid Other

## 2022-06-21 ENCOUNTER — Encounter (HOSPITAL_COMMUNITY)
Admission: AD | Disposition: A | Discharge status: Home / Self Care | Payer: Self-pay | Source: Home / Self Care | Attending: Family Medicine

## 2022-06-21 ENCOUNTER — Other Ambulatory Visit (HOSPITAL_COMMUNITY): Payer: Self-pay

## 2022-06-21 ENCOUNTER — Ambulatory Visit (HOSPITAL_BASED_OUTPATIENT_CLINIC_OR_DEPARTMENT_OTHER): Payer: Medicaid Other

## 2022-06-21 ENCOUNTER — Inpatient Hospital Stay (HOSPITAL_COMMUNITY): Payer: Medicaid Other | Admitting: Anesthesiology

## 2022-06-21 ENCOUNTER — Inpatient Hospital Stay (HOSPITAL_COMMUNITY): Admission: RE | Admit: 2022-06-21 | Payer: Medicaid Other | Source: Home / Self Care | Admitting: Family Medicine

## 2022-06-21 LAB — RPR: RPR Ser Ql: NONREACTIVE

## 2022-06-21 SURGERY — LIGATION, FALLOPIAN TUBE, POSTPARTUM
Anesthesia: General

## 2022-06-21 MED ORDER — FUROSEMIDE 20 MG PO TABS
20.0000 mg | ORAL_TABLET | Freq: Every day | ORAL | 0 refills | Status: DC
  Filled 2022-06-21: qty 3, 3d supply, fill #0

## 2022-06-21 MED ORDER — IBUPROFEN 600 MG PO TABS
600.0000 mg | ORAL_TABLET | Freq: Four times a day (QID) | ORAL | 0 refills | Status: DC
  Filled 2022-06-21: qty 30, 8d supply, fill #0

## 2022-06-21 MED ORDER — LACTATED RINGERS IV SOLN: INTRAVENOUS | Status: DC

## 2022-06-21 MED ORDER — NIFEDIPINE ER 30 MG PO TB24
30.0000 mg | ORAL_TABLET | Freq: Every day | ORAL | 1 refills | Status: DC
  Filled 2022-06-21: qty 30, 30d supply, fill #0

## 2022-06-21 MED ORDER — FAMOTIDINE 20 MG PO TABS: 40.0000 mg | ORAL_TABLET | Freq: Once | ORAL | Status: DC

## 2022-06-21 MED ORDER — ACETAMINOPHEN 325 MG PO TABS
650.0000 mg | ORAL_TABLET | ORAL | 1 refills | Status: DC | PRN
  Filled 2022-06-21: qty 30, 3d supply, fill #0

## 2022-06-21 MED ORDER — METOCLOPRAMIDE HCL 10 MG PO TABS: 10.0000 mg | ORAL_TABLET | Freq: Once | ORAL | Status: DC

## 2022-06-21 NOTE — Lactation Note (Deleted)
This note was copied from a baby's chart. Lactation Consultation Note  Patient Name: Cheryl Bright [id number]'[id number] Date: 06/21/2022 Age:36 hours Reason for consult: Maternal endocrine disorder;Follow-up assessment;Infant weight loss (7 % weight loss, per mom for now plan to obtain a DEBP from [facility] in [place], [place] and pump. LC recommended to ask [facility] for LC O/P , or call back [facility] LC O/P for appt if latching . LC reviewed BF D/C teaching and the [facility] resources.)   Maternal Data    Feeding Mother's Current Feeding Choice: Breast Milk and Formula  n Tools Discussed/Used  DEBP   Interventions Interventions: Breast feeding basics reviewed;Assisted with latch;Education;LC Services brochure;DEBP  Discharge Discharge Education: Engorgement and breast care;Warning signs for feeding baby;Outpatient recommendation;Other (comment) (mom plasn to check at the [facility] office)  Consult Status Consult Status: Complete Date: 06/21/22    Kathrin Greathouse 06/21/2022, 3:00 PM

## 2022-06-21 NOTE — Anesthesia Preprocedure Evaluation (Deleted)
Anesthesia Evaluation  Patient identified by MRN, date of birth, ID band Patient awake    Reviewed: Allergy & Precautions, NPO status , Patient's Chart, lab work & pertinent test results  History of Anesthesia Complications Negative for: history of anesthetic complications  Airway Mallampati: III  TM Distance: >3 FB Neck ROM: Full    Dental  (+) Dental Advisory Given   Pulmonary neg shortness of breath, neg sleep apnea, neg COPD, neg recent URI, Current Smoker   Pulmonary exam normal breath sounds clear to auscultation       Cardiovascular hypertension (gestational), Pt. on medications (-) angina (-) Past MI, (-) Cardiac Stents and (-) CABG (-) dysrhythmias  Rhythm:Regular Rate:Normal     Neuro/Psych neg Seizures PSYCHIATRIC DISORDERS  Depression     Neuromuscular disease (Multiple sclerosis)    GI/Hepatic negative GI ROS, Neg liver ROS,,,  Endo/Other  negative endocrine ROS    Renal/GU negative Renal ROS     Musculoskeletal   Abdominal  (+) + obese  Peds  Hematology  (+) Blood dyscrasia, Sickle cell trait   Anesthesia Other Findings 37 y.o. [id number] s/p VBAC at [redacted]w[redacted]d.  Reproductive/Obstetrics                             Anesthesia Physical Anesthesia Plan  ASA: 3  Anesthesia Plan: General   Post-op Pain Management:    Induction: Intravenous and Rapid sequence  PONV Risk Score and Plan: 2 and Ondansetron, Dexamethasone and Treatment may vary due to age or medical condition  Airway Management Planned: Oral ETT  Additional Equipment:   Intra-op Plan:   Post-operative Plan: Extubation in OR  Informed Consent: I have reviewed the patients History and Physical, chart, labs and discussed the procedure including the risks, benefits and alternatives for the proposed anesthesia with the patient or authorized representative who has indicated his/her understanding and acceptance.      Dental advisory given  Plan Discussed with: CRNA and Anesthesiologist  Anesthesia Plan Comments: (Risks of general anesthesia discussed including, but not limited to, sore throat, hoarse voice, chipped/damaged teeth, injury to vocal cords, nausea and vomiting, allergic reactions, lung infection, heart attack, stroke, and death. All questions answered. )       Anesthesia Quick Evaluation

## 2022-06-21 NOTE — Progress Notes (Signed)
POSTPARTUM PROGRESS NOTE  Post Partum Day #1  Subjective:  Cheryl Bright is a 37 y.o. J1P9150 s/p VBAC at [redacted]w[redacted]d.  No acute events overnight.  Pt denies problems with ambulating, voiding or po intake.  She denies nausea or vomiting.  Pain is moderately controlled.  She has had flatus. She has not had bowel movement.  Patient reports minimal vaginal bleeding.  Objective: Blood pressure 120/76, pulse 98, temperature 98.2 F (36.8 C), temperature source Oral, resp. rate 16, height 5\' 2"  (1.575 m), weight 98.4 kg, last menstrual period 09/09/2021, SpO2 100 %, unknown if currently breastfeeding.  Physical Exam:  General: alert, cooperative and no distress Chest: no respiratory distress Heart:regular rate, distal pulses intact Abdomen: soft, nontender,  Uterine Fundus: firm, appropriately tender DVT Evaluation: No calf swelling or tenderness Extremities: No edema Skin: warm, dry  Recent Labs    06/20/22 1038  HGB 11.8*  HCT 33.1*   Assessment/Plan: Cheryl Bright is a 37 y.o. V6P7948 s/p VBAC at [redacted]w[redacted]d.  PPD#1 - Doing well. Routine postpartum care  New gHTN: On lasix 20 mg and procardia 30 mg. BP stable overnight. Continue to monitor BP  Contraception: Scheduled for BTL 4/18 at 1200 Feeding: Breast Dispo: Plan for discharge today or tomorrow   LOS: 1 day   Bubba Hales, Medical Student 06/21/2022, 5:32 AM

## 2022-06-23 ENCOUNTER — Inpatient Hospital Stay (HOSPITAL_COMMUNITY): Payer: Medicaid Other

## 2022-06-28 ENCOUNTER — Ambulatory Visit (INDEPENDENT_AMBULATORY_CARE_PROVIDER_SITE_OTHER): Payer: Medicaid Other | Admitting: Obstetrics & Gynecology

## 2022-06-28 ENCOUNTER — Encounter (HOSPITAL_BASED_OUTPATIENT_CLINIC_OR_DEPARTMENT_OTHER): Payer: Self-pay

## 2022-06-28 VITALS — BP 144/98 | HR 81 | Ht 62.0 in | Wt 208.8 lb

## 2022-06-28 DIAGNOSIS — F17200 Nicotine dependence, unspecified, uncomplicated: Secondary | ICD-10-CM | POA: Diagnosis not present

## 2022-06-28 DIAGNOSIS — Z8759 Personal history of other complications of pregnancy, childbirth and the puerperium: Secondary | ICD-10-CM

## 2022-07-01 ENCOUNTER — Encounter (HOSPITAL_BASED_OUTPATIENT_CLINIC_OR_DEPARTMENT_OTHER): Payer: Self-pay | Admitting: Obstetrics & Gynecology

## 2022-07-01 NOTE — Progress Notes (Signed)
GYNECOLOGY  VISIT  CC:   post partum BP check  HPI: 37 y.o. [name] [name] here for blood pressure check.  She was originally on nursing schedule but due to blood pressure, was added to provider schedule.  Pt did not have elevated blood pressures until L&D with delivery.  These stayed elevated post partum and was diagnosed with gestational hypertension.  She was started on lasix and procardia and had controlled blood pressures.  Since discharge, she has not taken lasix or procardia.  Blood pressure discussed today.  Denies headache or visual changes.  She is now smoking.  Partner accompanies her today and now understands importance of taking medication and he reports he will help her take it.     Past Medical History:  Diagnosis Date   Depression    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, antepartum 03/11/2018   MS (multiple sclerosis) (HCC)    PCOS (polycystic ovarian syndrome) 09/15/2012   Secondary oligomenorrhea and hirsutism> suspect anovulatory, PCOS> metformin started 7/14   Pregnancy induced hypertension    Sickle cell trait (HCC)     MEDS:   Current Outpatient Medications on File Prior to Visit  Medication Sig Dispense Refill   acetaminophen (TYLENOL) 325 MG tablet Take 2 tablets (650 mg total) by mouth every 4 (four) hours as needed (for pain scale < 4). 30 tablet 1   furosemide (LASIX) 20 MG tablet Take 1 tablet (20 mg total) by mouth daily. 3 tablet 0   ibuprofen (ADVIL) 600 MG tablet Take 1 tablet (600 mg total) by mouth every 6 (six) hours. 30 tablet 0   NIFEdipine (ADALAT CC) 30 MG 24 hr tablet Take 1 tablet (30 mg total) by mouth daily. 30 tablet 1   Prenatal Vit-Fe Fumarate-FA (PREPLUS) 27-1 MG TABS Take 1 tablet by mouth daily.     No current facility-administered medications on file prior to visit.    ALLERGIES: Sulfa drugs cross reactors and Latex  SH:  partnered, smoking  Review of Systems  Constitutional: Negative.    Genitourinary: Negative.     PHYSICAL EXAMINATION:    BP (!) 147/99 (BP Location: Left Arm, Patient Position: Sitting, Cuff Size: Large)   Pulse 81   Wt 208 lb 12.8 oz (94.7 kg)   LMP 09/09/2021 (Exact Date)   BMI 38.19 kg/m     General appearance: alert, cooperative and appears stated age CV:  Regular rate and rhythm Lungs:  clear to auscultation, no wheezes, rales or rhonchi, symmetric air entry Ext:  no edema, no hyperreflexia  Assessment/Plan: 1. History of gestational hypertension - highly encouraged taking procardia XL 30mg  daily - as she has no edema at this point and more than 1 week since delivery, do not feel taking lasix is needed at this time - recheck BP 7 -10 days - highly encouraged checking at home as well  2. Smoking

## 2022-07-10 ENCOUNTER — Ambulatory Visit (INDEPENDENT_AMBULATORY_CARE_PROVIDER_SITE_OTHER): Payer: Medicaid Other | Admitting: *Deleted

## 2022-07-10 VITALS — BP 150/99 | HR 61 | Ht 62.0 in | Wt 201.0 lb

## 2022-07-10 DIAGNOSIS — Z0130 Encounter for examination of blood pressure without abnormal findings: Secondary | ICD-10-CM

## 2022-07-10 MED ORDER — NIFEDIPINE ER OSMOTIC RELEASE 60 MG PO TB24: 60.0000 mg | ORAL_TABLET | Freq: Every day | ORAL | 1 refills | Status: DC

## 2022-07-10 MED ORDER — METOCLOPRAMIDE HCL 10 MG PO TABS: 10.0000 mg | ORAL_TABLET | Freq: Three times a day (TID) | ORAL | 0 refills | Status: DC

## 2022-07-11 NOTE — Progress Notes (Signed)
Pt presents to office for blood pressure check. BP elevated at 150/99. Pt has just started back taking her nifedipine about a week ago. Consulted with Dr. Hyacinth Meeker who recommends that the pt increase her dosage to 60mg  daily. New rx sent to pharmacy. Pt also request medication to  help with milk supply. Rx sent to pharmacy for reglan, per provider recommendation. Pt requests to return to work. Advised that is not recommended at this time as she is only 2 weeks postpartum and her blood pressures are not well controlled. Pt to return in 1 week for follow up blood pressure check.

## 2022-07-17 ENCOUNTER — Ambulatory Visit (HOSPITAL_BASED_OUTPATIENT_CLINIC_OR_DEPARTMENT_OTHER): Payer: Medicaid Other

## 2022-07-25 ENCOUNTER — Ambulatory Visit (INDEPENDENT_AMBULATORY_CARE_PROVIDER_SITE_OTHER): Payer: Medicaid Other | Admitting: Obstetrics & Gynecology

## 2022-07-25 ENCOUNTER — Ambulatory Visit (INDEPENDENT_AMBULATORY_CARE_PROVIDER_SITE_OTHER): Payer: Medicaid Other

## 2022-07-25 ENCOUNTER — Encounter (HOSPITAL_BASED_OUTPATIENT_CLINIC_OR_DEPARTMENT_OTHER): Payer: Self-pay | Admitting: Obstetrics & Gynecology

## 2022-07-25 VITALS — BP 150/109 | HR 75 | Ht 62.0 in | Wt 201.2 lb

## 2022-07-25 DIAGNOSIS — Z8759 Personal history of other complications of pregnancy, childbirth and the puerperium: Secondary | ICD-10-CM

## 2022-07-25 DIAGNOSIS — R071 Chest pain on breathing: Secondary | ICD-10-CM

## 2022-07-25 DIAGNOSIS — O165 Unspecified maternal hypertension, complicating the puerperium: Secondary | ICD-10-CM | POA: Diagnosis not present

## 2022-07-25 DIAGNOSIS — Z3A00 Weeks of gestation of pregnancy not specified: Secondary | ICD-10-CM

## 2022-07-25 DIAGNOSIS — F17200 Nicotine dependence, unspecified, uncomplicated: Secondary | ICD-10-CM | POA: Diagnosis not present

## 2022-07-25 MED ORDER — LABETALOL HCL 100 MG PO TABS
100.0000 mg | ORAL_TABLET | Freq: Two times a day (BID) | ORAL | 0 refills | Status: DC
Start: 2022-07-25 — End: 2022-07-25

## 2022-07-26 NOTE — Progress Notes (Signed)
GYNECOLOGY  VISIT  CC:   BP check  HPI: 37 y.o. [id number] [name] here for BP check.  She reports she started having pain with deep breaths specifically on the right side and down low several days ago.  She felt this was related to her blood pressure medication so stopped it.  The pain with deep breath has not resolved.  It is also present with cough or laugh.  Denies SOB or palpitations.  Otherwise feels well.    Diastolic blood pressure is quite elevated today.  Strongly feel she needs to be on anti-hypertensive.  Discussed options with her.  As stopping the medication has not helped the pain with deep inspiration, feel she needs to restart it.  We did discuss other options but I feel she will benefit from daily dosing due to new born baby and busy family.  She is breast feeding as well.  No recent trauma or significant cough.   She does have a dull headache today and has had this for several days.  Hasn't taken anything for this.   Past Medical History:  Diagnosis Date   Depression    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, antepartum 03/11/2018   MS (multiple sclerosis) (HCC)    PCOS (polycystic ovarian syndrome) 09/15/2012   Secondary oligomenorrhea and hirsutism> suspect anovulatory, PCOS> metformin started 7/14   Pregnancy induced hypertension    Sickle cell trait (HCC)     MEDS:   Current Outpatient Medications on File Prior to Visit  Medication Sig Dispense Refill   Prenatal Vit-Fe Fumarate-FA (PREPLUS) 27-1 MG TABS Take 1 tablet by mouth daily.     acetaminophen (TYLENOL) 325 MG tablet Take 2 tablets (650 mg total) by mouth every 4 (four) hours as needed (for pain scale < 4). (Patient not taking: Reported on 07/25/2022) 30 tablet 1   ibuprofen (ADVIL) 600 MG tablet Take 1 tablet (600 mg total) by mouth every 6 (six) hours. (Patient not taking: Reported on 07/25/2022) 30 tablet 0   metoCLOPramide (REGLAN) 10 MG tablet Take 1 tablet (10 mg  total) by mouth 3 (three) times daily before meals. (Patient not taking: Reported on 07/25/2022) 90 tablet 0   NIFEdipine (PROCARDIA XL/NIFEDICAL XL) 60 MG 24 hr tablet Take 1 tablet (60 mg total) by mouth daily. (Patient not taking: Reported on 07/25/2022) 30 tablet 1   [DISCONTINUED] NIFEdipine (ADALAT CC) 30 MG 24 hr tablet Take 1 tablet (30 mg total) by mouth daily. 30 tablet 1   No current facility-administered medications on file prior to visit.    ALLERGIES: Sulfa drugs cross reactors and Latex  SH:  partnered, smoking about 1/2 PPD  Review of Systems  Respiratory: Negative.    Neurological:  Positive for headaches (dull).    PHYSICAL EXAMINATION:    BP (!) 150/109   Pulse 75   Ht 5\' 2"  (1.575 m)   Wt 201 lb 3.2 oz (91.3 kg)   LMP 09/09/2021 (Exact Date)   Breastfeeding Yes   BMI 36.80 kg/m     General appearance: alert, cooperative and appears stated age Lungs:  clear to auscultation, no wheezes, rales or rhonchi, symmetric air entry Breasts: normal appearance, no masses or tenderness Abdomen: palpation along costal margin on the lower right side of ribs reproduces pain  Assessment/Plan: 1. Chest pain varying with breathing - palpation does seem to reproduce pain so currently feel this is most likely musculoskeletal - DG Chest 2 View; Future - tylenol/motrin  recommended for pain  2. History of gestational hypertension  3. Postpartum hypertension - highly recommended restarting procardia XL 60mg  daily and recheck blood pressures.   - does not need RF at this time - she will return for post partum appt next week  4.  Smoking

## 2022-07-28 MED ORDER — FUROSEMIDE 20 MG PO TABS: 20.0000 mg | ORAL_TABLET | Freq: Every day | ORAL | 0 refills | Status: AC

## 2022-07-31 ENCOUNTER — Ambulatory Visit (INDEPENDENT_AMBULATORY_CARE_PROVIDER_SITE_OTHER): Payer: Medicaid Other | Admitting: Advanced Practice Midwife

## 2022-07-31 ENCOUNTER — Encounter (HOSPITAL_BASED_OUTPATIENT_CLINIC_OR_DEPARTMENT_OTHER): Payer: Self-pay | Admitting: Advanced Practice Midwife

## 2022-07-31 DIAGNOSIS — Z30430 Encounter for insertion of intrauterine contraceptive device: Secondary | ICD-10-CM

## 2022-07-31 DIAGNOSIS — Z3009 Encounter for other general counseling and advice on contraception: Secondary | ICD-10-CM

## 2022-07-31 DIAGNOSIS — Z9189 Other specified personal risk factors, not elsewhere classified: Secondary | ICD-10-CM | POA: Diagnosis not present

## 2022-07-31 DIAGNOSIS — N393 Stress incontinence (female) (male): Secondary | ICD-10-CM

## 2022-07-31 DIAGNOSIS — O165 Unspecified maternal hypertension, complicating the puerperium: Secondary | ICD-10-CM

## 2022-07-31 DIAGNOSIS — R071 Chest pain on breathing: Secondary | ICD-10-CM | POA: Diagnosis not present

## 2022-07-31 MED ORDER — LEVONORGESTREL 20 MCG/DAY IU IUD
1.0000 | INTRAUTERINE_SYSTEM | Freq: Once | INTRAUTERINE | Status: AC
Start: 2022-07-31 — End: 2022-07-31
  Administered 2022-07-31: 1 via INTRAUTERINE

## 2022-07-31 NOTE — Progress Notes (Signed)
Post Partum Visit Note  Cheryl Bright is a 37 y.o. [phone] female who presents for a postpartum visit. She is 6 weeks postpartum following a normal spontaneous vaginal delivery.  I have fully reviewed the prenatal and intrapartum course. The delivery was at 40 gestational weeks.  Anesthesia: none. Postpartum course has been normal. Baby is doing well. Baby is feeding by breast. Bleeding no bleeding. Bowel function is normal. Bladder function is normal. Patient is not sexually active. Contraception method is none. Postpartum depression screening: negative.   The pregnancy intention screening data noted above was reviewed. Potential methods of contraception were discussed. The patient elected to proceed with No data recorded.    Health Maintenance Due  Topic Date Due   COVID-19 Vaccine (1) Never done    The following portions of the patient's history were reviewed and updated as appropriate: allergies, current medications, past family history, past medical history, past social history, past surgical history, and problem list.  Review of Systems Pertinent items noted in HPI and remainder of comprehensive ROS otherwise negative.  Objective:  BP (!) 146/107 (BP Location: Left Arm, Patient Position: Sitting, Cuff Size: Large)   Pulse 80   Ht 5\' 2"  (1.575 m) Comment: Reported  Wt 201 lb 6.4 oz (91.4 kg)   LMP 09/09/2021 (Exact Date)   BMI 36.84 kg/m    VS reviewed, nursing note reviewed,  Constitutional: well developed, well nourished, no distress HEENT: normocephalic CV: normal rate Pulm/chest wall: normal effort Abdomen: soft Neuro: alert and oriented x 3 Skin: warm, dry Psych: affect normal    IUD Procedure Note Patient identified, informed consent performed.  Discussed risks of irregular bleeding, cramping, infection, malpositioning or misplacement of the IUD outside the uterus which may require further procedures. Time out was performed.    Speculum placed in the vagina.   Cervix visualized.  Cleaned with Betadine x 2.  Grasped anteriorly with a single tooth tenaculum.  Uterus sounded to 7 cm.  Mirena IUD placed per manufacturer's recommendations.  Strings trimmed to 3 cm. Tenaculum was removed, good hemostasis noted.  Patient tolerated procedure well.   Patient was given post-procedure instructions and the Mirena care card with expiration date.  Patient was also asked to check IUD strings periodically and follow up in 4-6 weeks for IUD check.   Assessment:    1. Postpartum care following vaginal delivery --Doing fair per pt, not as well as she has done after previous deliveries.  No physical symptoms, earlier chest pain resolved.    2. Chest pain varying with breathing --RUQ/chest pain now resolved after Lasix course.    3. Postpartum hypertension --Pt stopped taking nifedipine while she was taking Lasix course, completed today.   --Resume nifedipine 30 mg XL daily, BP check in 1 week  4. At risk for postpartum depression --Pt reports less support at home with this pregnancy and is not doing as well postpartum as she has done before.  - Ambulatory referral to Integrated Behavioral Health  5. Encounter for counseling regarding contraception --Discussed pt contraceptive plans and reviewed contraceptive methods based on pt preferences and effectiveness.  Pt prefers IUD today. --IUD placed, see procedure note.  6. Stress incontinence, female --Stress incontinence, mild, since previous pregnancies - AMB referral to rehabilitation    Plan:   Essential components of care per ACOG recommendations:  1.  Mood and well being: Patient with negative depression screening today. Reviewed local resources for support.  - Patient tobacco use? Yes. Patient  desires to quit? No.   - hx of drug use? No.    2. Infant care and feeding:  -Patient currently breastmilk feeding? Yes. Reviewed importance of draining breast regularly to support lactation.  -Social  determinants of health (SDOH) reviewed in EPIC. No concerns    3. Sexuality, contraception and birth spacing - Patient does not want a pregnancy in the next year.   - Reviewed reproductive life planning. Reviewed contraceptive methods based on pt preferences and effectiveness.  Patient desired IUD or IUS today.     4. Sleep and fatigue -Encouraged family/partner/community support of 4 hrs of uninterrupted sleep to help with mood and fatigue  5. Physical Recovery  - Discussed patients delivery and complications. She describes her labor as good. - Patient had a Vaginal, no problems at delivery. Patient had  no  laceration. Perineal healing reviewed. Patient expressed understanding - Patient has urinary incontinence? Yes. Discussed role of pelvic floor PT. Offered PT and patient accepted. Patient was referred to pelvic floor PT.  - Patient is safe to resume physical and sexual activity  6.  Health Maintenance - HM due items addressed Yes - Last pap smear 11/24/20 at [facility]. Pap smear not done at today's visit.  -Breast Cancer screening indicated? No.   7. Chronic Disease/Pregnancy Condition follow up: Hypertension --Restart nifedipine 30 mg XL daily, BP check in 1 week - PCP follow up  Sharen Counter, CNM [facility] [employer]

## 2022-08-01 ENCOUNTER — Telehealth: Payer: Self-pay | Admitting: Clinical

## 2022-08-01 NOTE — Telephone Encounter (Signed)
Attempt call regarding referral; Left HIPPA-compliant message to call back Cheryl Bright from [facility]  [facility] at  [phone] ([place]'s office).   

## 2022-08-03 NOTE — BH Specialist Note (Signed)
Pt requests to reschedule due to work schedule; rescheduled to 09/04/22 at 1:45pm.

## 2022-08-17 ENCOUNTER — Ambulatory Visit: Payer: Medicaid Other | Admitting: Clinical

## 2022-08-17 DIAGNOSIS — Z91199 Patient's noncompliance with other medical treatment and regimen due to unspecified reason: Secondary | ICD-10-CM

## 2022-08-28 NOTE — BH Specialist Note (Signed)
Integrated Behavioral Health via Telemedicine Visit  09/04/2022 JOLAN SUNDT [id number]  Number of Integrated Behavioral Health Clinician visits: 1- Initial Visit  Session Start time: 1352   Session End time: 1443  Total time in minutes: 51   Referring Provider: Sharen Counter, CNM Patient/Family location: Home [facility] [place] Provider location: [facility]  All persons participating in visit: Patient Cheryl Bright and [facility] Avanelle Pixley   Types of Service: Individual psychotherapy and Video visit  I connected with Cheryl Bright and/or Cheryl Bright's  n/a  via  Telephone or Engineer, civil (consulting)  (Video is Caregility application) and verified that I am speaking with the correct person using two identifiers. Discussed confidentiality: Yes   I discussed the limitations of telemedicine and the availability of in person appointments.  Discussed there is a possibility of technology failure and discussed alternative modes of communication if that failure occurs.  I discussed that engaging in this telemedicine visit, they consent to the provision of behavioral healthcare and the services will be billed under their insurance.  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  Duration of problem: Postpartum; Severity of problem: moderate  Patient and/or Family's Strengths/Protective Factors: Social connections, Concrete supports in place (healthy food, safe  environments, etc.), Sense of purpose, and Physical Health (exercise, healthy diet, medication compliance, etc.)  Goals Addressed: Patient will:  Reduce symptoms of: anxiety and stress   Increase knowledge and/or ability of: self-management skills and stress reduction   Demonstrate ability to: Increase healthy adjustment to current life circumstances, Increase adequate support systems for patient/family, Increase motivation to adhere to plan of care, and Continue healthy grieving over loss  Progress towards Goals: Ongoing  Interventions: Interventions utilized:  Mindfulness or Management consultant, Psychoeducation and/or Health Education, and Link to [employer] Standardized Assessments completed: Not Needed  Patient and/or Family Response: Patient agrees with treatment plan.   Assessment: Patient currently experiencing Grief; Psychosocial stress.   Patient may benefit from continued therapeutic intervention  .  Plan: Follow up with behavioral health clinician on : Two weeks Behavioral recommendations:  -Continue using self-coping strategies daily as needed (music while working,  writing in diary, essential oils) -Continue plan to discuss starting back taking Ashwaganda with PCP to confirm no contraindications -CALM relaxation breathing exercise twice daily (morning; at bedtime with sleep sounds); as needed throughout the day. -Read through information on After Visit Summary/MyChart messages; use as needed Referral(s): Integrated Art gallery manager (In Clinic) and [employer]:  financial help, mom support  I discussed the assessment and treatment plan with the patient and/or parent/guardian. They were provided an opportunity to ask questions and all were answered. They agreed with the plan and demonstrated an understanding of the instructions.   They were advised to call back or seek an in-person evaluation if the symptoms worsen or if the condition fails to  improve as anticipated.  Valetta Close Zeina Akkerman, LCSW     09/04/2022    1:56 PM 05/21/2022    4:36 PM 03/21/2022   10:19 AM 12/05/2021  2:51 PM 11/15/2015    5:11 PM  Depression screen PHQ 2/9  Decreased Interest 0 0 0 0 0  Down, Depressed, Hopeless 1 0 0 3 0  PHQ - 2 Score 1 0 0 3 0  Altered sleeping 0 1 1 2 1   Tired, decreased energy 1 1 1  0 0  Change in appetite 3 0 2 3 0  Feeling bad or failure about yourself  1 0 0 2 0  Trouble concentrating 3 0 0 0 0  Moving slowly or fidgety/restless 0 0 0 0 0  Suicidal thoughts 0 0 0 0 0  PHQ-9 Score 9 2 4 10 1       09/04/2022    1:59 PM 05/21/2022    4:37 PM 03/21/2022   10:19 AM 12/05/2021    2:55 PM  GAD 7 : Generalized Anxiety Score  Nervous, Anxious, on Edge 0 0 1 1  Control/stop worrying 1 1 0 2  Worry too much - different things 1 1 0 2  Trouble relaxing 0 0 0 0  Restless 0 0 0 1  Easily annoyed or irritable 3 0 1 0  Afraid - awful might happen 1 0 0 0  Total GAD 7 Score 6 2 2 6       06/21/2022    8:35 AM 05/31/2019    5:03 AM  Edinburgh Postnatal Depression Scale Screening Tool  I have been able to laugh and see the funny side of things. 0 0  I have looked forward with enjoyment to things. 0 0  I have blamed myself unnecessarily when things went wrong. 1 1  I have been anxious or worried for no good reason. 0 0  I have felt scared or panicky for no good reason. 0 0  Things have been getting on top of me. 0 0  I have been so unhappy that I have had difficulty sleeping. 0 0  I have felt sad or miserable. 0 0  I have been so unhappy that I have been crying. 0 0  The thought of harming myself has occurred to me. 0 0  Edinburgh Postnatal Depression Scale Total 1 1

## 2022-09-04 ENCOUNTER — Ambulatory Visit: Payer: Medicaid Other | Admitting: Clinical

## 2022-09-04 DIAGNOSIS — F4321 Adjustment disorder with depressed mood: Secondary | ICD-10-CM | POA: Diagnosis not present

## 2022-09-04 DIAGNOSIS — Z658 Other specified problems related to psychosocial circumstances: Secondary | ICD-10-CM

## 2022-09-04 NOTE — Patient Instructions (Addendum)
[facility] for [facility] at [facility] [address] [place], [place] [address] [phone] (main office) [phone] (Lavanda Nevels's office)  Authoracare (Individual and group grief support)  Authoracare.Gerre Scull  [phone]  New Parent Support Groups www.postpartum.net www.conehealthybaby.com  Housing Resources                    [employer] (serves [place], [place], [place], [place], [place], [place], [place], [place], [place], [place] [employer], [place], [place], and [place] counties) [address], [place], [place] [address] [phone] DeveloperU.ch  **Rental assistance, Home Rehabilitation,Weatherization Assistance Program, Chief Financial Officer, Housing Voucher Program  [employer] for Housing and [employer] Studies: Proofreader Resources to residents of [place], [place], and [place] Make sure you have your documents ready, including:  (Household income verification: 2 months pay stubs, unemployment/social security award letter, statement of no income for all household members over 43) Photo ID for all household members over 18 Utility Bill/Rent Ledger/Lease: must show past due amount for utilities/rent, or the rental agreement if rent is current 2. Start your application online or by paper (in [place] or [place]) at:     [email or web address].[email or web address]  3. Once you have completed the online application, you will get an email confirmation message from the county. Expect to hear back by phone or email at least 6-10 weeks from submitting your application.  4. While you wait:  Call [phone] to check in on your application Let your landlord know that you've applied. Your landlord will be asked to submit documents (W-9) during this application process. Payments will be made directly to the landlord/property management company and  utility company Rent or utility assistance for [employer], [place], and [place] Apply at [email or web address] Questions? Call or email Renee at [phone] or [email or web address]   Eviction Mediation Program: The HOPE Program Https://www.rebuild.BedroomRental.com.cy HOPE Progam serves low-income renters in [address] [place] counties, defined as less than or equal to 80% of the area median income for the county where the renter lives. In the following 12 counties, you should apply to your local rent and utility assistance program INSTEAD OF the HOPE Program: [place], [place], [place], [place], [place], [place], [place], [place], [place], [place], [place], [place]  If you live outside of [place], contact HOPE call center at [phone] to talk to a Program Representative Monday-Friday, 8am-5pm Note that Native American tribes also received federal funding for rent and utility assistance programs. Recognized members of the following tribes will be served by programs managed by tribal governments, including: [place], [place], [place], [place] of [place] and [facility] Eviction Mediation Coordinator, [place], [phone] [email or web address] .[employer], [place], [phone] [email or web address]   Housing Resources [facility] [place] [address], [place], [place] [address] [phone] www.gha-Anza.[facility] [address] Tawny Hopping [place], [place] [address] [phone] PhoneCaptions.ch **Programs include: Hospital doctor and Housing Counseling, Healthy Radiographer, therapeutic, Homeless Prevention and Housing Assistance  Government [facility] [address], [place], [place] [address] [phone] www.PaintingEmporium.co.za **housing applications/recertification; tax payment  relief/exemption under specific qualifications  [facility] [address] [place], [place] [address] www.onlinegreensboro.com/~maryshouse **transitional housing for women in recovery who have minor children or are pregnant  [facility] [address] [place], [place], [place] [address] https://johnson-smith.net/  **emergency shelter and support services for families facing homelessness  [facility] [address] [place], [place] [address] [phone]  www.youthfocus.org **transitional housing to pregnant women; emergency housing for youth who have run away, are experiencing a family crisis, are victims of abuse or neglect, or are homeless  [facility] [address], [place], [place] [address] [phone] ircgso.org **Drop-in center for people experiencing homelessness; overnight warming center when temperature is 25 degrees or below  [employer] [address] [place], [place], [place] [address] [phone] https://reentrystaffingagency.org/ **help with affordable housing to people experiencing homelessness or unemployment due to incarceration  [facility] [address], [place], [place] [address] [phone] www.greensborourbanministry.org  **emergency and transitional housing, rent/mortgage assistance, utility assistance  [facility] [address] [place], [place] [address] [phone] www.salvationarmyofgreensboro.org **emergency and transitional housing  Habitat for [employer] [address], [place], [place] [address] [phone] Www.habitatgreensboro.[employer]   [employer] [address], [place], [place] [address] [phone] https://chshousing.org **Home Ownership/Affordable Housing Program and [facility]  [employer] [employer] [facility] [address], [place], [place] [address] [phone] PaidValue.com.cy **home buyer education courses, foreclosure prevention  [facility] [address], [place], [place] [address] [phone] DefMagazine.is **Environmental Exposure Assessment (investigation of homes where either children or pregnant women with a confirmed elevated blood lead level reside)  [facility] [address] Nat Math [place], [place] [address] [phone] ShowReturn.ca **Home Expense Assistance/Repairs Program; offers home accessibility updates, such as ramps or bars in the bathroom  [facility] [address], [place], [place] [address] [phone] https://www.self-help.org/locations/[place]-branch **Offers credit-building and banking services to people unable to use traditional banking   Housing Resources [facility]  Housing Authority- [place] of [facility] [address] Cinda Quest [place], [place] [address] [phone] ChatRepair.pl   [facility] [address] [place], [place] [address] [phone]  WrestlingMonthly.pl **housing applications/recertification, emergency and transitional housing  [employer] of [employer] [address] [place], [place] [address] [phone] www.odm-hp.org  **emergency and permanent housing; rent/mortgage payment assistance  Habitat for [employer], [place] and [place] [address] [place], [place] [address] [phone] https://russell-walls.com/  Family Services of the [place], [employer] [address] [place], [place], [place] [address] www.familyservice-piedmont.org **emergency shelter for victims of domestic violence and sexual assault  [facility] [address], [place], [place] [address] [phone] www.senior-resources-guilford.org **Home  expense assistance/repairs for older adults  Housing Resources [facility] [place] [address], [place], [place] [address] [phone] http://cohen-reilly.biz/  **May offer help with minor housing repairs  [facility] [address] [place], [place] [address] [phone] **emergency housing for victims of domestic violence  Housing Resources [place], [place], [place] [facility])  [facility] [address], [place], [place] [address] [phone] www.newrha.[facility] [address], [place], [place] [address] [phone]  [facility] [address], [place], [place] [address] [phone] www.co.[place].Chaplin.us **Housing applications/recertification; tax payment relief/exemption w specific qualifications  [facility] Help for Homeless [address] [place], [place] [address] [phone] Http://www.rchelpforhomeless.org/HOME_PAGE.html  HELP, Incorporated [facility] [address] [place], [place] [address] [phone] 24 Hour Crisis Line [phone] Hours of Operation: Monday-Friday, 8:30am-5:00pm http://helpincorporated.org **Includes emergency housing for victims of domestic violence    /Emotional Producer, television/film/video and Websites Here are a few free apps meant to help you to help yourself.  To find, try searching on the internet  to see if the app is offered on Apple/Android devices. If your first choice doesn't come up on your device, the good news is that there are many choices! Play around with different apps to see which ones are helpful to you.    Calm This is an app meant to help increase calm feelings. Includes info, strategies, and tools for tracking your feelings.      Calm Harm  This app is meant to help with self-harm. Provides many 5-minute or 15-min coping strategies for doing instead of  hurting yourself.       Healthy Minds Health Minds is a problem-solving tool to help deal with emotions and cope with stress you encounter wherever you are.      MindShift This app can help people cope with anxiety. Rather than trying to avoid anxiety, you can make an important shift and face it.      MY3  MY3 features a support system, safety plan and resources with the goal of offering a tool to use in a time of need.       My Life My Voice  This mood journal offers a simple solution for tracking your thoughts, feelings and moods. Animated emoticons can help identify your mood.       Relax Melodies Designed to help with sleep, on this app you can mix sounds and meditations for relaxation.      Smiling Mind Smiling Mind is meditation made easy: it's a simple tool that helps put a smile on your mind.        Stop, Breathe & Think  A friendly, simple guide for people through meditations for mindfulness and compassion.  Stop, Breathe and Think Kids Enter your current feelings and choose a "mission" to help you cope. Offers videos for certain moods instead of just sound recordings.       Team Orange The goal of this tool is to help teens change how they think, act, and react. This app helps you focus on your own good feelings and experiences.      The United Stationers Box The United Stationers Box (VHB) contains simple tools to help patients with coping, relaxation, distraction, and positive thinking.

## 2022-09-05 NOTE — BH Specialist Note (Signed)
Pt requests to reschedule due to unexpected day; will call back and reschedule at a time/day of her choice.

## 2022-09-18 ENCOUNTER — Ambulatory Visit: Payer: Medicaid Other

## 2022-09-18 DIAGNOSIS — Z91199 Patient's noncompliance with other medical treatment and regimen due to unspecified reason: Secondary | ICD-10-CM

## 2022-11-02 NOTE — Therapy (Deleted)
OUTPATIENT PHYSICAL THERAPY FEMALE PELVIC EVALUATION   Patient Name: Cheryl Bright MRN: [id number] DOB:1986-02-02, 37 y.o., female Today's Date: 11/02/2022  END OF SESSION:   Past Medical History:  Diagnosis Date   Depression    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, antepartum 03/11/2018   MS (multiple sclerosis) (HCC)    PCOS (polycystic ovarian syndrome) 09/15/2012   Secondary oligomenorrhea and hirsutism> suspect anovulatory, PCOS> metformin started 7/14   Pregnancy induced hypertension    Sickle cell trait (HCC)    Past Surgical History:  Procedure Laterality Date   CESAREAN SECTION     DILATION AND EVACUATION N/A 03/06/2018   Procedure: DILATATION AND EVACUATION;  Surgeon: Tereso Newcomer, MD;  Location: [facility];  Service: Gynecology;  Laterality: N/A;   FOOT SURGERY     Patient Active Problem List   Diagnosis Date Noted   History of gestational hypertension 06/20/2022   BMI 38.0-38.9,adult 06/14/2022   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  Rationale for Evaluation and Treatment: Rehabilitation  ONSET DATE: ***  SUBJECTIVE:                                                                                                                                                                                           SUBJECTIVE STATEMENT: 06/20/22 vaginal birth Fluid intake: {Yes/No:304960894}   PAIN:  Are you having pain? {yes/no:20286} NPRS scale: ***/10 Pain location: {pelvic pain location:27098}  Pain type: {type:313116} Pain description: {PAIN DESCRIPTION:21022940}   Aggravating factors: *** Relieving factors: ***  PRECAUTIONS: None  RED FLAGS: {PT Red  Flags:29287}   WEIGHT BEARING RESTRICTIONS: No  FALLS:  Has patient fallen in last 6 months? {fallsyesno:27318}  LIVING ENVIRONMENT: Lives with: {OPRC lives with:25569::"lives with their family"} Lives in: {Lives in:[address]} Stairs: {opstairs:27293} Has following equipment at home: {Assistive devices:23999}  OCCUPATION: ***  PLOF: {PLOF:24004}  PATIENT GOALS: ***  PERTINENT HISTORY:  C-section; PCOS; MS, sickle cell trait Sexual abuse: {Yes/No:304960894}  BOWEL MOVEMENT: Pain with bowel movement: {yes/no:20286} Type of bowel movement:{PT BM type:27100} Fully empty rectum: {Yes/No:304960894} Leakage: {Yes/No:304960894} Pads: {Yes/No:304960894} Fiber supplement: {Yes/No:304960894}  URINATION: Pain with urination: {yes/no:20286} Fully empty bladder: {Yes/No:304960894} Stream: {PT urination:27102} Urgency: {Yes/No:304960894} Frequency: *** Leakage: {PT leakage:27103} Pads: {Yes/No:304960894}  INTERCOURSE: Pain with intercourse: {pain with intercourse PA:27099} Ability to have vaginal penetration:  {Yes/No:304960894} Climax: *** Marinoff Scale: ***/  3  PREGNANCY: Vaginal deliveries *** Tearing {Yes***/No:304960894} C-section deliveries *** Currently pregnant {Yes***/No:304960894}  PROLAPSE: {PT prolapse:27101}   OBJECTIVE:   DIAGNOSTIC FINDINGS:  ***  PATIENT SURVEYS:  {rehab surveys:24030}  PFIQ-7 ***  COGNITION: Overall cognitive status: {cognition:24006}     SENSATION: Light touch: {intact/deficits:24005} Proprioception: {intact/deficits:24005}  MUSCLE LENGTH: Hamstrings: Right *** deg; Left *** deg Thomas test: Right *** deg; Left *** deg  LUMBAR SPECIAL TESTS:  {lumbar special test:25242}  FUNCTIONAL TESTS:  {Functional tests:24029}  GAIT: Distance walked: *** Assistive device utilized: {Assistive devices:23999} Level of assistance: {Levels of assistance:24026} Comments: ***  POSTURE: {posture:25561}  PELVIC  ALIGNMENT:  LUMBARAROM/PROM:  A/PROM A/PROM  eval  Flexion   Extension   Right lateral flexion   Left lateral flexion   Right rotation   Left rotation    (Blank rows = not tested)  LOWER EXTREMITY ROM:  {AROM/PROM:27142} ROM Right eval Left eval  Hip flexion    Hip extension    Hip abduction    Hip adduction    Hip internal rotation    Hip external rotation    Knee flexion    Knee extension    Ankle dorsiflexion    Ankle plantarflexion    Ankle inversion    Ankle eversion     (Blank rows = not tested)  LOWER EXTREMITY MMT:  MMT Right eval Left eval  Hip flexion    Hip extension    Hip abduction    Hip adduction    Hip internal rotation    Hip external rotation    Knee flexion    Knee extension    Ankle dorsiflexion    Ankle plantarflexion    Ankle inversion    Ankle eversion     PALPATION:   General  ***                External Perineal Exam ***                             Internal Pelvic Floor ***  Patient confirms identification and approves PT to assess internal pelvic floor and treatment {yes/no:20286}  PELVIC MMT:   MMT eval  Vaginal   Internal Anal Sphincter   External Anal Sphincter   Puborectalis   Diastasis Recti   (Blank rows = not tested)        TONE: ***  PROLAPSE: ***  TODAY'S TREATMENT:                                                                                                                              DATE: ***  EVAL ***   PATIENT EDUCATION:  Education details: *** Person educated: {Person educated:25204} Education method: {Education Method:25205} Education comprehension: {Education Comprehension:25206}  HOME EXERCISE PROGRAM: ***  ASSESSMENT:  CLINICAL IMPRESSION: Patient is a *** y.o. *** who was seen today for physical therapy evaluation and treatment for ***.  OBJECTIVE IMPAIRMENTS: {opptimpairments:25111}.   ACTIVITY LIMITATIONS: {activitylimitations:27494}  PARTICIPATION LIMITATIONS:  {participationrestrictions:25113}  PERSONAL FACTORS: {Personal factors:25162} are also affecting patient's functional outcome.   REHAB POTENTIAL: {rehabpotential:25112}  CLINICAL DECISION MAKING: {clinical decision making:25114}  EVALUATION COMPLEXITY: {Evaluation complexity:25115}   GOALS: Goals reviewed with patient? {yes/no:20286}  SHORT TERM GOALS: Target date: ***  *** Baseline: Goal status: INITIAL  2.  *** Baseline:  Goal status: INITIAL  3.  *** Baseline:  Goal status: INITIAL  4.  *** Baseline:  Goal status: INITIAL  5.  *** Baseline:  Goal status: INITIAL  6.  *** Baseline:  Goal status: INITIAL  LONG TERM GOALS: Target date: ***  *** Baseline:  Goal status: INITIAL  2.  *** Baseline:  Goal status: INITIAL  3.  *** Baseline:  Goal status: INITIAL  4.  *** Baseline:  Goal status: INITIAL  5.  *** Baseline:  Goal status: INITIAL  6.  *** Baseline:  Goal status: INITIAL  PLAN:  PT FREQUENCY: {rehab frequency:25116}  PT DURATION: {rehab duration:25117}  PLANNED INTERVENTIONS: {rehab planned interventions:25118::"Therapeutic exercises","Therapeutic activity","Neuromuscular re-education","Balance training","Gait training","Patient/Family education","Self Care","Joint mobilization"}  PLAN FOR NEXT SESSION: ***   Karena Kinker, PT 11/02/2022, 10:46 AM

## 2022-11-06 ENCOUNTER — Encounter: Payer: Medicaid Other | Attending: Advanced Practice Midwife | Admitting: Physical Therapy

## 2022-11-13 ENCOUNTER — Encounter: Payer: Medicaid Other | Admitting: Physical Therapy

## 2022-11-20 ENCOUNTER — Encounter: Payer: Medicaid Other | Admitting: Physical Therapy

## 2022-11-27 ENCOUNTER — Encounter: Payer: Medicaid Other | Admitting: Physical Therapy

## 2023-03-11 NOTE — Therapy (Deleted)
 OUTPATIENT PHYSICAL THERAPY FEMALE PELVIC EVALUATION   Patient Name: Cheryl Bright MRN: [id number] DOB:1985-12-16, 38 y.o., female Today's Date: 03/11/2023  END OF SESSION:   Past Medical History:  Diagnosis Date   Depression    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, antepartum 03/11/2018   MS (multiple sclerosis) (HCC)    PCOS (polycystic ovarian syndrome) 09/15/2012   Secondary oligomenorrhea and hirsutism> suspect anovulatory, PCOS> metformin  started 7/14   Pregnancy induced hypertension    Sickle cell trait (HCC)    Past Surgical History:  Procedure Laterality Date   CESAREAN SECTION     DILATION AND EVACUATION N/A 03/06/2018   Procedure: DILATATION AND EVACUATION;  Surgeon: Herchel Gloris DELENA, MD;  Location: [facility];  Service: Gynecology;  Laterality: N/A;   FOOT SURGERY     Patient Active Problem List   Diagnosis Date Noted   History of gestational hypertension 06/20/2022   BMI 38.0-38.9,adult 06/14/2022   Grand multiparity 01/04/2022   Sickle cell trait (HCC) 01/04/2022   History of VBAC 12/05/2021   Multiple sclerosis during pregnancy (HCC) 12/05/2021    PCP: Lenon boyer, FNP  REFERRING PROVIDER: Milly Olam DELENA, CNM   REFERRING DIAG: N39.3 (ICD-10-CM) - Stress incontinence, female   THERAPY DIAG:  No diagnosis found.  Rationale for Evaluation and Treatment: Rehabilitation  ONSET DATE: ***  SUBJECTIVE:                                                                                                                                                                                           SUBJECTIVE STATEMENT: 06/20/22 Fluid intake: {Yes/No:304960894}   PAIN:  Are you having pain? {yes/no:20286} NPRS scale: ***/10 Pain location: {pelvic pain location:27098}  Pain type: {type:313116} Pain description: {PAIN DESCRIPTION:21022940}   Aggravating factors: *** Relieving factors: ***  PRECAUTIONS: {Therapy precautions:24002}  RED  FLAGS: {PT Red Flags:29287}   WEIGHT BEARING RESTRICTIONS: {Yes ***/No:24003}  FALLS:  Has patient fallen in last 6 months? {fallsyesno:27318}  LIVING ENVIRONMENT: Lives with: {[facility] lives with:25569::lives with their family} Lives in: {Lives in:25570} Stairs: {opstairs:27293} Has following equipment at home: {Assistive devices:23999}  OCCUPATION: ***  PLOF: {PLOF:24004}  PATIENT GOALS: ***  PERTINENT HISTORY:  Multiple sclerosis; POCS; Cesarean section; Sickle Cell trait Sexual abuse: {Yes/No:304960894}  BOWEL MOVEMENT: Pain with bowel movement: {yes/no:20286} Type of bowel movement:{PT BM type:27100} Fully empty rectum: {Yes/No:304960894} Leakage: {Yes/No:304960894} Pads: {Yes/No:304960894} Fiber supplement: {Yes/No:304960894}  URINATION: Pain with urination: {yes/no:20286} Fully empty bladder: {Yes/No:304960894} Stream: {PT urination:27102} Urgency: {Yes/No:304960894} Frequency: *** Leakage: {PT leakage:27103} Pads: {Yes/No:304960894}  INTERCOURSE: Pain with intercourse: {pain with intercourse PA:27099} Ability to have vaginal penetration:  {Yes/No:304960894} Climax: ***  Marinoff Scale: ***/3  PREGNANCY: Vaginal deliveries *** Tearing {Yes***/No:304960894} C-section deliveries *** Currently pregnant {Yes***/No:304960894}  PROLAPSE: {PT prolapse:27101}   OBJECTIVE:  Note: Objective measures were completed at Evaluation unless otherwise noted.  DIAGNOSTIC FINDINGS:  ***  PATIENT SURVEYS:  {rehab surveys:24030}  PFIQ-7 ***  COGNITION: Overall cognitive status: {cognition:24006}     SENSATION: Light touch: {intact/deficits:24005} Proprioception: {intact/deficits:24005}  MUSCLE LENGTH: Hamstrings: Right *** deg; Left *** deg Thomas test: Right *** deg; Left *** deg  LUMBAR SPECIAL TESTS:  {lumbar special test:25242}  FUNCTIONAL TESTS:  {Functional tests:24029}  GAIT: Distance walked: *** Assistive device utilized: {Assistive  devices:23999} Level of assistance: {Levels of assistance:24026} Comments: ***  POSTURE: {posture:25561}  PELVIC ALIGNMENT:  LUMBARAROM/PROM:  A/PROM A/PROM  eval  Flexion   Extension   Right lateral flexion   Left lateral flexion   Right rotation   Left rotation    (Blank rows = not tested)  LOWER EXTREMITY ROM:  {AROM/PROM:27142} ROM Right eval Left eval  Hip flexion    Hip extension    Hip abduction    Hip adduction    Hip internal rotation    Hip external rotation    Knee flexion    Knee extension    Ankle dorsiflexion    Ankle plantarflexion    Ankle inversion    Ankle eversion     (Blank rows = not tested)  LOWER EXTREMITY MMT:  MMT Right eval Left eval  Hip flexion    Hip extension    Hip abduction    Hip adduction    Hip internal rotation    Hip external rotation    Knee flexion    Knee extension    Ankle dorsiflexion    Ankle plantarflexion    Ankle inversion    Ankle eversion     PALPATION:   General  ***                External Perineal Exam ***                             Internal Pelvic Floor ***  Patient confirms identification and approves PT to assess internal pelvic floor and treatment {yes/no:20286}  PELVIC MMT:   MMT eval  Vaginal   Internal Anal Sphincter   External Anal Sphincter   Puborectalis   Diastasis Recti   (Blank rows = not tested)        TONE: ***  PROLAPSE: ***  TODAY'S TREATMENT:                                                                                                                              DATE: ***  EVAL ***   PATIENT EDUCATION:  Education details: *** Person educated: {Person educated:25204} Education method: {Education Method:25205} Education comprehension: {Education Comprehension:25206}  HOME EXERCISE PROGRAM: ***  ASSESSMENT:  CLINICAL IMPRESSION: Patient is a *** y.o. *** who  was seen today for physical therapy evaluation and treatment for ***.   OBJECTIVE  IMPAIRMENTS: {opptimpairments:25111}.   ACTIVITY LIMITATIONS: {activitylimitations:27494}  PARTICIPATION LIMITATIONS: {participationrestrictions:25113}  PERSONAL FACTORS: {Personal factors:25162} are also affecting patient's functional outcome.   REHAB POTENTIAL: {rehabpotential:25112}  CLINICAL DECISION MAKING: {clinical decision making:25114}  EVALUATION COMPLEXITY: {Evaluation complexity:25115}   GOALS: Goals reviewed with patient? {yes/no:20286}  SHORT TERM GOALS: Target date: ***  *** Baseline: Goal status: INITIAL  2.  *** Baseline:  Goal status: INITIAL  3.  *** Baseline:  Goal status: INITIAL  4.  *** Baseline:  Goal status: INITIAL  5.  *** Baseline:  Goal status: INITIAL  6.  *** Baseline:  Goal status: INITIAL  LONG TERM GOALS: Target date: ***  *** Baseline:  Goal status: INITIAL  2.  *** Baseline:  Goal status: INITIAL  3.  *** Baseline:  Goal status: INITIAL  4.  *** Baseline:  Goal status: INITIAL  5.  *** Baseline:  Goal status: INITIAL  6.  *** Baseline:  Goal status: INITIAL  PLAN:  PT FREQUENCY: {rehab frequency:25116}  PT DURATION: {rehab duration:25117}  PLANNED INTERVENTIONS: {rehab planned interventions:25118::97110-Therapeutic exercises,97530- Therapeutic [phone]- Neuromuscular re-education,97535- Self Rjmz,02859- Manual therapy}  PLAN FOR NEXT SESSION: ***   Merlinda Wrubel, PT 03/11/2023, 11:01 AM

## 2023-03-12 ENCOUNTER — Encounter: Payer: Medicaid Other | Attending: Advanced Practice Midwife | Admitting: Physical Therapy

## 2023-03-19 ENCOUNTER — Encounter: Payer: Medicaid Other | Admitting: Physical Therapy

## 2023-03-26 ENCOUNTER — Encounter: Payer: Medicaid Other | Admitting: Physical Therapy

## 2023-04-02 ENCOUNTER — Encounter: Payer: Medicaid Other | Admitting: Physical Therapy

## 2024-01-08 ENCOUNTER — Other Ambulatory Visit: Payer: Self-pay

## 2024-01-08 ENCOUNTER — Emergency Department (HOSPITAL_BASED_OUTPATIENT_CLINIC_OR_DEPARTMENT_OTHER)
Admission: EM | Admit: 2024-01-08 | Discharge: 2024-01-08 | Disposition: A | Discharge status: Home / Self Care | Attending: Emergency Medicine | Admitting: Emergency Medicine

## 2024-01-08 DIAGNOSIS — J029 Acute pharyngitis, unspecified: Secondary | ICD-10-CM | POA: Insufficient documentation

## 2024-01-08 LAB — RESP PANEL BY RT-PCR (RSV, FLU A&B, COVID)  RVPGX2
Influenza A by PCR: NEGATIVE
Influenza B by PCR: NEGATIVE
Resp Syncytial Virus by PCR: NEGATIVE
SARS Coronavirus 2 by RT PCR: NEGATIVE

## 2024-01-08 LAB — GROUP A STREP BY PCR: Group A Strep by PCR: NOT DETECTED

## 2024-01-08 MED ORDER — IBUPROFEN 800 MG PO TABS: 800.0000 mg | ORAL_TABLET | Freq: Three times a day (TID) | ORAL | 0 refills | Status: AC | PRN

## 2024-01-08 MED ORDER — DEXAMETHASONE SOD PHOSPHATE PF 10 MG/ML IJ SOLN
10.0000 mg | Freq: Once | INTRAMUSCULAR | Status: AC
  Administered 2024-01-08: 10 mg via INTRAMUSCULAR

## 2024-01-08 MED ORDER — AMOXICILLIN-POT CLAVULANATE 875-125 MG PO TABS: 1.0000 | ORAL_TABLET | Freq: Two times a day (BID) | ORAL | 0 refills | Status: AC

## 2024-01-08 NOTE — ED Triage Notes (Signed)
 Reports sore throat, congestion & right ear pain X 3 days.

## 2024-01-08 NOTE — Discharge Instructions (Signed)
 You were seen emerged from today with sore throat.  I am starting you on antibiotic and steroid medications.  You may take Tylenol  and/or ibuprofen  as needed for pain symptoms.  This should improve your sore throat over the next several days but you should complete the antibiotics.  Return with any new or suddenly worsening symptoms such as trouble breathing or swallowing, fevers, sudden/severe pain.

## 2024-01-08 NOTE — ED Provider Notes (Signed)
 Emergency Department Provider Note   I have reviewed the triage vital signs and the nursing notes.   HISTORY  Chief Complaint Sore Throat   HPI Cheryl Bright is a 38 y.o. female past history reviewed below presents to the emergency department with sore throat.  Patient is feeling sore throat with some pain into the jaw and right ear.  No drainage or blood from the ear.  No fevers or chills.  No difficulty swallowing other than pain.  No shortness of breath or change in voice.  Symptoms been worsening for the past 3 days.   Past Medical History:  Diagnosis Date   Depression    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, antepartum 03/11/2018   MS (multiple sclerosis)    PCOS (polycystic ovarian syndrome) 09/15/2012   Secondary oligomenorrhea and hirsutism> suspect anovulatory, PCOS> metformin  started 7/14   Pregnancy induced hypertension    Sickle cell trait     Review of Systems  Constitutional: No fever/chills ENT: Positive sore throat. Cardiovascular: Denies chest pain. Respiratory: Denies shortness of breath. Gastrointestinal: No abdominal pain. No vomiting.  Skin: Negative for rash. Neurological: Mild HA.   ____________________________________________   PHYSICAL EXAM:  VITAL SIGNS: ED Triage Vitals  Encounter Vitals Group     BP 01/08/24 0946 (!) 157/114     Pulse Rate 01/08/24 0946 88     Resp 01/08/24 0946 18     Temp 01/08/24 0946 98.6 F (37 C)     Temp Source 01/08/24 0946 Oral     SpO2 01/08/24 0946 97 %   Constitutional: Alert and oriented. Well appearing and in no acute distress. Eyes: Conjunctivae are normal.  Head: Atraumatic. Nose: No congestion/rhinnorhea. Mouth/Throat: Mucous membranes are moist.  Oropharynx erythematous posteriorly with increased erythema on the right. No exudate. No obvious PTA. Clear voice. Managing oral secretions.  Ears: Normal TMs bilaterally.  No otitis externa.  Cerumen buildup bilaterally but no impaction.   Able to visualize the TMs adequately.  Neck: No stridor.  Cardiovascular: Normal rate, regular rhythm. Good peripheral circulation. Grossly normal heart sounds.   Respiratory: Normal respiratory effort.  No retractions. Lungs CTAB. Gastrointestinal:No distention.  Musculoskeletal: No gross deformities of extremities. Neurologic:  Normal speech and language. Skin:  Skin is warm, dry and intact. No rash noted.  ____________________________________________   LABS (all labs ordered are listed, but only abnormal results are displayed)  Labs Reviewed  RESP PANEL BY RT-PCR (RSV, FLU A&B, COVID)  RVPGX2  GROUP A STREP BY PCR   ____________________________________________   PROCEDURES  Procedure(s) performed:   Procedures  None  ____________________________________________   INITIAL IMPRESSION / ASSESSMENT AND PLAN / ED COURSE  Pertinent labs & imaging results that were available during my care of the patient were reviewed by me and considered in my medical decision making (see chart for details).   This patient is Presenting for Evaluation of sore throat, which does require a range of treatment options, and is a complaint that involves a moderate risk of morbidity and mortality.  The Differential Diagnoses include viral pharyngitis, strep pharyngitis, PTA, dental abscess, OTM, etc.  Critical Interventions-    Medications  dexamethasone  (DECADRON ) injection 10 mg (has no administration in time range)   Medical Decision Making: Summary:  The patient presents the emergency department with sore throat, congestion, right ear pain.  Some erythema to the posterior pharynx without obvious abscess.  Differential includes dental process.  Some mild swelling noted.  Plan to cover with  Augmentin in case developing abscess and Decadron  here with strict ED return precautions.  Patient's presentation is most consistent with acute, uncomplicated illness.   Disposition:  discharge  ____________________________________________  FINAL CLINICAL IMPRESSION(S) / ED DIAGNOSES  Final diagnoses:  Pharyngitis, unspecified etiology     NEW OUTPATIENT MEDICATIONS STARTED DURING THIS VISIT:  New Prescriptions   AMOXICILLIN-CLAVULANATE (AUGMENTIN) 875-125 MG TABLET    Take 1 tablet by mouth every 12 (twelve) hours for 7 days.   IBUPROFEN  (ADVIL ) 800 MG TABLET    Take 1 tablet (800 mg total) by mouth every 8 (eight) hours as needed.    Note:  This document was prepared using Dragon voice recognition software and may include unintentional dictation errors.  Fonda Law, MD, [facility]    Tannar Broker, Fonda MATSU, MD 01/08/24 1051

## 2024-02-29 ENCOUNTER — Encounter (HOSPITAL_BASED_OUTPATIENT_CLINIC_OR_DEPARTMENT_OTHER): Payer: Self-pay

## 2024-02-29 ENCOUNTER — Other Ambulatory Visit: Payer: Self-pay

## 2024-02-29 ENCOUNTER — Emergency Department (HOSPITAL_BASED_OUTPATIENT_CLINIC_OR_DEPARTMENT_OTHER)
Admission: EM | Admit: 2024-02-29 | Discharge: 2024-02-29 | Disposition: A | Discharge status: Home / Self Care | Attending: Emergency Medicine | Admitting: Emergency Medicine

## 2024-02-29 DIAGNOSIS — R10A1 Flank pain, right side: Secondary | ICD-10-CM | POA: Diagnosis present

## 2024-02-29 DIAGNOSIS — E876 Hypokalemia: Secondary | ICD-10-CM | POA: Diagnosis not present

## 2024-02-29 DIAGNOSIS — R059 Cough, unspecified: Secondary | ICD-10-CM | POA: Diagnosis not present

## 2024-02-29 DIAGNOSIS — N12 Tubulo-interstitial nephritis, not specified as acute or chronic: Secondary | ICD-10-CM | POA: Diagnosis not present

## 2024-02-29 DIAGNOSIS — Z91040 Latex allergy status: Secondary | ICD-10-CM | POA: Diagnosis not present

## 2024-02-29 LAB — COMPREHENSIVE METABOLIC PANEL WITH GFR
ALT: 25 U/L (ref 0–44)
AST: 19 U/L (ref 15–41)
Albumin: 3.7 g/dL (ref 3.5–5.0)
Alkaline Phosphatase: 94 U/L (ref 38–126)
Anion gap: 12 (ref 5–15)
BUN: 9 mg/dL (ref 6–20)
CO2: 20 mmol/L — ABNORMAL LOW (ref 22–32)
Calcium: 8.8 mg/dL — ABNORMAL LOW (ref 8.9–10.3)
Chloride: 103 mmol/L (ref 98–111)
Creatinine, Ser: 0.73 mg/dL (ref 0.44–1.00)
GFR, Estimated: >60 mL/min
Glucose, Bld: 106 mg/dL — ABNORMAL HIGH (ref 70–99)
Potassium: 3.1 mmol/L — ABNORMAL LOW (ref 3.5–5.1)
Sodium: 136 mmol/L (ref 135–145)
Total Bilirubin: 0.4 mg/dL (ref 0.0–1.2)
Total Protein: 7.1 g/dL (ref 6.5–8.1)

## 2024-02-29 LAB — URINALYSIS, ROUTINE W REFLEX MICROSCOPIC
Bilirubin Urine: NEGATIVE
Glucose, UA: NEGATIVE mg/dL
Ketones, ur: NEGATIVE mg/dL
Nitrite: POSITIVE — AB
Protein, ur: 100 mg/dL — AB
Specific Gravity, Urine: 1.01 (ref 1.005–1.030)
pH: 6 (ref 5.0–8.0)

## 2024-02-29 LAB — CBC WITH DIFFERENTIAL/PLATELET
Abs Immature Granulocytes: 0.05 K/uL (ref 0.00–0.07)
Basophils Absolute: 0 K/uL (ref 0.0–0.1)
Basophils Relative: 0 %
Eosinophils Absolute: 0.1 K/uL (ref 0.0–0.5)
Eosinophils Relative: 1 %
HCT: 36.8 % (ref 36.0–46.0)
Hemoglobin: 12.9 g/dL (ref 12.0–15.0)
Immature Granulocytes: 1 %
Lymphocytes Relative: 17 %
Lymphs Abs: 1.6 K/uL (ref 0.7–4.0)
MCH: 30.5 pg (ref 26.0–34.0)
MCHC: 35.1 g/dL (ref 30.0–36.0)
MCV: 87 fL (ref 80.0–100.0)
Monocytes Absolute: 1.5 K/uL — ABNORMAL HIGH (ref 0.1–1.0)
Monocytes Relative: 16 %
Neutro Abs: 5.9 K/uL (ref 1.7–7.7)
Neutrophils Relative %: 65 %
Platelets: 264 K/uL (ref 150–400)
RBC: 4.23 MIL/uL (ref 3.87–5.11)
RDW: 13.5 % (ref 11.5–15.5)
WBC: 9.1 K/uL (ref 4.0–10.5)
nRBC: 0 % (ref 0.0–0.2)

## 2024-02-29 LAB — URINALYSIS, MICROSCOPIC (REFLEX)

## 2024-02-29 LAB — RESP PANEL BY RT-PCR (RSV, FLU A&B, COVID)  RVPGX2
Influenza A by PCR: NEGATIVE
Influenza B by PCR: NEGATIVE
Resp Syncytial Virus by PCR: NEGATIVE
SARS Coronavirus 2 by RT PCR: NEGATIVE

## 2024-02-29 LAB — PREGNANCY, URINE: Preg Test, Ur: NEGATIVE

## 2024-02-29 MED ORDER — SODIUM CHLORIDE 0.9 % IV SOLN
1.0000 g | Freq: Once | INTRAVENOUS | Status: AC
  Administered 2024-02-29: 1 g via INTRAVENOUS
  Filled 2024-02-29: qty 10

## 2024-02-29 MED ORDER — CIPROFLOXACIN HCL 500 MG PO TABS: 500.0000 mg | ORAL_TABLET | Freq: Two times a day (BID) | ORAL | 0 refills | Status: AC

## 2024-02-29 MED ORDER — POTASSIUM CHLORIDE CRYS ER 20 MEQ PO TBCR
40.0000 meq | EXTENDED_RELEASE_TABLET | Freq: Once | ORAL | Status: AC
  Administered 2024-02-29: 40 meq via ORAL
  Filled 2024-02-29: qty 2

## 2024-02-29 NOTE — ED Provider Notes (Signed)
 " [facility] Provider Note   CSN: [id number] Arrival date & time: 02/29/24  9056     Patient presents with: Flank Pain   Cheryl Bright is a 38 y.o. female.   Patient presents to the emergency department today for evaluation of chills and night sweats, right-sided flank pain.  Symptoms started several days ago.  Patient has had a mild cough.  No documented fevers.  Pain is in the right flank.  Sweating has been so bad she had to change her clothes.  She denies associated dysuria, increased frequency or urgency, hematuria.  She feels like she was a bit constipated at home and took a laxative.  No diarrhea or blood in the stool.  No vomiting.  No chest pain or shortness of breath.  No history of immunocompromise or hemoptysis.       Prior to Admission medications  Medication Sig Start Date End Date Taking? Authorizing Provider  acetaminophen  (TYLENOL ) 325 MG tablet Take 2 tablets (650 mg total) by mouth every 4 (four) hours as needed (for pain scale < 4). Patient not taking: Reported on 07/25/2022 06/21/22   Kandis Devaughn Sayres, Cheryl Bright  furosemide  (LASIX ) 20 MG tablet Take 1 tablet (20 mg total) by mouth daily for 3 doses. 07/28/22 07/31/22  Cleotilde Ronal RAMAN, Cheryl Bright  ibuprofen  (ADVIL ) 800 MG tablet Take 1 tablet (800 mg total) by mouth every 8 (eight) hours as needed. 01/08/24   Long, Fonda MATSU, Cheryl Bright  metoCLOPramide  (REGLAN ) 10 MG tablet Take 1 tablet (10 mg total) by mouth 3 (three) times daily before meals. Patient not taking: Reported on 07/25/2022 07/10/22   Cleotilde Ronal RAMAN, Cheryl Bright  NIFEdipine  (PROCARDIA  XL/NIFEDICAL XL) 60 MG 24 hr tablet Take 1 tablet (60 mg total) by mouth daily. Patient not taking: Reported on 07/25/2022 07/10/22   Cleotilde Ronal RAMAN, Cheryl Bright  NIFEdipine  (ADALAT  CC) 30 MG 24 hr tablet Take 1 tablet (30 mg total) by mouth daily. 06/21/22   Wouk, Devaughn Sayres, Cheryl Bright    Allergies: Sulfa drugs cross reactors and Latex    Review of Systems  Updated Vital  Signs BP (!) 133/90 (BP Location: Right Arm)   Pulse 92   Temp 98.3 F (36.8 C)   Resp 18   LMP 02/26/2024 (Approximate)   SpO2 99%   Physical Exam Vitals and nursing note reviewed.  Constitutional:      General: She is not in acute distress.    Appearance: She is well-developed.  HENT:     Head: Normocephalic and atraumatic.     Right Ear: External ear normal.     Left Ear: External ear normal.     Nose: Nose normal.     Mouth/Throat:     Mouth: Mucous membranes are moist.  Eyes:     Conjunctiva/sclera: Conjunctivae normal.  Cardiovascular:     Rate and Rhythm: Normal rate and regular rhythm.     Heart sounds: No murmur heard. Pulmonary:     Effort: No respiratory distress.     Breath sounds: No wheezing, rhonchi or rales.     Comments: Lungs are clear to auscultation bilaterally.  Patient is breathing normally and no respiratory distress. Abdominal:     Palpations: Abdomen is soft.     Tenderness: There is abdominal tenderness. There is right CVA tenderness. There is no left CVA tenderness, guarding or rebound.     Comments: Mild right sided CVA tenderness and mild right sided lateral abdominal tenderness to  palpation.  No rebound or guarding.  Musculoskeletal:     Cervical back: Normal range of motion and neck supple.     Right lower leg: No edema.     Left lower leg: No edema.  Skin:    General: Skin is warm and dry.     Findings: No rash.  Neurological:     General: No focal deficit present.     Mental Status: She is alert. Mental status is at baseline.     Motor: No weakness.  Psychiatric:        Mood and Affect: Mood normal.     (all labs ordered are listed, but only abnormal results are displayed) Labs Reviewed  URINALYSIS, ROUTINE W REFLEX MICROSCOPIC - Abnormal; Notable for the following components:      Result Value   APPearance HAZY (*)    Hgb urine dipstick MODERATE (*)    Protein, ur 100 (*)    Nitrite POSITIVE (*)    Leukocytes,Ua MODERATE (*)     All other components within normal limits  CBC WITH DIFFERENTIAL/PLATELET - Abnormal; Notable for the following components:   Monocytes Absolute 1.5 (*)    All other components within normal limits  COMPREHENSIVE METABOLIC PANEL WITH GFR - Abnormal; Notable for the following components:   Potassium 3.1 (*)    CO2 20 (*)    Glucose, Bld 106 (*)    Calcium  8.8 (*)    All other components within normal limits  URINALYSIS, MICROSCOPIC (REFLEX) - Abnormal; Notable for the following components:   Bacteria, UA MANY (*)    All other components within normal limits  RESP PANEL BY RT-PCR (RSV, FLU A&B, COVID)  RVPGX2  URINE CULTURE  PREGNANCY, URINE    EKG: None  Radiology: No results found.   Procedures   Medications Ordered in the ED  cefTRIAXone  (ROCEPHIN ) 1 g in sodium chloride  0.9 % 100 mL IVPB (0 g Intravenous Stopped 02/29/24 1220)  potassium chloride  SA (KLOR-CON  M) CR tablet 40 mEq (40 mEq Oral Given 02/29/24 1258)    ED Course  Patient seen and examined. History obtained directly from patient.   Labs/EKG: Viral panel ordered in triage was negative.  UA in process.  I added on CBC, CMP and pregnancy.  Imaging: None ordered.  Medications/Fluids: None ordered.  Most recent vital signs reviewed and are as follows: BP (!) 133/90 (BP Location: Right Arm)   Pulse 92   Temp 98.3 F (36.8 C)   Resp 18   LMP 02/26/2024 (Approximate)   SpO2 99%   Initial impression: Patient with right-sided flank pain, reports night sweats at home with cough.  Overall she is well-appearing.  1:39 PM Reassessment performed. Patient appears stable, comfortable.  Agrees with discharge to home.  Labs personally reviewed and interpreted including: CBC with normal white blood cell count and hemoglobin; CMP shows low potassium at 3.1; respiratory panel negative; UA suggestive of UTI, culture sent; pregnancy negative.  Reviewed pertinent lab work and imaging with patient at bedside. Questions  answered.   Most current vital signs reviewed and are as follows: BP (!) 133/90 (BP Location: Right Arm)   Pulse 92   Temp 98.3 F (36.8 C)   Resp 18   LMP 02/26/2024 (Approximate)   SpO2 99%   Plan: Discharge to home.  Given Cipro  sensitivity around 80%, patient was given a dose of IV Rocephin  and will be discharged to home on oral fluoroquinolone.  Prescriptions written for: Ciprofloxacin  twice daily x  7 days  Other home care instructions discussed: Rest, maintain good hydration, increase potassium in diet  ED return instructions discussed: Uncontrolled pain, persistent vomiting, high fever  Follow-up instructions discussed: Patient encouraged to follow-up with their PCP in 3 days for recheck.                                  Medical Decision Making Amount and/or Complexity of Data Reviewed Labs: ordered.  Risk Prescription drug management.   Patient with flank pain and lateral abdominal pain with reassuring lab work.  UA does suggest UTI.  Patient with some CVA tenderness and chills at night.  Will cover for acute pyelonephritis with Rocephin  and Cipro .  Do not feel that patient requires imaging at this time.  For this patient's complaint of abdominal pain, the following conditions were considered on the differential diagnosis: gastritis/PUD, enteritis/duodenitis, appendicitis, cholelithiasis/cholecystitis, cholangitis, pancreatitis, ruptured viscus, colitis, diverticulitis, small/large bowel obstruction, proctitis, cystitis, pyelonephritis, ureteral colic, aortic dissection, aortic aneurysm. In women, ectopic pregnancy, pelvic inflammatory disease, ovarian cysts, and tubo-ovarian abscess were also considered. Atypical chest etiologies were also considered including ACS, PE, and pneumonia.  The patient's vital signs, pertinent lab work and imaging were reviewed and interpreted as discussed in the ED course. Hospitalization was considered for further testing, treatments, or  serial exams/observation. However as patient is well-appearing, has a stable exam, and reassuring studies today, I do not feel that they warrant admission at this time. This plan was discussed with the patient who verbalizes agreement and comfort with this plan and seems reliable and able to return to the Emergency Department with worsening or changing symptoms.       Final diagnoses:  Pyelonephritis  Hypokalemia    ED Discharge Orders          Ordered    ciprofloxacin  (CIPRO ) 500 MG tablet  2 times daily        02/29/24 1338               Cheryl Chew, Cheryl Bright 02/29/24 1342    Cheryl Donnice PARAS, Cheryl Bright 02/29/24 1431  "

## 2024-02-29 NOTE — Discharge Instructions (Addendum)
 Please read and follow all provided instructions.  Your diagnoses today include:  1. Pyelonephritis     Tests performed today include: Complete blood cell count: Normal white blood cell count Complete metabolic panel: Potassium was a bit low Urinalysis (urine test): Shows sign of urinary infection, culture pending Pregnancy test (urine or blood, in women only): Negative Viral panel: Negative for flu, COVID, RSV Vital signs. See below for your results today.   Medications prescribed:  Ciprofloxacin  - antibiotic  You have been prescribed an antibiotic medicine: take the entire course of medicine even if you are feeling better. Stopping early can cause the antibiotic not to work.  Take any prescribed medications only as directed.  Home care instructions:  Follow any educational materials contained in this packet.  BE VERY CAREFUL not to take multiple medicines containing Tylenol  (also called acetaminophen ). Doing so can lead to an overdose which can damage your liver and cause liver failure and possibly death.   Follow-up instructions: Please follow-up with your primary care provider in the next 3 days for further evaluation of your symptoms.   Return instructions:  Please return to the Emergency Department if you experience worsening symptoms.  Return if you develop persistent vomiting, uncontrolled pain, high fevers. Please return if you have any other emergent concerns.  Additional Information:  Your vital signs today were: BP (!) 133/90 (BP Location: Right Arm)   Pulse 92   Temp 98.3 F (36.8 C)   Resp 18   LMP 02/26/2024 (Approximate)   SpO2 99%  If your blood pressure (BP) was elevated above 135/85 this visit, please have this repeated by your doctor within one month. --------------

## 2024-02-29 NOTE — ED Triage Notes (Signed)
 Reports R flank pain, increased urinary frequency and increased sweating for 3 days.   Also reports cough, congestion

## 2024-03-02 LAB — URINE CULTURE: Culture: >=100000 — AB

## 2024-03-03 ENCOUNTER — Telehealth (HOSPITAL_BASED_OUTPATIENT_CLINIC_OR_DEPARTMENT_OTHER): Payer: Self-pay | Admitting: *Deleted

## 2024-03-03 NOTE — Telephone Encounter (Signed)
 Post ED Visit - Positive Culture Follow-up  Culture report reviewed by antimicrobial stewardship pharmacist: [facility] [x]  [place], [place].D. []  Venetia Gully, Pharm.D., BCPS AQ-ID []  Garrel Pellecchia, Pharm.D., BCPS []  Almarie Lunger, Pharm.D., BCPS []  [place], [address].D., BCPS, AAHIVP []  Rosaline Bihari, Pharm.D., BCPS, AAHIVP []  Vernell Meier, PharmD, BCPS []  Latanya Hint, PharmD, BCPS []  Donald Medley, PharmD, BCPS []  Rocky Bold, PharmD []  Dorothyann Alert, PharmD, BCPS []  Morene Babe, PharmD  [facility] []  Rosaline Edison, PharmD []  Romona Bliss, PharmD []  Dolphus Roller, PharmD []  Veva Seip, Rph []  Vernell Daunt) Leonce, PharmD []  Eva Allis, PharmD []  Rosaline Millet, PharmD []  Iantha Batch, PharmD []  Arvin Gauss, PharmD []  Wanda Hasting, PharmD []  Ronal Rav, PharmD []  Rocky Slade, PharmD []  Bard Jeans, PharmD   Positive urine culture Treated with ciprofloxacin , organism sensitive to the same and no further patient follow-up is required at this time.  Lorita Barnie Pereyra 03/03/2024, 12:28 PM
